# Patient Record
Sex: Male | Born: 1975 | Race: Black or African American | Hispanic: No | Smoking: Current every day smoker
Health system: Southern US, Community
[De-identification: ages and names within clinical notes are randomized; demographics above are authoritative.]

## PROBLEM LIST (undated history)

## (undated) DIAGNOSIS — J45909 Unspecified asthma, uncomplicated: Secondary | ICD-10-CM

## (undated) DIAGNOSIS — F32A Depression, unspecified: Secondary | ICD-10-CM

## (undated) DIAGNOSIS — I10 Essential (primary) hypertension: Secondary | ICD-10-CM

## (undated) DIAGNOSIS — K859 Acute pancreatitis without necrosis or infection, unspecified: Secondary | ICD-10-CM

## (undated) HISTORY — DX: Depression, unspecified: F32.A

## (undated) HISTORY — DX: Unspecified asthma, uncomplicated: J45.909

## (undated) HISTORY — PX: NO PAST SURGERIES: SHX2092

---

## 1999-03-19 ENCOUNTER — Encounter: Payer: Self-pay | Admitting: Emergency Medicine

## 1999-03-19 ENCOUNTER — Emergency Department (HOSPITAL_COMMUNITY): Admission: EM | Admit: 1999-03-19 | Discharge: 1999-03-19 | Payer: Self-pay | Admitting: Emergency Medicine

## 1999-03-30 ENCOUNTER — Emergency Department (HOSPITAL_COMMUNITY): Admission: EM | Admit: 1999-03-30 | Discharge: 1999-03-30 | Payer: Self-pay | Admitting: Endocrinology

## 2000-04-12 ENCOUNTER — Emergency Department (HOSPITAL_COMMUNITY): Admission: EM | Admit: 2000-04-12 | Discharge: 2000-04-12 | Payer: Self-pay | Admitting: Emergency Medicine

## 2003-07-23 ENCOUNTER — Emergency Department (HOSPITAL_COMMUNITY): Admission: EM | Admit: 2003-07-23 | Discharge: 2003-07-23 | Payer: Self-pay | Admitting: Emergency Medicine

## 2005-06-26 ENCOUNTER — Ambulatory Visit: Payer: Self-pay | Admitting: Psychiatry

## 2005-06-26 ENCOUNTER — Inpatient Hospital Stay (HOSPITAL_COMMUNITY): Admission: RE | Admit: 2005-06-26 | Discharge: 2005-06-28 | Payer: Self-pay | Admitting: Psychiatry

## 2005-06-26 ENCOUNTER — Encounter: Payer: Self-pay | Admitting: Emergency Medicine

## 2011-09-25 ENCOUNTER — Emergency Department (HOSPITAL_COMMUNITY): Payer: Self-pay

## 2011-09-25 ENCOUNTER — Encounter: Payer: Self-pay | Admitting: *Deleted

## 2011-09-25 ENCOUNTER — Emergency Department (HOSPITAL_COMMUNITY)
Admission: EM | Admit: 2011-09-25 | Discharge: 2011-09-25 | Disposition: A | Discharge status: Home / Self Care | Payer: Self-pay | Attending: Emergency Medicine | Admitting: Emergency Medicine

## 2011-09-25 ENCOUNTER — Other Ambulatory Visit: Payer: Self-pay

## 2011-09-25 DIAGNOSIS — R109 Unspecified abdominal pain: Secondary | ICD-10-CM | POA: Insufficient documentation

## 2011-09-25 DIAGNOSIS — M549 Dorsalgia, unspecified: Secondary | ICD-10-CM | POA: Insufficient documentation

## 2011-09-25 DIAGNOSIS — R072 Precordial pain: Secondary | ICD-10-CM | POA: Insufficient documentation

## 2011-09-25 DIAGNOSIS — I10 Essential (primary) hypertension: Secondary | ICD-10-CM | POA: Insufficient documentation

## 2011-09-25 HISTORY — DX: Essential (primary) hypertension: I10

## 2011-09-25 LAB — CBC
HCT: 45.5 % (ref 39.0–52.0)
MCH: 28.6 pg (ref 26.0–34.0)
MCV: 82 fL (ref 78.0–100.0)
RBC: 5.55 MIL/uL (ref 4.22–5.81)
WBC: 8.9 10*3/uL (ref 4.0–10.5)

## 2011-09-25 LAB — CARDIAC PANEL(CRET KIN+CKTOT+MB+TROPI): Total CK: 194 U/L (ref 7–232)

## 2011-09-25 LAB — COMPREHENSIVE METABOLIC PANEL
ALT: 40 U/L (ref 0–53)
BUN: 10 mg/dL (ref 6–23)
CO2: 22 mEq/L (ref 19–32)
Calcium: 10.6 mg/dL — ABNORMAL HIGH (ref 8.4–10.5)
Creatinine, Ser: 0.82 mg/dL (ref 0.50–1.35)
GFR calc Af Amer: >90 mL/min (ref >90)
GFR calc non Af Amer: >90 mL/min (ref >90)
Glucose, Bld: 101 mg/dL — ABNORMAL HIGH (ref 70–99)

## 2011-09-25 LAB — DIFFERENTIAL
Eosinophils Relative: 2 % (ref 0–5)
Lymphocytes Relative: 30 % (ref 12–46)
Lymphs Abs: 2.7 10*3/uL (ref 0.7–4.0)
Monocytes Absolute: 0.7 10*3/uL (ref 0.1–1.0)
Monocytes Relative: 8 % (ref 3–12)

## 2011-09-25 LAB — LIPASE, BLOOD: Lipase: 95 U/L — ABNORMAL HIGH (ref 11–59)

## 2011-09-25 LAB — D-DIMER, QUANTITATIVE: D-Dimer, Quant: 0.26 ug/mL-FEU (ref 0.00–0.48)

## 2011-09-25 MED ORDER — PANTOPRAZOLE SODIUM 20 MG PO TBEC: 40.0000 mg | DELAYED_RELEASE_TABLET | Freq: Every day | ORAL | Status: DC

## 2011-09-25 MED ORDER — OXYCODONE-ACETAMINOPHEN 5-325 MG PO TABS: 2.0000 | ORAL_TABLET | ORAL | Status: AC | PRN

## 2011-09-25 MED ORDER — IOHEXOL 300 MG/ML  SOLN: 100.0000 mL | Freq: Once | INTRAMUSCULAR | Status: DC | PRN

## 2011-09-25 MED ORDER — SODIUM CHLORIDE 0.9 % IV SOLN
Freq: Once | INTRAVENOUS | Status: AC
  Administered 2011-09-25: 125 mL via INTRAVENOUS

## 2011-09-25 MED ORDER — HYDROMORPHONE HCL PF 1 MG/ML IJ SOLN
1.0000 mg | Freq: Once | INTRAMUSCULAR | Status: AC
  Administered 2011-09-25: 1 mg via INTRAVENOUS
  Filled 2011-09-25: qty 1

## 2011-09-25 MED ORDER — ONDANSETRON HCL 4 MG/2ML IJ SOLN
4.0000 mg | Freq: Once | INTRAMUSCULAR | Status: AC
  Administered 2011-09-25: 4 mg via INTRAVENOUS
  Filled 2011-09-25: qty 2

## 2011-09-25 NOTE — ED Notes (Signed)
Pt reports midsternal chest pain x 4 days. States feels as if has "knot" inside of middle chest, pain only  with deep breath, lying flat.. Pt denies radiation, shortness of breath, dizziness, nausea, vomiting, diaphoresis. Pain became worse this am while taking shower. CBG 99. Hx of hypertension.

## 2011-09-25 NOTE — ED Provider Notes (Signed)
History     CSN: [id number]  Arrival date & time 09/25/11  0729   First MD Initiated Contact with Patient 09/25/11 [phone]      Chief Complaint  Patient presents with  . Chest Pain    (Consider location/radiation/quality/duration/timing/severity/associated sxs/prior treatment) Patient is a 35 y.o. male presenting with chest pain. The history is provided by the patient.  Chest Pain    patient here with substernal chest pain that has been constant for the last 4 days. Pain is described as sharp, worse with deep breathing and radiates to his back. No syncope noted no neurological complaints. Denies any increased pain with breathing. Denies any fever or cough or leg swelling. No prior history of this in the past. No medications taken prior to arrival. He denies any dyspnea or diaphoresis associated with the symptoms  Past Medical History  Diagnosis Date  . Hypertension     No past surgical history on file.  No family history on file.  History  Substance Use Topics  . Smoking status: Current Everyday Smoker  . Smokeless tobacco: Not on file  . Alcohol Use:       Review of Systems  Cardiovascular: Positive for chest pain.  All other systems reviewed and are negative.    Allergies  Review of patient's allergies indicates no known allergies.  Home Medications  No current outpatient prescriptions on file.  BP 149/102  Pulse 73  Temp(Src) 98.2 F (36.8 C) (Oral)  Resp 16  SpO2 99%  Physical Exam  Nursing note and vitals reviewed. Constitutional: He is oriented to person, place, and time. He appears well-developed and well-nourished.  Non-toxic appearance. No distress.  HENT:  Head: Normocephalic and atraumatic.  Eyes: Conjunctivae, EOM and lids are normal. Pupils are equal, round, and reactive to light.  Neck: Normal range of motion. Neck supple. No tracheal deviation present. No mass present.  Cardiovascular: Normal rate, regular rhythm and normal heart sounds.   Exam reveals no gallop.   No murmur heard. Pulmonary/Chest: Effort normal and breath sounds normal. No stridor. No respiratory distress. He has no decreased breath sounds. He has no wheezes. He has no rhonchi. He has no rales. He exhibits tenderness and bony tenderness. He exhibits no crepitus.    Abdominal: Soft. Normal appearance and bowel sounds are normal. He exhibits no distension. There is no tenderness. There is no rebound and no CVA tenderness.  Musculoskeletal: Normal range of motion. He exhibits no edema and no tenderness.  Neurological: He is alert and oriented to person, place, and time. He has normal strength. No cranial nerve deficit or sensory deficit. GCS eye subscore is 4. GCS verbal subscore is 5. GCS motor subscore is 6.  Skin: Skin is warm and dry. No abrasion and no rash noted.  Psychiatric: He has a normal mood and affect. His speech is normal and behavior is normal.    ED Course  Procedures (including critical care time)   Labs Reviewed  CBC  DIFFERENTIAL  COMPREHENSIVE METABOLIC PANEL  LIPASE, BLOOD  D-DIMER, QUANTITATIVE  CARDIAC PANEL(CRET KIN+CKTOT+MB+TROPI)   No results found.   No diagnosis found.    MDM  Patient given IV fluids and pain medication. No concern for ACS laboratory studies are negative. Patient had elevated lipase abdominal CT negative pancreatitis. Patient does admit to heavy EtOH use. Suspect patient's gastritis. D-dimer negative no concern for PE will place patient on pain medication PPI and discharge        Toy Baker,  MD 09/25/11 1057

## 2012-12-21 IMAGING — CR DG CHEST 2V
2 series · 2 of 2 positions shown · non-contrast
Comparison: None.

CLINICAL DATA: Chest pain

CHEST - 2 VIEW

[w chest pa]
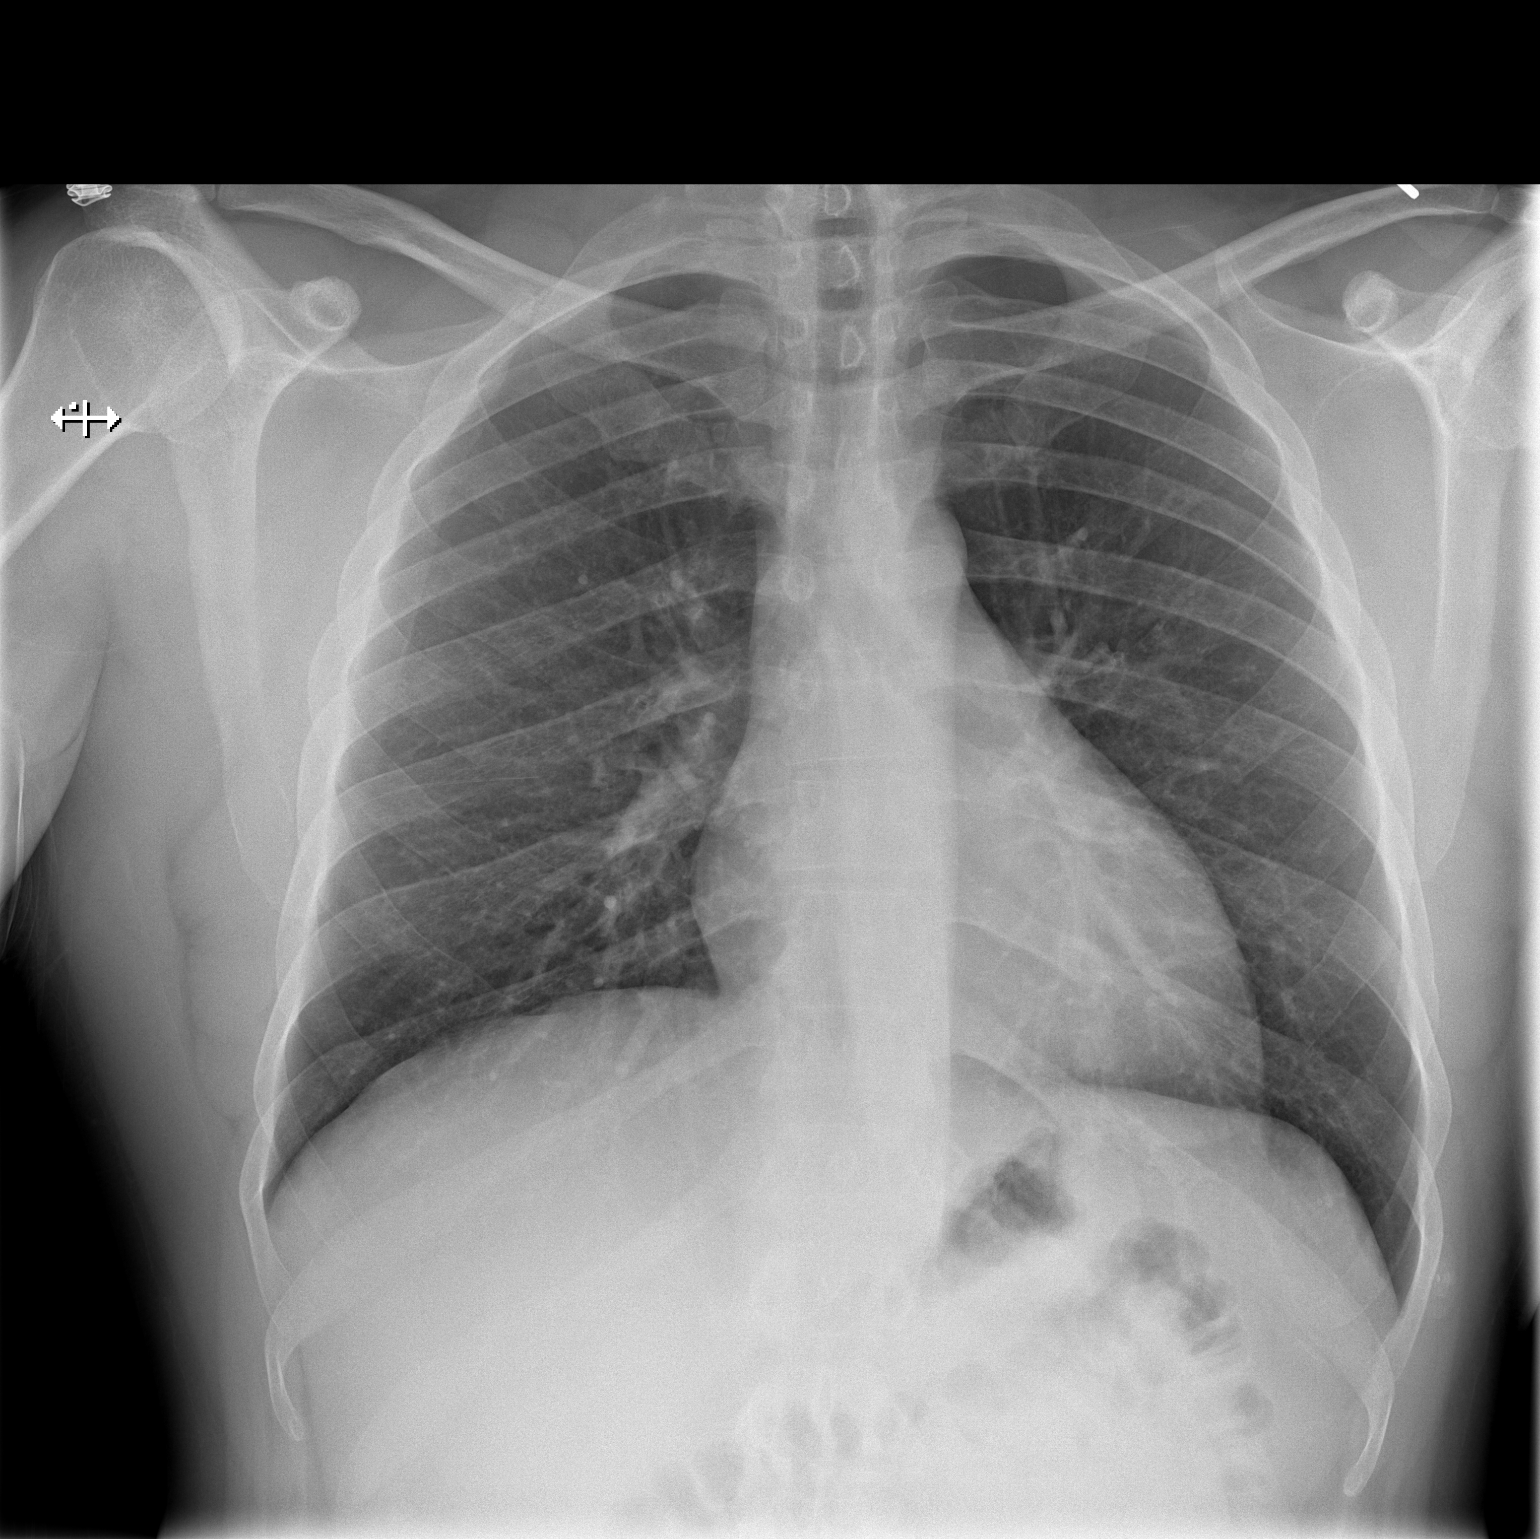

[w chest lat]
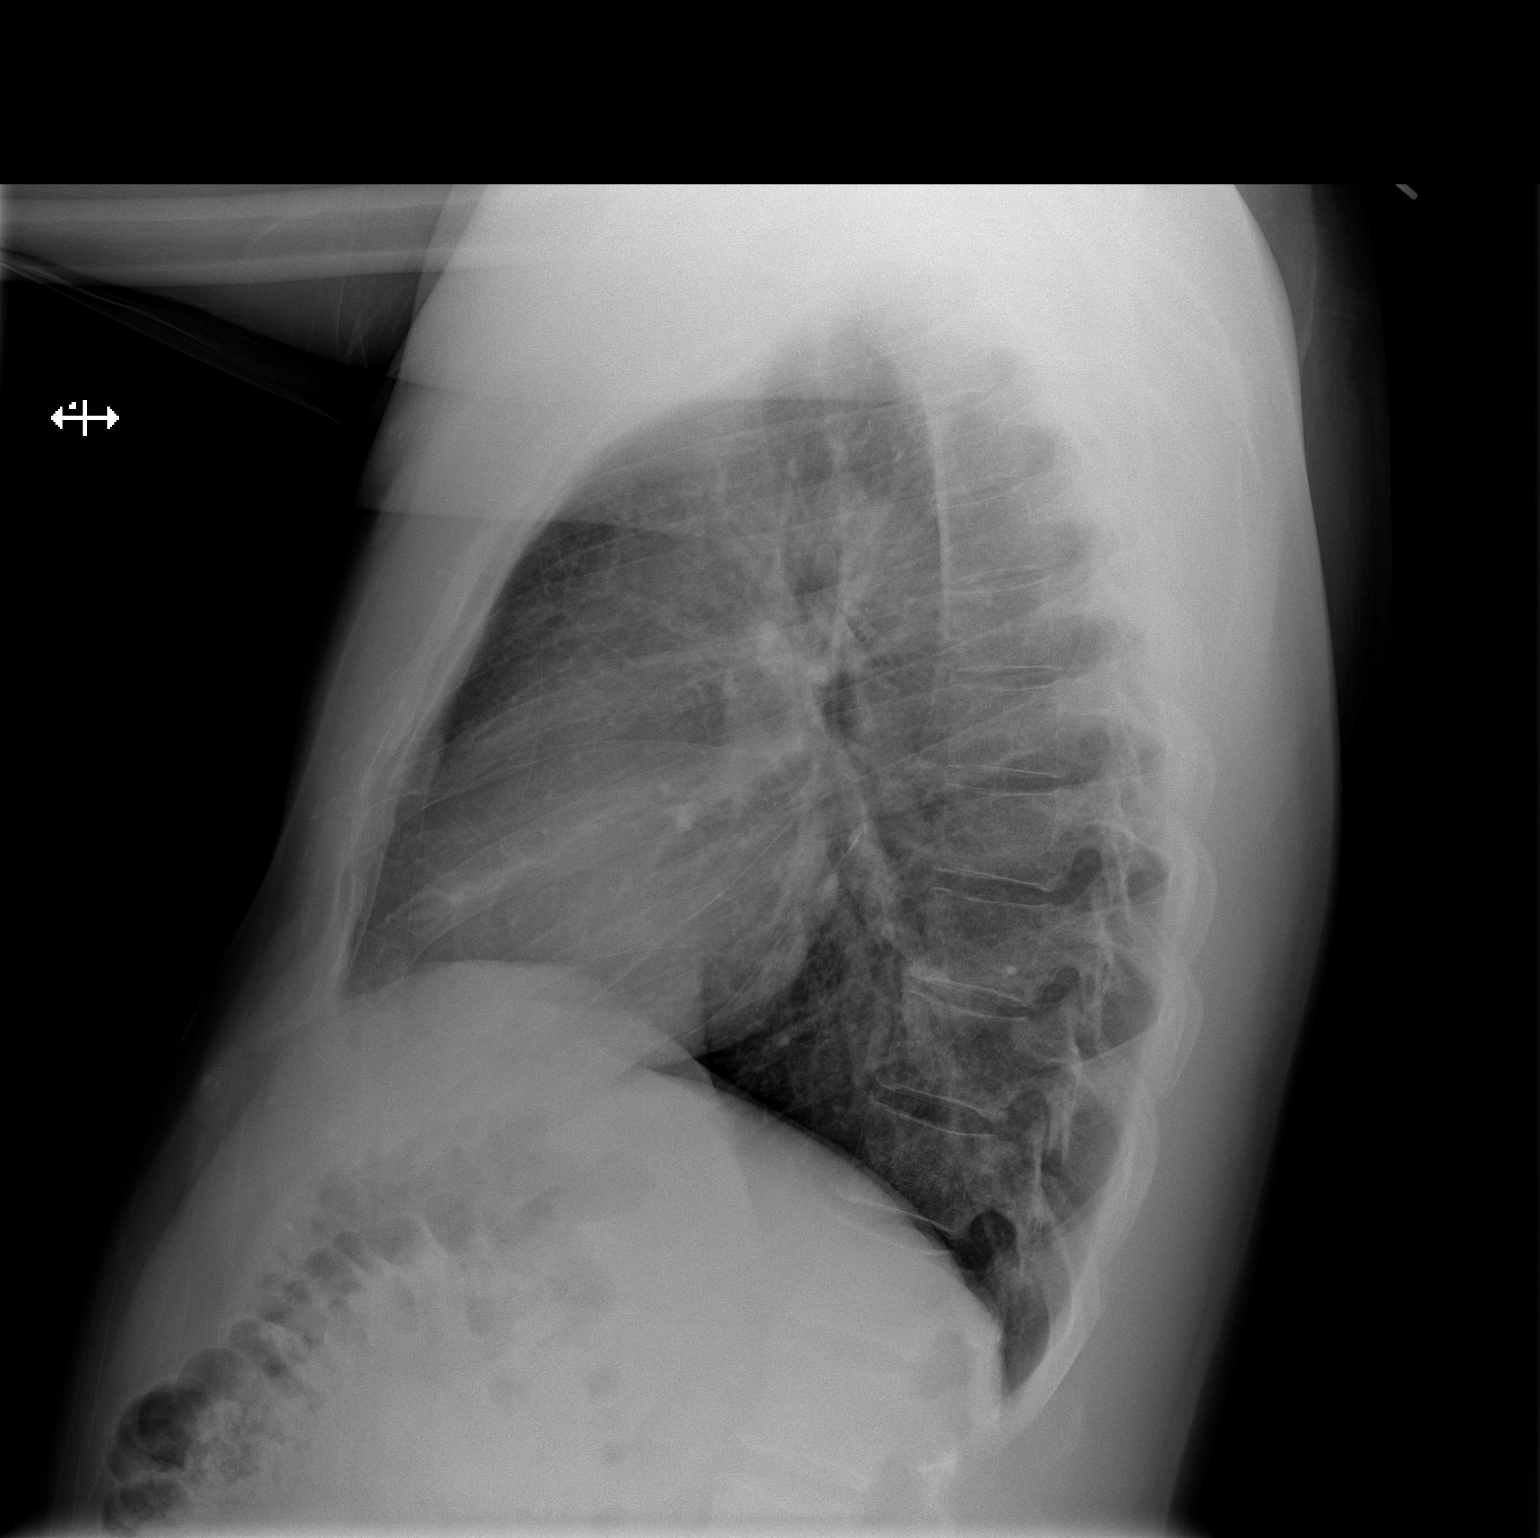

[2 of 2 positions shown; findings below may reference images not displayed]

FINDINGS: The heart size and mediastinal contours are within
normal limits.  Both lungs are clear.  The visualized skeletal
structures are unremarkable.
IMPRESSION: No active cardiopulmonary disease.

## 2012-12-21 IMAGING — CT CT ABD-PELV W/ CM
1 series · 16 of 32 positions shown, 20 images · IV contrast (APPLIED)
Comparison: There

CLINICAL DATA: Chest and abdominal pain

CT ABDOMEN AND PELVIS WITH CONTRAST
TECHNIQUE: Multidetector CT imaging of the abdomen and pelvis was
performed following the standard protocol during bolus
administration of intravenous contrast.
Contrast:  100 ml

[Series 2: abd/pelv with 5.0 b31f st · axial · 0.70mm/px · z∈[-380,+6]mm · 16 of 86 slices shown, 20 images]
[im 6/86  soft-tissue]
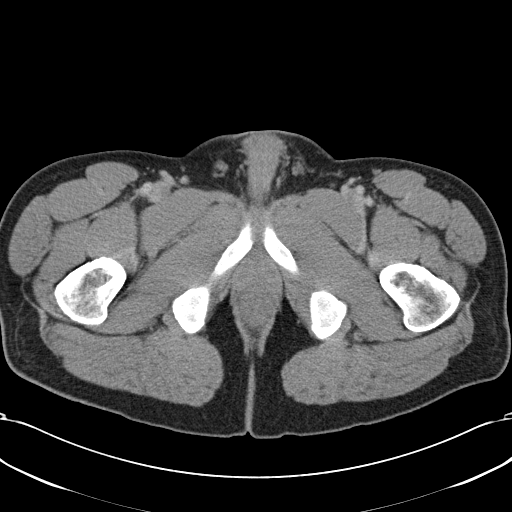
[im 6/86  bone]
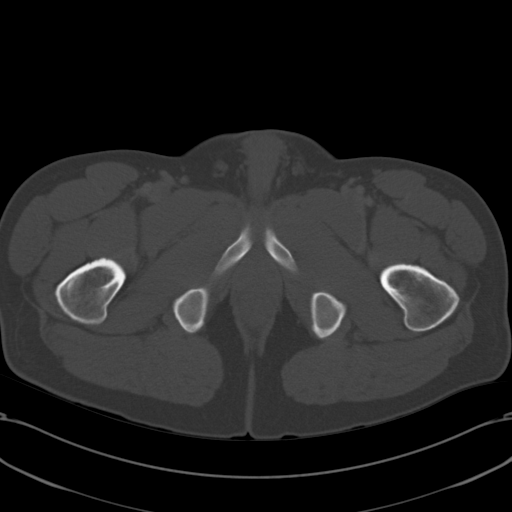
[im 11/86  soft-tissue]
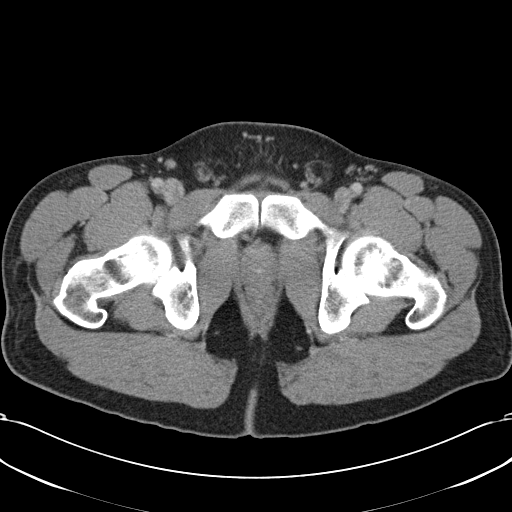
[im 17/86  soft-tissue]
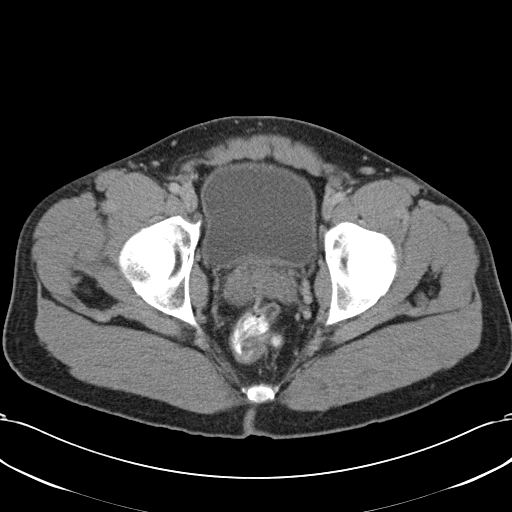
[im 22/86  soft-tissue]
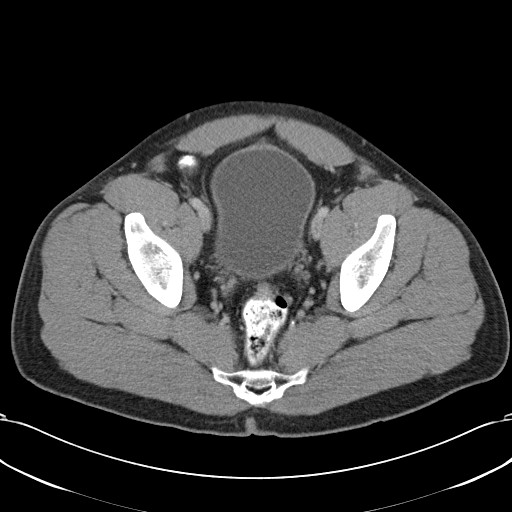
[im 28/86  soft-tissue]
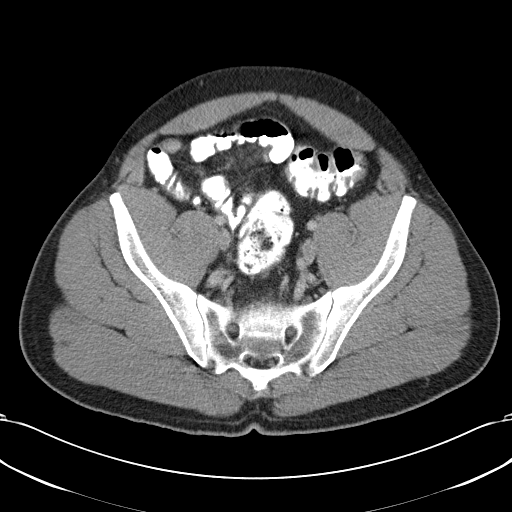
[im 33/86  soft-tissue]
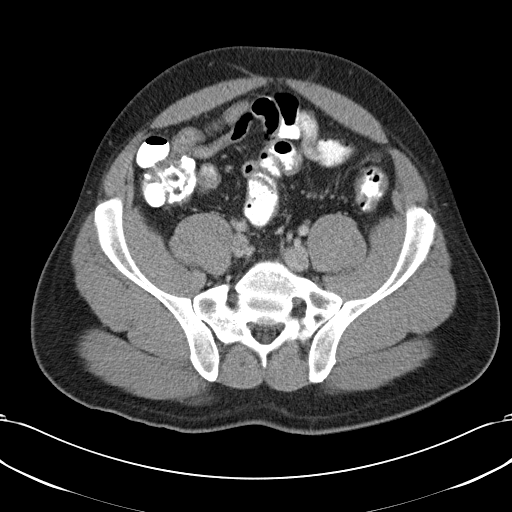
[im 39/86  soft-tissue]
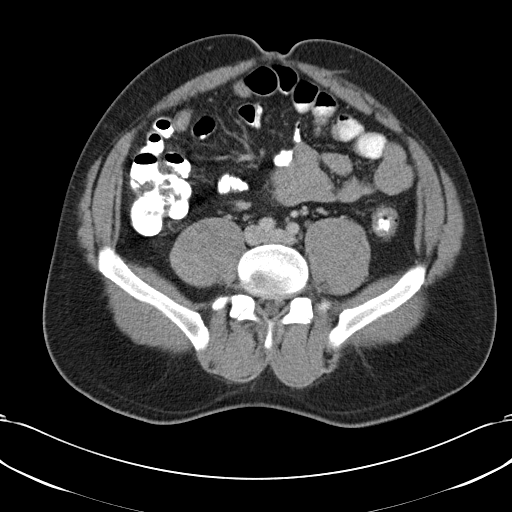
[im 47/86  soft-tissue]
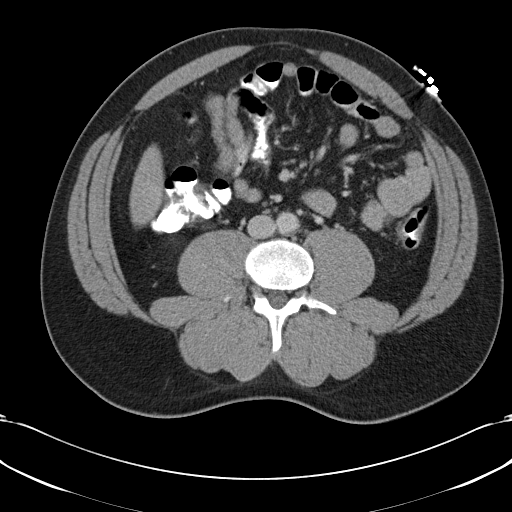
[im 53/86  soft-tissue]
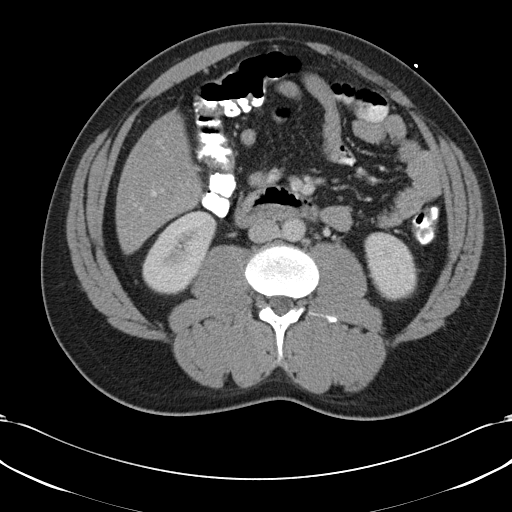
[im 53/86  bone]
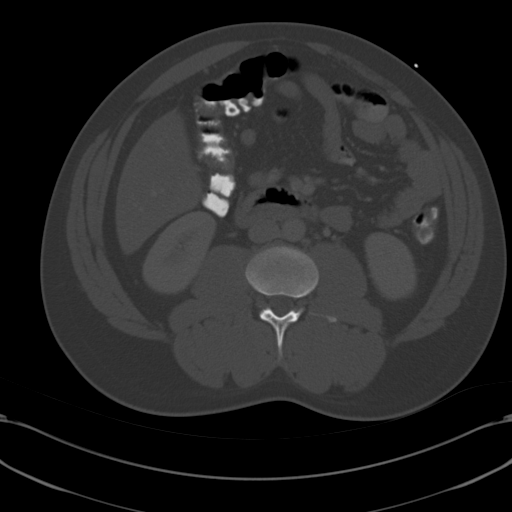
[im 58/86  soft-tissue]
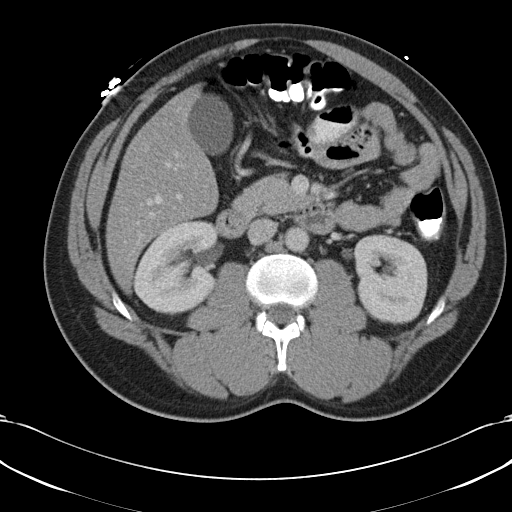
[im 64/86  soft-tissue]
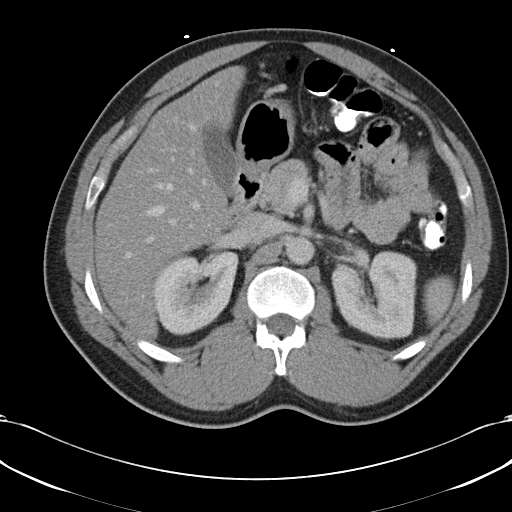
[im 69/86  soft-tissue]
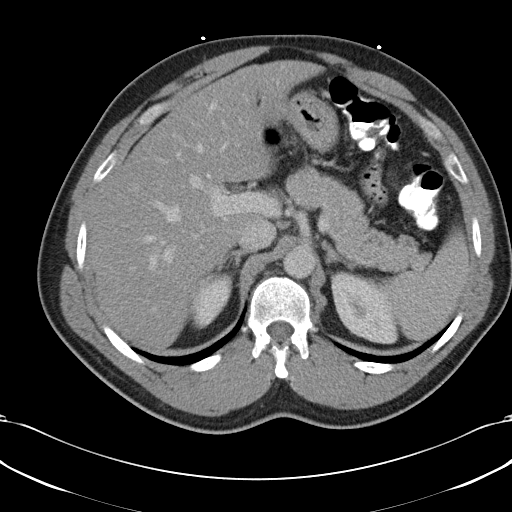
[im 75/86  soft-tissue]
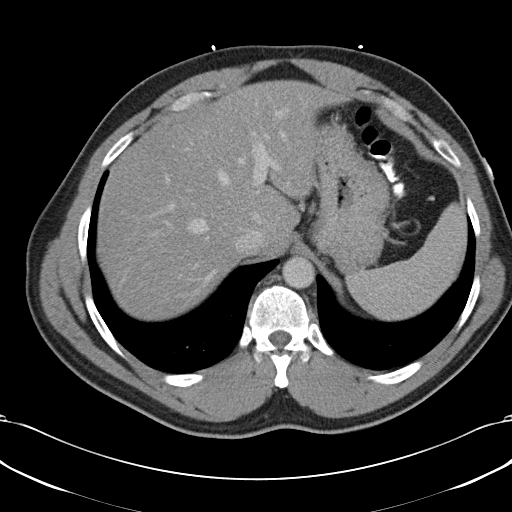
[im 75/86  lung]
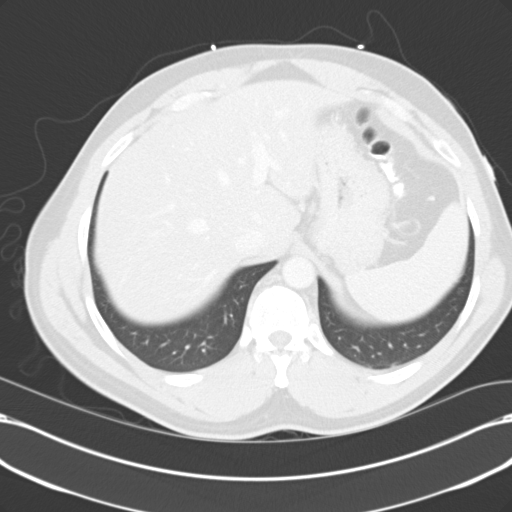
[im 77/86  lung]
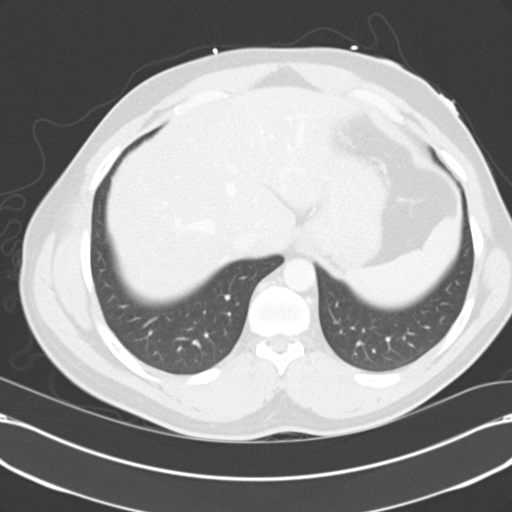
[im 80/86  soft-tissue]
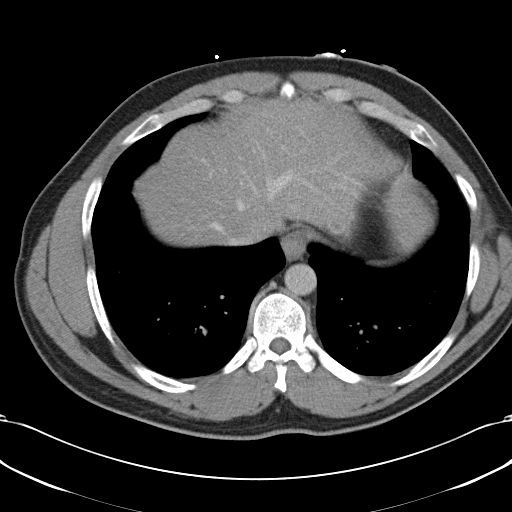
[im 80/86  lung]
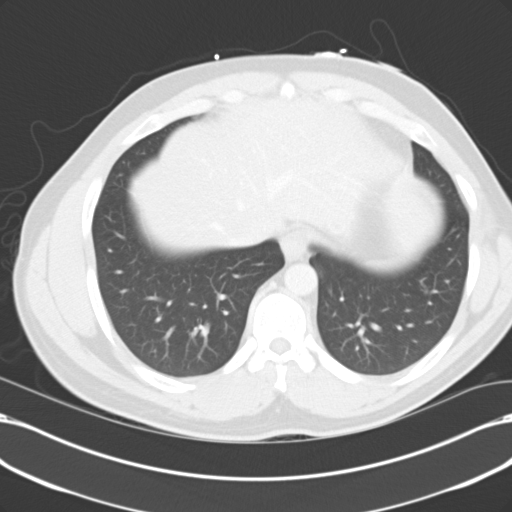
[im 83/86  lung]
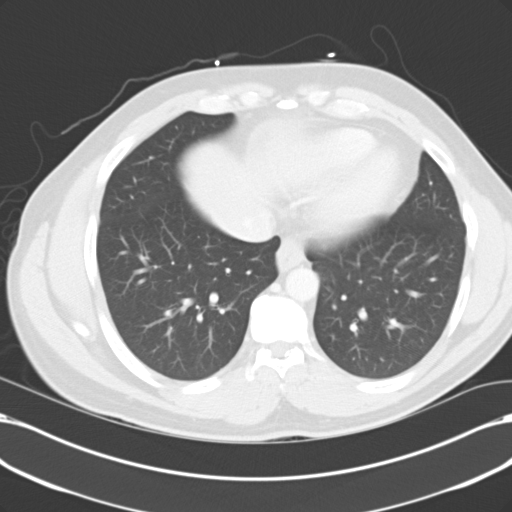

[16 of 32 positions shown; findings below may reference images not displayed]

FINDINGS: Lung bases are clear.  No pleural or pericardial fluid.
The liver shows diffuse fatty change but there is no focal lesion
or biliary ductal dilatation.  No calcified gallstones.  The spleen
is normal.  The pancreas is normal.  The adrenal glands are normal.
The kidneys are normal.  No cyst, mass, stone or hydronephrosis.
The aorta shows mild atherosclerotic change but no aneurysm.  The
IVC is normal.  No retroperitoneal mass or adenopathy.  No free
intraperitoneal fluid or air.  No primary bowel pathology is
discernible.  The appendix appears normal.  Bladder, prostate gland
and seminal vesicles are unremarkable.  No significant osseous
finding.
IMPRESSION: Fatty liver. Early atherosclerotic change of the aorta.  Otherwise
normal scan.

## 2014-02-27 ENCOUNTER — Encounter (HOSPITAL_COMMUNITY): Payer: Self-pay | Admitting: Emergency Medicine

## 2014-02-27 ENCOUNTER — Emergency Department (HOSPITAL_COMMUNITY): Payer: Self-pay

## 2014-02-27 ENCOUNTER — Emergency Department (HOSPITAL_COMMUNITY)
Admission: EM | Admit: 2014-02-27 | Discharge: 2014-02-27 | Disposition: A | Discharge status: Home / Self Care | Payer: Self-pay | Attending: Emergency Medicine | Admitting: Emergency Medicine

## 2014-02-27 DIAGNOSIS — Z8719 Personal history of other diseases of the digestive system: Secondary | ICD-10-CM | POA: Insufficient documentation

## 2014-02-27 DIAGNOSIS — R079 Chest pain, unspecified: Secondary | ICD-10-CM | POA: Insufficient documentation

## 2014-02-27 DIAGNOSIS — Z79899 Other long term (current) drug therapy: Secondary | ICD-10-CM | POA: Insufficient documentation

## 2014-02-27 DIAGNOSIS — I10 Essential (primary) hypertension: Secondary | ICD-10-CM | POA: Insufficient documentation

## 2014-02-27 DIAGNOSIS — F17200 Nicotine dependence, unspecified, uncomplicated: Secondary | ICD-10-CM | POA: Insufficient documentation

## 2014-02-27 HISTORY — DX: Acute pancreatitis without necrosis or infection, unspecified: K85.90

## 2014-02-27 LAB — CBC
HCT: 43.2 % (ref 39.0–52.0)
HEMOGLOBIN: 14.6 g/dL (ref 13.0–17.0)
MCH: 28 pg (ref 26.0–34.0)
MCHC: 33.8 g/dL (ref 30.0–36.0)
MCV: 82.9 fL (ref 78.0–100.0)
PLATELETS: 178 10*3/uL (ref 150–400)
RBC: 5.21 MIL/uL (ref 4.22–5.81)
RDW: 13.2 % (ref 11.5–15.5)
WBC: 8.2 10*3/uL (ref 4.0–10.5)

## 2014-02-27 LAB — BASIC METABOLIC PANEL
BUN: 6 mg/dL (ref 6–23)
CALCIUM: 8.7 mg/dL (ref 8.4–10.5)
CO2: 20 meq/L (ref 19–32)
Chloride: 109 mEq/L (ref 96–112)
Creatinine, Ser: 0.81 mg/dL (ref 0.50–1.35)
GFR calc Af Amer: >90 mL/min (ref >90)
GLUCOSE: 95 mg/dL (ref 70–99)
POTASSIUM: 4.3 meq/L (ref 3.7–5.3)
SODIUM: 147 meq/L (ref 137–147)

## 2014-02-27 LAB — I-STAT TROPONIN, ED
TROPONIN I, POC: 0.01 ng/mL (ref 0.00–0.08)
TROPONIN I, POC: 0.01 ng/mL (ref 0.00–0.08)

## 2014-02-27 NOTE — ED Provider Notes (Signed)
CSN: [id number]     Arrival date & time 02/27/14  [id number] History   First MD Initiated Contact with Patient 02/27/14 [phone]     Chief Complaint  Patient presents with  . Chest Pain    (Consider location/radiation/quality/duration/timing/severity/associated sxs/prior Treatment) HPI Comments: Patient with history of hypertension presents with complaint of chest pain which began approximately 2 hours prior to arrival. Pain began acutely during an argument. Patient describes the pain as a sharp pain in the left side of his chest with associated pressure. Patient had some tingling in his left hand as well that was short-lived. EMS was called and patient was transported to the hospital. He received one nitroglycerin and 324 mg of aspirin prior to arrival. Pain improved and is now resolved. Patient denies associated shortness of breath, nausea, diaphoresis, palpitations. No history of hyperlipidemia, diabetes. Patient is a smoker. He denies cocaine or other stimulant use. No known family history of CAD or MI. DM runs in the family. Nothing makes symptoms better or worse.  Patient is a 38 y.o. male presenting with chest pain. The history is provided by the patient and medical records.  Chest Pain Associated symptoms: no abdominal pain, no back pain, no cough, no diaphoresis, no fever, no nausea, no palpitations, no shortness of breath and not vomiting     Past Medical History  Diagnosis Date  . Hypertension   . Pancreatitis    History reviewed. No pertinent past surgical history. History reviewed. No pertinent family history. History  Substance Use Topics  . Smoking status: Current Every Day Smoker    Types: Cigarettes  . Smokeless tobacco: Not on file  . Alcohol Use: Yes    Review of Systems  Constitutional: Negative for fever and diaphoresis.  Eyes: Negative for redness.  Respiratory: Negative for cough and shortness of breath.   Cardiovascular: Positive for chest pain. Negative for  palpitations and leg swelling.  Gastrointestinal: Negative for nausea, vomiting and abdominal pain.  Genitourinary: Negative for dysuria.  Musculoskeletal: Negative for back pain and neck pain.  Skin: Negative for rash.  Neurological: Negative for syncope and light-headedness.    Allergies  Review of patient's allergies indicates no known allergies.  Home Medications   Prior to Admission medications   Medication Sig Start Date End Date Taking? Authorizing Provider  pantoprazole (PROTONIX) 20 MG tablet Take 2 tablets (40 mg total) by mouth daily. 09/25/11 09/24/12  Toy BakerAnthony T Allen, MD   BP 136/92  Pulse 91  Temp(Src) 98.4 F (36.9 C) (Oral)  Resp 19  SpO2 98%  Physical Exam  Nursing note and vitals reviewed. Constitutional: He appears well-developed and well-nourished.  HENT:  Head: Normocephalic and atraumatic.  Mouth/Throat: Mucous membranes are normal. Mucous membranes are not dry.  Eyes: Conjunctivae are normal.  Neck: Trachea normal and normal range of motion. Neck supple. Normal carotid pulses and no JVD present. No muscular tenderness present. Carotid bruit is not present. No tracheal deviation present.  Cardiovascular: Normal rate, regular rhythm, S1 normal, S2 normal, normal heart sounds and intact distal pulses.  Exam reveals no distant heart sounds and no decreased pulses.   No murmur heard. Pulmonary/Chest: Effort normal and breath sounds normal. No respiratory distress. He has no wheezes. He exhibits no tenderness.  Abdominal: Soft. Normal aorta and bowel sounds are normal. There is no tenderness. There is no rebound and no guarding.  Musculoskeletal: He exhibits no edema.  Neurological: He is alert.  Skin: Skin is warm and dry. He is not  diaphoretic. No cyanosis. No pallor.  Psychiatric: He has a normal mood and affect.    ED Course  Procedures (including critical care time) Labs Review Labs Reviewed  CBC  BASIC METABOLIC PANEL  I-STAT TROPOININ, ED   Rosezena Sensor, ED    Imaging Review Dg Chest 2 View  02/27/2014   CLINICAL DATA:  Left-sided chest pain  EXAM: CHEST  2 VIEW  COMPARISON:  09/25/2011  FINDINGS: The heart size and mediastinal contours are within normal limits. Both lungs are clear. The visualized skeletal structures are unremarkable.  IMPRESSION: No active cardiopulmonary disease.   Electronically Signed   By: Elige Ko   On: 02/27/2014 09:19     EKG Interpretation   Date/Time:  Sunday Feb 27 2014 08:17:47 EDT Ventricular Rate:  86 PR Interval:  134 QRS Duration: 93 QT Interval:  354 QTC Calculation: 423 R Axis:   85 Text Interpretation:  Sinus rhythm ST elev, probable normal early repol  pattern No significant change was found Confirmed by CAMPOS  MD, KEVIN  ([id number]) on 02/27/2014 8:42:12 AM      8:34 AM Patient seen and examined. Work-up initiated. ASA given prior. EKG reviewed.   Vital signs reviewed and are as follows: Filed Vitals:   02/27/14 0819  BP: 136/92  Pulse: 91  Temp: 98.4 F (36.9 C)  Resp: 19   Patient stable during ED stay. Trop neg x 2. No return of CP. Doubt cardiac etiology.   Patient was counseled to return with severe chest pain, especially if the pain is crushing or pressure-like and spreads to the arms, back, neck, or jaw, or if they have sweating, nausea, or shortness of breath with the pain. They were encouraged to call 911 with these symptoms.   They were also told to return if their chest pain gets worse and does not go away with rest, they have an attack of chest pain lasting longer than usual despite rest and treatment with the medications their caregiver has prescribed, if they wake from sleep with chest pain or shortness of breath, if they feel dizzy or faint, if they have chest pain not typical of their usual pain, or if they have any other emergent concerns regarding their health.  The patient verbalized understanding and agreed.     MDM   Final diagnoses:  Chest  pain   Patient with chest tightness during an argument. Resolved shortly after arrival. CXR clear. Feel patient is low risk for ACS given history (poor story for ACS/MI), negative troponin(s) x2, normal/unchanged EKG. Do not suspect PE. PCP referral given and patient encouraged to followup. Appropriate return instructions discussed with patient and he agrees and seems reliable to return if worsening.  No dangerous or life-threatening conditions suspected or identified by history, physical exam, and by work-up. No indications for hospitalization identified.       Renne Crigler, PA-C 02/27/14 1226

## 2014-02-27 NOTE — ED Provider Notes (Signed)
Medical screening examination/treatment/procedure(s) were performed by non-physician practitioner and as supervising physician I was immediately available for consultation/collaboration.   EKG Interpretation   Date/Time:  Sunday Feb 27 2014 08:17:47 EDT Ventricular Rate:  86 PR Interval:  134 QRS Duration: 93 QT Interval:  354 QTC Calculation: 423 R Axis:   85 Text Interpretation:  Sinus rhythm ST elev, probable normal early repol  pattern No significant change was found Confirmed by Harrold Fitchett  MD, Nahara Dona  ([id number]) on 02/27/2014 8:42:12 AM        Lyanne Co, MD 02/27/14 1343

## 2014-02-27 NOTE — ED Notes (Signed)
Pt having argument with gf. Developed sudden onset CP. No nausea/vomiting/diaphoresis. EMS gave 1 nitro and 324 ASA with relief. Pain went from 7 to a 3/10. BP 160/100 and now is 136/94. ST HR 100's.

## 2014-02-27 NOTE — Discharge Instructions (Signed)
Please read and follow all provided instructions.  Your diagnoses today include:  1. Chest pain     Tests performed today include:  An EKG of your heart  A chest x-ray  Cardiac enzymes - a blood test for heart muscle damage  Blood counts and electrolytes  Vital signs. See below for your results today.   Medications prescribed:   None  Take any prescribed medications only as directed.  Follow-up instructions: Please follow-up with your primary care provider as soon as you can for further evaluation of your symptoms. If you do not have a primary care doctor -- see below for referral information.   Return instructions:  SEEK IMMEDIATE MEDICAL ATTENTION IF:  You have severe chest pain, especially if the pain is crushing or pressure-like and spreads to the arms, back, neck, or jaw, or if you have sweating, nausea (feeling sick to your stomach), or shortness of breath. THIS IS AN EMERGENCY. Don't wait to see if the pain will go away. Get medical help at once. Call 911 or 0 (operator). DO NOT drive yourself to the hospital.   Your chest pain gets worse and does not go away with rest.   You have an attack of chest pain lasting longer than usual, despite rest and treatment with the medications your caregiver has prescribed.   You wake from sleep with chest pain or shortness of breath.  You feel dizzy or faint.  You have chest pain not typical of your usual pain for which you originally saw your caregiver.   You have any other emergent concerns regarding your health.  Additional Information: Chest pain comes from many different causes. Your caregiver has diagnosed you as having chest pain that is not specific for one problem, but does not require admission.  You are at low risk for an acute heart condition or other serious illness.   Your vital signs today were: BP 142/87   Pulse 89   Temp(Src) 98.4 F (36.9 C) (Oral)   Resp 19   SpO2 98% If your blood pressure (BP) was  elevated above 135/85 this visit, please have this repeated by your doctor within one month. --------------

## 2019-05-04 ENCOUNTER — Emergency Department (HOSPITAL_COMMUNITY)
Admission: EM | Admit: 2019-05-04 | Discharge: 2019-05-04 | Disposition: A | Discharge status: Home / Self Care | Payer: Self-pay | Attending: Emergency Medicine | Admitting: Emergency Medicine

## 2019-05-04 ENCOUNTER — Other Ambulatory Visit: Payer: Self-pay

## 2019-05-04 ENCOUNTER — Encounter (HOSPITAL_COMMUNITY): Payer: Self-pay

## 2019-05-04 DIAGNOSIS — R112 Nausea with vomiting, unspecified: Secondary | ICD-10-CM

## 2019-05-04 DIAGNOSIS — F17210 Nicotine dependence, cigarettes, uncomplicated: Secondary | ICD-10-CM | POA: Insufficient documentation

## 2019-05-04 DIAGNOSIS — R1084 Generalized abdominal pain: Secondary | ICD-10-CM | POA: Insufficient documentation

## 2019-05-04 DIAGNOSIS — I10 Essential (primary) hypertension: Secondary | ICD-10-CM | POA: Insufficient documentation

## 2019-05-04 DIAGNOSIS — R1013 Epigastric pain: Secondary | ICD-10-CM | POA: Insufficient documentation

## 2019-05-04 LAB — COMPREHENSIVE METABOLIC PANEL
ALT: 132 U/L — ABNORMAL HIGH (ref 0–44)
AST: 89 U/L — ABNORMAL HIGH (ref 15–41)
Albumin: 4.8 g/dL (ref 3.5–5.0)
Alkaline Phosphatase: 93 U/L (ref 38–126)
Anion gap: 17 — ABNORMAL HIGH (ref 5–15)
BUN: 14 mg/dL (ref 6–20)
CO2: 23 mmol/L (ref 22–32)
Calcium: 10.6 mg/dL — ABNORMAL HIGH (ref 8.9–10.3)
Chloride: 96 mmol/L — ABNORMAL LOW (ref 98–111)
Creatinine, Ser: 0.83 mg/dL (ref 0.61–1.24)
GFR calc Af Amer: >60 mL/min (ref >60)
GFR calc non Af Amer: >60 mL/min (ref >60)
Glucose, Bld: 119 mg/dL — ABNORMAL HIGH (ref 70–99)
Potassium: 4.6 mmol/L (ref 3.5–5.1)
Sodium: 136 mmol/L (ref 135–145)
Total Bilirubin: 1.3 mg/dL — ABNORMAL HIGH (ref 0.3–1.2)
Total Protein: 9 g/dL — ABNORMAL HIGH (ref 6.5–8.1)

## 2019-05-04 LAB — CBC
HCT: 51.3 % (ref 39.0–52.0)
Hemoglobin: 16.6 g/dL (ref 13.0–17.0)
MCH: 28.4 pg (ref 26.0–34.0)
MCHC: 32.4 g/dL (ref 30.0–36.0)
MCV: 87.7 fL (ref 80.0–100.0)
Platelets: 191 10*3/uL (ref 150–400)
RBC: 5.85 MIL/uL — ABNORMAL HIGH (ref 4.22–5.81)
RDW: 13.4 % (ref 11.5–15.5)
WBC: 12.5 10*3/uL — ABNORMAL HIGH (ref 4.0–10.5)
nRBC: 0 % (ref 0.0–0.2)

## 2019-05-04 LAB — URINALYSIS, ROUTINE W REFLEX MICROSCOPIC
Bilirubin Urine: NEGATIVE
Glucose, UA: NEGATIVE mg/dL
Ketones, ur: 80 mg/dL — AB
Leukocytes,Ua: NEGATIVE
Nitrite: NEGATIVE
Protein, ur: >=300 mg/dL — AB
Specific Gravity, Urine: 1.035 — ABNORMAL HIGH (ref 1.005–1.030)
pH: 6 (ref 5.0–8.0)

## 2019-05-04 LAB — LIPASE, BLOOD: Lipase: 35 U/L (ref 11–51)

## 2019-05-04 MED ORDER — ONDANSETRON 8 MG PO TBDP
8.0000 mg | ORAL_TABLET | Freq: Once | ORAL | Status: AC
  Administered 2019-05-04: 14:00:00 8 mg via ORAL
  Filled 2019-05-04: qty 1

## 2019-05-04 MED ORDER — SODIUM CHLORIDE 0.9% FLUSH: 3.0000 mL | Freq: Once | INTRAVENOUS | Status: DC

## 2019-05-04 MED ORDER — LACTATED RINGERS IV BOLUS
2000.0000 mL | Freq: Once | INTRAVENOUS | Status: AC
  Administered 2019-05-04: 14:00:00 2000 mL via INTRAVENOUS

## 2019-05-04 MED ORDER — ONDANSETRON 8 MG PO TBDP: 8.0000 mg | ORAL_TABLET | Freq: Three times a day (TID) | ORAL | 0 refills | Status: DC | PRN

## 2019-05-04 NOTE — ED Provider Notes (Signed)
[facility]-EMERGENCY DEPT Provider Note   CSN: [id number] Arrival date & time: 05/04/19  1000     History   Chief Complaint Chief Complaint  Patient presents with  . Abdominal Pain    HPI Travis Leach is a 43 y.o. male.     HPI  43 year old male comes in a chief complaint of abdominal pain. Patient has history of hypertension, pancreatitis.  He also has history of heavy alcohol use per wife.  He comes in with chief complaint of nausea and vomiting that started yesterday.  He had associated abdominal discomfort that is located mostly in the epigastric region.  The pain is intermittent.  Patient has had nausea without vomiting.  He denies any diarrhea, cough, chest pain.  He states that his symptoms started 2 days after him and his wife ate outside.  She too has nausea but it is not as severe.  Patient gets emesis anytime he eats anything.  He denies any sick exposures.  Past Medical History:  Diagnosis Date  . Hypertension   . Pancreatitis     There are no active problems to display for this patient.   History reviewed. No pertinent surgical history.      Home Medications    Prior to Admission medications   Medication Sig Start Date End Date Taking? Authorizing Provider  ibuprofen (ADVIL) 200 MG tablet Take 400 mg by mouth every 6 (six) hours as needed for moderate pain.   Yes [provider]  ondansetron (ZOFRAN ODT) 8 MG disintegrating tablet Take 1 tablet (8 mg total) by mouth every 8 (eight) hours as needed for nausea. 05/04/19   Derwood KaplanNanavati, Jesseka Drinkard, MD  pantoprazole (PROTONIX) 20 MG tablet Take 2 tablets (40 mg total) by mouth daily. Patient not taking: Reported on 05/04/2019 09/25/11 09/24/12  Lorre NickAllen, Anthony, MD    Family History No family history on file.  Social History Social History   Tobacco Use  . Smoking status: Current Every Day Smoker    Types: Cigarettes  Substance Use Topics  . Alcohol use: Yes  . Drug use: Yes   Types: Marijuana     Allergies   Patient has no known allergies.   Review of Systems Review of Systems  Constitutional: Positive for activity change.  Respiratory: Negative for shortness of breath.   Cardiovascular: Negative for chest pain.  Gastrointestinal: Positive for abdominal pain, nausea and vomiting. Negative for diarrhea.  Allergic/Immunologic: Negative for immunocompromised state.  Hematological: Does not bruise/bleed easily.     Physical Exam Updated Vital Signs BP (!) 187/123   Pulse 100   Temp 99.2 F (37.3 C) (Oral)   Resp 16   Wt 68.5 kg   SpO2 97%   Physical Exam Vitals signs and nursing note reviewed.  Constitutional:      Appearance: He is well-developed.  HENT:     Head: Atraumatic.  Neck:     Musculoskeletal: Neck supple.  Cardiovascular:     Rate and Rhythm: Normal rate.  Pulmonary:     Effort: Pulmonary effort is normal.  Abdominal:     Palpations: Abdomen is soft.     Tenderness: There is generalized abdominal tenderness and tenderness in the epigastric area, periumbilical area and left upper quadrant. There is no guarding or rebound. Negative signs include Murphy's sign and McBurney's sign.  Skin:    General: Skin is warm.  Neurological:     Mental Status: He is alert and oriented to person, place, and time.  ED Treatments / Results  Labs (all labs ordered are listed, but only abnormal results are displayed) Labs Reviewed  COMPREHENSIVE METABOLIC PANEL - Abnormal; Notable for the following components:      Result Value   Chloride 96 (*)    Glucose, Bld 119 (*)    Calcium 10.6 (*)    Total Protein 9.0 (*)    AST 89 (*)    ALT 132 (*)    Total Bilirubin 1.3 (*)    Anion gap 17 (*)    All other components within normal limits  CBC - Abnormal; Notable for the following components:   WBC 12.5 (*)    RBC 5.85 (*)    All other components within normal limits  URINALYSIS, ROUTINE W REFLEX MICROSCOPIC - Abnormal; Notable for  the following components:   Color, Urine AMBER (*)    APPearance HAZY (*)    Specific Gravity, Urine 1.035 (*)    Hgb urine dipstick MODERATE (*)    Ketones, ur 80 (*)    Protein, ur >=300 (*)    Bacteria, UA RARE (*)    All other components within normal limits  NOVEL CORONAVIRUS, NAA (HOSPITAL ORDER, SEND-OUT TO REF LAB)  LIPASE, BLOOD    EKG None  Radiology No results found.  Procedures Procedures (including critical care time)  Medications Ordered in ED Medications  lactated ringers bolus 2,000 mL (2,000 mLs Intravenous New Bag/Given 05/04/19 1340)  ondansetron (ZOFRAN-ODT) disintegrating tablet 8 mg (8 mg Oral Given 05/04/19 1339)     Initial Impression / Assessment and Plan / ED Course  I have reviewed the triage vital signs and the nursing notes.  Pertinent labs & imaging results that were available during my care of the patient were reviewed by me and considered in my medical decision making (see chart for details).        43 year old comes in a chief complaint of nausea, vomiting, abdominal pain.  Symptoms have been going on for 3 days.  He only has emesis after he eats.  He is having diffuse tenderness without any diarrhea.  No COVID-19-like symptoms besides the GI issues.  Unclear why he is having the symptoms.  LFTs are slightly elevated.  I ordered COVID-19 test, patient declined.  Wife informs me that patient does drink heavily, which could have contributed to the elevated LFTs.  Patient has passed oral challenge. He has some ketonuria but otherwise no signs of advanced dehydration.   Travis Leach was evaluated in Emergency Department on 05/04/2019 for the symptoms described in the history of present illness. He was evaluated in the context of the global COVID-19 pandemic, which necessitated consideration that the patient might be at risk for infection with the SARS-CoV-2 virus that causes COVID-19. Institutional protocols and algorithms that pertain to the  evaluation of patients at risk for COVID-19 are in a state of rapid change based on information released by regulatory bodies including the CDC and federal and state organizations. These policies and algorithms were followed during the patient's care in the ED.   Final Clinical Impressions(s) / ED Diagnoses   Final diagnoses:  Generalized abdominal pain  Nausea and vomiting, intractability of vomiting not specified, unspecified vomiting type    ED Discharge Orders         Ordered    ondansetron (ZOFRAN ODT) 8 MG disintegrating tablet  Every 8 hours PRN     05/04/19 1535           Guhan Bruington,  MD 05/04/19 1603

## 2019-05-04 NOTE — ED Notes (Signed)
Patient unable to give urine sample at this time

## 2019-05-04 NOTE — ED Notes (Signed)
Visitor at bedside.

## 2019-05-04 NOTE — ED Notes (Signed)
Fluid challenge- PO fluid provided; pt tolerated well

## 2019-05-04 NOTE — ED Notes (Signed)
Pt refusing COVID test, this nurse notified EDP. No new orders.

## 2019-05-04 NOTE — ED Notes (Signed)
Pt informs this nurse he is suppose to take bp medication but is non compliant- this nurse notified EDP

## 2019-05-04 NOTE — ED Notes (Signed)
EDP at bedside  

## 2019-05-04 NOTE — ED Notes (Signed)
Pt d/c home per MD order. Discharge summary reviewed, pt verbalizes understanding. No s/s of acute distress noted. Pt ambulatory off unit with wife.

## 2019-05-04 NOTE — ED Triage Notes (Signed)
Pt states that he has had nausea and diarrhea since Sunday. Denies fever, cough, SHOB, CP. Pt describes generalized abd pain

## 2020-01-24 ENCOUNTER — Ambulatory Visit (HOSPITAL_COMMUNITY)
Admission: AD | Admit: 2020-01-24 | Discharge: 2020-01-24 | Disposition: A | Discharge status: Home / Self Care | Payer: BC Managed Care – PPO | Attending: Psychiatry | Admitting: Psychiatry

## 2020-01-24 ENCOUNTER — Encounter (HOSPITAL_COMMUNITY): Payer: Self-pay | Admitting: Psychiatry

## 2020-01-24 DIAGNOSIS — F1020 Alcohol dependence, uncomplicated: Secondary | ICD-10-CM | POA: Insufficient documentation

## 2020-01-24 DIAGNOSIS — Z5329 Procedure and treatment not carried out because of patient's decision for other reasons: Secondary | ICD-10-CM | POA: Diagnosis not present

## 2020-01-24 DIAGNOSIS — R451 Restlessness and agitation: Secondary | ICD-10-CM | POA: Diagnosis present

## 2020-01-24 DIAGNOSIS — R45851 Suicidal ideations: Secondary | ICD-10-CM | POA: Insufficient documentation

## 2020-01-24 DIAGNOSIS — Z915 Personal history of self-harm: Secondary | ICD-10-CM | POA: Insufficient documentation
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
## 2021-12-13 ENCOUNTER — Inpatient Hospital Stay
Admission: EM | Admit: 2021-12-13 | Discharge: 2021-12-16 | DRG: 917 | Disposition: A | Discharge status: Home / Self Care | Payer: Self-pay | Attending: Internal Medicine | Admitting: Internal Medicine

## 2021-12-13 ENCOUNTER — Other Ambulatory Visit: Payer: Self-pay

## 2021-12-13 DIAGNOSIS — E871 Hypo-osmolality and hyponatremia: Secondary | ICD-10-CM

## 2021-12-13 DIAGNOSIS — F1210 Cannabis abuse, uncomplicated: Secondary | ICD-10-CM | POA: Diagnosis present

## 2021-12-13 DIAGNOSIS — F14129 Cocaine abuse with intoxication, unspecified: Secondary | ICD-10-CM | POA: Diagnosis present

## 2021-12-13 DIAGNOSIS — D6959 Other secondary thrombocytopenia: Secondary | ICD-10-CM | POA: Diagnosis present

## 2021-12-13 DIAGNOSIS — G928 Other toxic encephalopathy: Secondary | ICD-10-CM | POA: Diagnosis present

## 2021-12-13 DIAGNOSIS — F17200 Nicotine dependence, unspecified, uncomplicated: Secondary | ICD-10-CM | POA: Diagnosis present

## 2021-12-13 DIAGNOSIS — F10930 Alcohol use, unspecified with withdrawal, uncomplicated: Principal | ICD-10-CM

## 2021-12-13 DIAGNOSIS — D61818 Other pancytopenia: Secondary | ICD-10-CM

## 2021-12-13 DIAGNOSIS — F17210 Nicotine dependence, cigarettes, uncomplicated: Secondary | ICD-10-CM | POA: Diagnosis present

## 2021-12-13 DIAGNOSIS — F10931 Alcohol use, unspecified with withdrawal delirium: Secondary | ICD-10-CM

## 2021-12-13 DIAGNOSIS — K760 Fatty (change of) liver, not elsewhere classified: Secondary | ICD-10-CM | POA: Diagnosis present

## 2021-12-13 DIAGNOSIS — F1010 Alcohol abuse, uncomplicated: Secondary | ICD-10-CM | POA: Diagnosis present

## 2021-12-13 DIAGNOSIS — R7989 Other specified abnormal findings of blood chemistry: Secondary | ICD-10-CM

## 2021-12-13 DIAGNOSIS — T405X1A Poisoning by cocaine, accidental (unintentional), initial encounter: Principal | ICD-10-CM | POA: Diagnosis present

## 2021-12-13 DIAGNOSIS — F10231 Alcohol dependence with withdrawal delirium: Secondary | ICD-10-CM | POA: Diagnosis present

## 2021-12-13 DIAGNOSIS — E876 Hypokalemia: Secondary | ICD-10-CM | POA: Diagnosis present

## 2021-12-13 DIAGNOSIS — I10 Essential (primary) hypertension: Secondary | ICD-10-CM

## 2021-12-13 DIAGNOSIS — F1410 Cocaine abuse, uncomplicated: Secondary | ICD-10-CM

## 2021-12-13 DIAGNOSIS — Z20822 Contact with and (suspected) exposure to covid-19: Secondary | ICD-10-CM | POA: Diagnosis present

## 2021-12-13 LAB — COMPREHENSIVE METABOLIC PANEL
ALT: 104 U/L — ABNORMAL HIGH (ref 0–44)
AST: 134 U/L — ABNORMAL HIGH (ref 15–41)
Albumin: 3.7 g/dL (ref 3.5–5.0)
Alkaline Phosphatase: 156 U/L — ABNORMAL HIGH (ref 38–126)
Anion gap: 15 (ref 5–15)
BUN: 15 mg/dL (ref 6–20)
CO2: 23 mmol/L (ref 22–32)
Calcium: 9.6 mg/dL (ref 8.9–10.3)
Chloride: 94 mmol/L — ABNORMAL LOW (ref 98–111)
Creatinine, Ser: 0.78 mg/dL (ref 0.61–1.24)
GFR, Estimated: >60 mL/min (ref >60)
Glucose, Bld: 110 mg/dL — ABNORMAL HIGH (ref 70–99)
Potassium: 3.2 mmol/L — ABNORMAL LOW (ref 3.5–5.1)
Sodium: 132 mmol/L — ABNORMAL LOW (ref 135–145)
Total Bilirubin: 1.4 mg/dL — ABNORMAL HIGH (ref 0.3–1.2)
Total Protein: 8 g/dL (ref 6.5–8.1)

## 2021-12-13 LAB — CBC
HCT: 41.8 % (ref 39.0–52.0)
Hemoglobin: 14 g/dL (ref 13.0–17.0)
MCH: 28.4 pg (ref 26.0–34.0)
MCHC: 33.5 g/dL (ref 30.0–36.0)
MCV: 84.8 fL (ref 80.0–100.0)
Platelets: 74 10*3/uL — ABNORMAL LOW (ref 150–400)
RBC: 4.93 MIL/uL (ref 4.22–5.81)
RDW: 13 % (ref 11.5–15.5)
WBC: 5.5 10*3/uL (ref 4.0–10.5)
nRBC: 0 % (ref 0.0–0.2)

## 2021-12-13 LAB — URINE DRUG SCREEN, QUALITATIVE (ARMC ONLY)
Amphetamines, Ur Screen: NOT DETECTED
Barbiturates, Ur Screen: NOT DETECTED
Benzodiazepine, Ur Scrn: NOT DETECTED
Cannabinoid 50 Ng, Ur ~~LOC~~: POSITIVE — AB
Cocaine Metabolite,Ur ~~LOC~~: POSITIVE — AB
MDMA (Ecstasy)Ur Screen: NOT DETECTED
Methadone Scn, Ur: NOT DETECTED
Opiate, Ur Screen: NOT DETECTED
Phencyclidine (PCP) Ur S: NOT DETECTED
Tricyclic, Ur Screen: NOT DETECTED

## 2021-12-13 LAB — RESP PANEL BY RT-PCR (FLU A&B, COVID) ARPGX2
Influenza A by PCR: NEGATIVE
Influenza B by PCR: NEGATIVE
SARS Coronavirus 2 by RT PCR: NEGATIVE

## 2021-12-13 LAB — LIPASE, BLOOD: Lipase: 70 U/L — ABNORMAL HIGH (ref 11–51)

## 2021-12-13 LAB — ETHANOL: Alcohol, Ethyl (B): <10 mg/dL (ref <10)

## 2021-12-13 LAB — MAGNESIUM: Magnesium: 1.7 mg/dL (ref 1.7–2.4)

## 2021-12-13 LAB — ACETAMINOPHEN LEVEL: Acetaminophen (Tylenol), Serum: <10 ug/mL — ABNORMAL LOW (ref 10–30)

## 2021-12-13 LAB — GLUCOSE, CAPILLARY: Glucose-Capillary: 91 mg/dL (ref 70–99)

## 2021-12-13 LAB — MRSA NEXT GEN BY PCR, NASAL: MRSA by PCR Next Gen: NOT DETECTED

## 2021-12-13 MED ORDER — LORAZEPAM 2 MG/ML IJ SOLN
0.0000 mg | Freq: Four times a day (QID) | INTRAMUSCULAR | Status: DC
  Filled 2021-12-13: qty 1

## 2021-12-13 MED ORDER — HALOPERIDOL LACTATE 5 MG/ML IJ SOLN
2.0000 mg | Freq: Four times a day (QID) | INTRAMUSCULAR | Status: DC | PRN
  Administered 2021-12-13: 2 mg via INTRAVENOUS
  Filled 2021-12-13: qty 1

## 2021-12-13 MED ORDER — LORAZEPAM 2 MG/ML IJ SOLN
2.0000 mg | Freq: Once | INTRAMUSCULAR | Status: AC
  Administered 2021-12-13: 2 mg via INTRAVENOUS

## 2021-12-13 MED ORDER — ONDANSETRON HCL 4 MG/2ML IJ SOLN: 4.0000 mg | Freq: Four times a day (QID) | INTRAMUSCULAR | Status: DC | PRN

## 2021-12-13 MED ORDER — THIAMINE HCL 100 MG/ML IJ SOLN
500.0000 mg | INTRAVENOUS | Status: DC
  Administered 2021-12-13 – 2021-12-14 (×2): 500 mg via INTRAVENOUS
  Filled 2021-12-13 (×4): qty 5

## 2021-12-13 MED ORDER — DIAZEPAM 5 MG/ML IJ SOLN
5.0000 mg | Freq: Four times a day (QID) | INTRAMUSCULAR | Status: DC | PRN
  Administered 2021-12-15: 5 mg via INTRAVENOUS
  Filled 2021-12-13: qty 2

## 2021-12-13 MED ORDER — SODIUM CHLORIDE 0.45 % IV SOLN
INTRAVENOUS | Status: DC
  Filled 2021-12-13: qty 1000

## 2021-12-13 MED ORDER — THIAMINE HCL 100 MG PO TABS
100.0000 mg | ORAL_TABLET | Freq: Every day | ORAL | Status: DC
  Administered 2021-12-13: 100 mg via ORAL
  Filled 2021-12-13: qty 1

## 2021-12-13 MED ORDER — LORAZEPAM 1 MG PO TABS
1.0000 mg | ORAL_TABLET | Freq: Once | ORAL | Status: AC
  Administered 2021-12-13: 1 mg via ORAL
  Filled 2021-12-13: qty 1

## 2021-12-13 MED ORDER — ACETAMINOPHEN 650 MG RE SUPP
650.0000 mg | Freq: Four times a day (QID) | RECTAL | Status: DC | PRN
Start: 2021-12-13 — End: 2021-12-16

## 2021-12-13 MED ORDER — ACETAMINOPHEN 325 MG PO TABS
650.0000 mg | ORAL_TABLET | Freq: Four times a day (QID) | ORAL | Status: DC | PRN
  Administered 2021-12-14 – 2021-12-15 (×2): 650 mg via ORAL
  Filled 2021-12-13 (×2): qty 2

## 2021-12-13 MED ORDER — THIAMINE HCL 100 MG PO TABS
100.0000 mg | ORAL_TABLET | Freq: Every day | ORAL | Status: DC
  Administered 2021-12-16: 100 mg via ORAL
  Filled 2021-12-13: qty 1

## 2021-12-13 MED ORDER — LORAZEPAM 2 MG/ML IJ SOLN
2.0000 mg | Freq: Once | INTRAMUSCULAR | Status: AC
  Administered 2021-12-13: 2 mg via INTRAVENOUS
  Filled 2021-12-13: qty 1

## 2021-12-13 MED ORDER — SODIUM CHLORIDE 0.45 % IV SOLN: INTRAVENOUS | Status: DC

## 2021-12-13 MED ORDER — ONDANSETRON HCL 4 MG PO TABS: 4.0000 mg | ORAL_TABLET | Freq: Four times a day (QID) | ORAL | Status: DC | PRN

## 2021-12-13 MED ORDER — LORAZEPAM 2 MG/ML IJ SOLN: 0.0000 mg | Freq: Two times a day (BID) | INTRAMUSCULAR | Status: DC

## 2021-12-13 MED ORDER — POTASSIUM CHLORIDE IN NACL 40-0.9 MEQ/L-% IV SOLN
INTRAVENOUS | Status: DC
  Filled 2021-12-13 (×3): qty 1000

## 2021-12-13 MED ORDER — LORAZEPAM 2 MG PO TABS: 0.0000 mg | ORAL_TABLET | Freq: Two times a day (BID) | ORAL | Status: DC

## 2021-12-13 MED ORDER — FOLIC ACID 1 MG PO TABS
1.0000 mg | ORAL_TABLET | Freq: Every day | ORAL | Status: DC
  Administered 2021-12-14 – 2021-12-16 (×3): 1 mg via ORAL
  Filled 2021-12-13 (×3): qty 1

## 2021-12-13 MED ORDER — NICOTINE 21 MG/24HR TD PT24
21.0000 mg | MEDICATED_PATCH | Freq: Every day | TRANSDERMAL | Status: DC
  Administered 2021-12-14 – 2021-12-16 (×3): 21 mg via TRANSDERMAL
  Filled 2021-12-13 (×3): qty 1

## 2021-12-13 MED ORDER — SODIUM CHLORIDE 0.9 % IV BOLUS
1000.0000 mL | Freq: Once | INTRAVENOUS | Status: AC
  Administered 2021-12-13: 1000 mL via INTRAVENOUS

## 2021-12-13 MED ORDER — THIAMINE HCL 100 MG/ML IJ SOLN: 100.0000 mg | Freq: Every day | INTRAMUSCULAR | Status: DC

## 2021-12-13 MED ORDER — ADULT MULTIVITAMIN W/MINERALS CH
1.0000 | ORAL_TABLET | Freq: Every day | ORAL | Status: DC
  Administered 2021-12-14 – 2021-12-16 (×3): 1 via ORAL
  Filled 2021-12-13 (×3): qty 1

## 2021-12-13 MED ORDER — DEXMEDETOMIDINE HCL IN NACL 400 MCG/100ML IV SOLN
0.4000 ug/kg/h | INTRAVENOUS | Status: DC
  Administered 2021-12-13 – 2021-12-14 (×3): 0.4 ug/kg/h via INTRAVENOUS
  Filled 2021-12-13 (×3): qty 100

## 2021-12-13 MED ORDER — THIAMINE HCL 100 MG/ML IJ SOLN
100.0000 mg | Freq: Every day | INTRAMUSCULAR | Status: DC
  Filled 2021-12-13: qty 2

## 2021-12-13 MED ORDER — LORAZEPAM 2 MG PO TABS
0.0000 mg | ORAL_TABLET | Freq: Four times a day (QID) | ORAL | Status: DC
  Administered 2021-12-13: 2 mg via ORAL
  Filled 2021-12-13 (×2): qty 1

## 2021-12-13 MED ORDER — CHLORDIAZEPOXIDE HCL 25 MG PO CAPS
50.0000 mg | ORAL_CAPSULE | Freq: Once | ORAL | Status: AC
  Administered 2021-12-13: 50 mg via ORAL
  Filled 2021-12-13: qty 2

## 2021-12-13 MED ORDER — CHLORDIAZEPOXIDE HCL 25 MG PO CAPS: 25.0000 mg | ORAL_CAPSULE | Freq: Once | ORAL | Status: DC

## 2021-12-13 NOTE — ED Notes (Signed)
Pt to ED for alcohol withdrawal, last drink was Tuesday. Pt states he can drink a half a gallon of liquor in a couple days.  ? ?Pt states his dad brought him in because he thinks pt is hallucinating, pt denies any AVH.  ? ?Pt is A&Ox4. ?

## 2021-12-13 NOTE — ED Notes (Signed)
Pt continuing to try and get out of bed, continuously talking to internal stimuli. Attempted to redirect pt, offering warm blanket and beverage. ?EDT Paige at bedside to help with patient safety.  ?

## 2021-12-13 NOTE — ED Notes (Signed)
IVC medical admit 

## 2021-12-13 NOTE — Plan of Care (Signed)
?  Interdisciplinary Goals of Care Family Meeting ? ? ?Date carried out: 12/13/2021 ? ?Location of the meeting: Unit ? ?Member's involved: Physician and Family Member or next of kin ? ?Durable Power of Programme researcher, broadcasting/film/video maker:FATHER   ? ?Discussion: We discussed goals of care for Travis Leach .   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 ? ?Code status: Full Code ? ?Disposition: Continue current acute care ? ? ? ?GOALS OF CARE DISCUSSION ? ?The Clinical status was relayed to family in detail- ?Father ? ?Updated and notified of patients medical condition- ?Patient remains unresponsive and will not open eyes to command.   ?Patient is having a weak cough and struggling to remove secretions.   ?Patient with increased WOB and using accessory muscles to breathe ?Explained to family course of therapy and the modalities  ? ?High risk for intubation and cardiac arrest ? ? ?Patient with Progressive multiorgan failure with a very high probablity of a very minimal chance of meaningful recovery despite all aggressive and optimal medical therapy.  ?PATIENT REMAINS FULL CODE ? ?Family understands the situation. ? ?Family are satisfied with Plan of action and management. All questions answered ? ?Additional CC time 35 mins ? ? ?Lucie Leather, M.D.  ?[facility]  ?Medical Director [facility] ?Medical Director [facility] Cardio-Pulmonary Department  ? ? ?

## 2021-12-13 NOTE — Plan of Care (Signed)
Pt admitted to ICU ~2000. VSS. Afebrile. Remains on precedex gtt @0 .4 mcg/kg/kr. Pt drowsy. Moves all extremities; bilateral upper extremity tremors. Oriented to self and place. Confused. Occasionally restless. Speech slurred with incomprehensible words. CIWA <5 this shift with precedex gtt. ?Problem: Education: ?Goal: Knowledge of General Education information will improve ?Description: Including pain rating scale, medication(s)/side effects and non-pharmacologic comfort measures ?Outcome: Progressing ?  ?Problem: Health Behavior/Discharge Planning: ?Goal: Ability to manage health-related needs will improve ?Outcome: Progressing ?  ?Problem: Clinical Measurements: ?Goal: Ability to maintain clinical measurements within normal limits will improve ?Outcome: Progressing ?Goal: Will remain free from infection ?Outcome: Progressing ?Goal: Diagnostic test results will improve ?Outcome: Progressing ?Goal: Respiratory complications will improve ?Outcome: Progressing ?Goal: Cardiovascular complication will be avoided ?Outcome: Progressing ?  ?Problem: Activity: ?Goal: Risk for activity intolerance will decrease ?Outcome: Progressing ?  ?Problem: Nutrition: ?Goal: Adequate nutrition will be maintained ?Outcome: Progressing ?  ?Problem: Coping: ?Goal: Level of anxiety will decrease ?Outcome: Progressing ?  ?Problem: Elimination: ?Goal: Will not experience complications related to bowel motility ?Outcome: Progressing ?Goal: Will not experience complications related to urinary retention ?Outcome: Progressing ?  ?Problem: Pain Managment: ?Goal: General experience of comfort will improve ?Outcome: Progressing ?  ?Problem: Safety: ?Goal: Ability to remain free from injury will improve ?Outcome: Progressing ?  ?Problem: Skin Integrity: ?Goal: Risk for impaired skin integrity will decrease ?Outcome: Progressing ?  ?

## 2021-12-13 NOTE — ED Notes (Signed)
Pt increasingly agitated, Refusing IVF and stating that "I wanna go home, I dont want fluids and getting stuck again", this RN explained to pt the benefits of IVF, pt still refusing. EDP Quentin Cornwall made aware, order for 2nd dose of ativan to be given. See mar for details. ?

## 2021-12-13 NOTE — Assessment & Plan Note (Addendum)
Initially patient was on Precedex drip.  Phenobarbital was added.  I will taper phenobarbital over a couple more days upon disposition and then stop. ?

## 2021-12-13 NOTE — H&P (Signed)
?History and Physical  ? ? ?Patient: Travis Leach DOB: [birth year] ?DOA: 12/13/2021 ?DOS: the patient was seen and examined on 12/13/2021 ?PCP: Pcp, No  ?Patient coming from: Home ? ?Chief Complaint:  ?Chief Complaint  ?Patient presents with  ? Withdrawal  ? ?Most of the history was obtained from his father who was at the bedside ?HPI: Travis Leach is a 46 y.o. male with medical history significant for polysubstance abuse who was brought into the ER by his father for evaluation of symptoms thought to be secondary to alcohol withdrawal. ?Patient lives alone in [place] but has been staying with his father for the last 3 days.  He usually drinks beer and 1/5 of liquor daily but has not had a drink in about 3 days.  Father states that he has had visual hallucinations and has been seeing things that are not there.  Patient appears tremulous and agitated during my exam. ?He refuses to answer questions and is refusing to be admitted. ?Patient noted to be tachycardic with heart rate of 130 bpm and hypertensive. ?IVC papers were taken out in the ER because patient is not able to make any decisions at this time and is uncooperative. ?Review of Systems: Unable to review all systems due to lack of cooperation from patient. ?Past Medical History:  ?Diagnosis Date  ? Hypertension   ? Pancreatitis   ? ?History reviewed. No pertinent surgical history. ?Social History:  reports that he has been smoking cigarettes. He does not have any smokeless tobacco history on file. He reports current alcohol use. He reports current drug use. Drug: Marijuana. ? ?No Known Allergies ? ?No family history on file. ? ?Prior to Admission medications   ?Medication Sig Start Date End Date Taking? Authorizing Provider  ?ibuprofen (ADVIL) 200 MG tablet Take 400 mg by mouth every 6 (six) hours as needed for moderate pain. ?Patient not taking: Reported on 12/13/2021    [provider]  ?ondansetron (ZOFRAN ODT) 8 MG disintegrating  tablet Take 1 tablet (8 mg total) by mouth every 8 (eight) hours as needed for nausea. ?Patient not taking: Reported on 12/13/2021 05/04/19   Derwood Kaplan, MD  ?pantoprazole (PROTONIX) 20 MG tablet Take 2 tablets (40 mg total) by mouth daily. ?Patient not taking: Reported on 05/04/2019 09/25/11 09/24/12  Lorre Nick, MD  ? ? ?Physical Exam: ?Vitals:  ? 12/13/21 1201 12/13/21 1256 12/13/21 1400 12/13/21 1420  ?BP: (!) 159/108 (!) 158/136 115/87   ?Pulse: (!) 109 (!) 134 (!) 125   ?Resp: 20  19   ?Temp:    99 ?F (37.2 ?C)  ?TempSrc:    Oral  ?SpO2: 100%  100%   ?Weight:      ?Height:      ? ?Physical Exam ?Constitutional:   ?   Comments: Anxious and agitated  ?HENT:  ?   Head: Normocephalic.  ?   Nose: Nose normal.  ?   Mouth/Throat:  ?   Mouth: Mucous membranes are dry.  ?Eyes:  ?   Pupils: Pupils are equal, round, and reactive to light.  ?Cardiovascular:  ?   Rate and Rhythm: Tachycardia present.  ?Pulmonary:  ?   Effort: Pulmonary effort is normal.  ?   Breath sounds: Normal breath sounds.  ?Abdominal:  ?   General: Abdomen is flat. Bowel sounds are normal.  ?   Palpations: Abdomen is soft.  ?Musculoskeletal:  ?   Cervical back: Normal range of motion and neck supple.  ?Skin: ?  General: Skin is warm and dry.  ?Neurological:  ?   General: No focal deficit present.  ?   Mental Status: He is alert.  ?   Comments: Oriented to person and place.  Very agitated and tremulous  ?Psychiatric:  ?   Comments: Aggressive  ? ? ?Data Reviewed: ?Relevant notes from primary care and specialist visits, past discharge summaries as available in EHR, including Care Everywhere. ?Prior diagnostic testing as pertinent to current admission diagnoses ?Updated medications and problem lists for reconciliation ?ED course, including vitals, labs, imaging, treatment and response to treatment ?Triage notes, nursing and pharmacy notes and ED provider's notes ?Notable results as noted in HPI ?Urine drug screen is positive for cocaine and  cannabinoids, sodium 132, potassium 3.2, chloride 94, glucose 110, BUN 15, creatinine 0.78, AST 134, ALT 104, alkaline phosphatase 156, total bilirubin 1.4 ?There are no new results to review at this time. ? ?Assessment and Plan: ?* Alcohol withdrawal (HCC) ?Patient with a history of alcohol abuse who was brought in for evaluation of visual and auditory hallucination and tremulousness ?Patient noted to be very agitated in the emergency room with tachycardia and hypertension ?He was started on CIWA protocol and has received multiple doses of lorazepam but remains very agitated and difficult to redirect. ?We will consult intensivist since patient will likely need Precedex ?Place patient on MVI, thiamine and folic acid ? 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 ?Hypertension ?Most likely related to alcohol withdrawal ?We will place patient on clonidine 1 mg p.o. twice daily ? ?Nicotine dependence ?Smoking cessation was discussed with patient ?We will place patient on a nicotine transdermal patch 21 mg daily ? ?Hypokalemia ?We will supplement potassium ?Check magnesium levels ? ? ? ? ? Advance Care Planning:   Code Status: Full Code  ? ?Consults: Critical care ? ?Family Communication: Greater than 50% of time was spent discussing patient's condition and plan of care with his father at the bedside.  All questions and concerns have been addressed.  He verbalizes understanding and agrees with the plan. ? ?Severity of Illness: ?The appropriate patient status for this patient is INPATIENT. Inpatient status is judged to be reasonable and necessary in order to provide the required intensity of service to ensure the patient's safety. The patient's presenting symptoms, physical exam findings, and initial radiographic and laboratory data in the context of their chronic comorbidities is felt to place them at high risk  for further clinical deterioration. Furthermore, it is not anticipated that the patient will be medically stable for discharge from the hospital within 2 midnights of admission.  ? ?* I certify that at the point of admission it is my clinical judgment that the patient will require inpatient hospital care spanning beyond 2 midnights from the point of admission due to high intensity of service, high risk for further deterioration and high frequency of surveillance required.* ? ?Author: ?Lucile Shutters, MD ?12/13/2021 3:16 PM ? ?For on call review [email or web address].  ?

## 2021-12-13 NOTE — Progress Notes (Signed)
eLink Physician-Brief Progress Note ?Patient Name: Travis Leach ?DOB: [birth year] ?MRN: [id number] ? ? ?Date of Service ? 12/13/2021  ?HPI/Events of Note ? 46 yr old admitted with severe ETOH withdrawal and acute cocaine and marijuana abuse leading to encephalopathy and agitated delirium.  Now calm on precedex drip and ciwa protocol.  ?eICU Interventions ? Chart reviewed  ? ? ? ?Intervention Category ?Evaluation Type: New Patient Evaluation ? Mauri Brooklyn, P ?12/13/2021, 9:39 PM ?

## 2021-12-13 NOTE — ED Triage Notes (Signed)
Patient to ER via Pov with family with complaints of alcohol withdrawal. Reports heavily alcohol usage but stopped several days ago, reports usually drinks beer and a fifth of liquor daily.  ? ?Patient tremulous, reports headache and nausea. Denies hallucinations. A&ox4.  ?

## 2021-12-13 NOTE — Consult Note (Addendum)
? ?NAME:  Travis Leach, MRN:  [id number], DOB:  03-12-1976, LOS: 0 ?ADMISSION DATE:  12/13/2021, CONSULTATION DATE:  12/13/2021 ?REFERRING MD:  Dr. Joylene Igo, CHIEF COMPLAINT:  Severe DT's  ? ?Brief Pt Description / Synopsis:  ?46 y.o. male admitted with Acute Metabolic Encephalopathy in the setting of severe DT's requiring Precedex gtt.  UDS also positive for cocaine and Marijuana.  High risk for intubation. ? ?History of Present Illness:  ?Travis Leach is a 46 year old male with a past medical history significant for polysubstance abuse, alcohol abuse, pancreatitis, and hypertension who presented to [facility] ED on 12/13/2021 due to concern for alcohol withdrawal. ? ?Patient is currently altered and unable to provide history and no family is present, therefore history is obtained from chart review.  Per notes the patient lives alone in [place], however has been staying with his father for the past 3 days.  Normally he drinks beer and 1/5 of liquor daily but has not drank in approximately 3 days.  His father brought him to the ED due to visual hallucinations, incoherent speech, along with appearing tremulous, anxious, and diaphoretic. ? ?ED Course: ?Initial vital signs: Temperature 98.6, respiratory rate 22, pulse 109, blood pressure 162/118, 100% SPO2 on room air ?Significant Labs: Sodium 132, potassium 4.2, chloride 94, glucose 110, BUN 15, creatinine 0.78, alkaline phosphatase 156, AST 134, ALT 104, total bilirubin 1.4, platelets 74 ?COVID-19 and influenza PCR negative ?Urine drug screen positive for cocaine and cannabinoid ?Ethyl alcohol less than 10 ?Medications administered: Ativan 1 mg p.o. tablet, Ativan 2 mg IV x2 doses, 1 L normal saline bolus, 50 mg p.o. Librium ? ? ?Hospitalist were asked to admit.  Despite receiving multiple doses of Ativan, patient became progressively agitated and unable to be redirected.  Due to worsening DTs, PCCM consulted for possible Precedex. ? ?Pertinent  Medical History  ?ETOH  abuse ?Pancreatitis ?Hypertension ? ?Micro Data:  ?3/16: SARS-CoV-2 & Influenza PCR>>negative ? ?Antimicrobials:  ?N/A ? ?Significant Hospital Events: ?Including procedures, antibiotic start and stop dates in addition to other pertinent events   ?3/16: Presents to ED with withdrawal like symptoms.  Hospialist admitting.  PCCM consulted as pt may require Precedex. ? ?Interim History / Subjective:  ?-Patient was brought into the ED today by his father due to concern for alcohol withdrawal ?-Last known drink was approximately 3 days ago ?-Being admitted by the hospitalist, however continues to be agitated ~PCCM consulted due to possible need for Precedex ?-Upon arrival to the ED numerous ED staff and security officers at bedside due to patient agitation ~we will go ahead and initiate Precedex ?-High risk for intubation ? ?Objective   ?Blood pressure 115/87, pulse (!) 125, temperature 99 ?F (37.2 ?C), temperature source Oral, resp. rate 19, height 5\' 9"  (1.753 m), weight 77.1 kg, SpO2 100 %. ?   ?   ?No intake or output data in the 24 hours ending 12/13/21 1503 ?Filed Weights  ? 12/13/21 1112  ?Weight: 77.1 kg  ? ? ?Examination: ?General: Acutely ill-appearing male, laying in bed, on room air, with agitation ?HENT: Atraumatic, normocephalic, neck supple, no JVD ?Lungs: Clear breath sounds bilaterally, even, nonlabored ?Cardiovascular: Tachycardia, regular rhythm, S1-S2, no murmurs, rubs, gallop ?Abdomen: Soft, nontender, nondistended, no guarding rebound tenderness, bowel sounds positive x4 ?Extremities: Normal bulk and tone, no deformities, no edema ?Neuro: Delirious and agitated, moves all extremities but will not follow commands ?GU: Deferred ? ?Resolved Hospital Problem list   ? ? ?Assessment & Plan:  ? ?Acute Metabolic Encephalopathy  in the setting of Severe Delirium Tremens ?PMHx: ETOH abuse, reportedly drinks beer and about 1/5 liquor daily ?-Provide supportive care ?-Aspiration Precautions ?-Currently protecting  his airway, but remains high risk for intubation ?-CIWA protocol ?-Prn Valium ?-Precedex as needed ?-Thiamine, MVI, folic acid (will do high dose thiamine x3 days) ?-UDS + for Cocaine, marijuana ? ?Hyponatremia ?Hypokalemia ?-Monitor I&O's / urinary output ?-Follow BMP ?-Ensure adequate renal perfusion ?-Avoid nephrotoxic agents as able ?-Replace electrolytes as indicated ?-Pharmacy consulted for assistance with electrolyte replacement ?-IV fluids ? ?Elevated LFT's, likely due to ETOH abuse ?PMHx: Pancreatitis ?-Follow LFT's ?-Check Hepatitis Panel and Tylenol level ?-Obtain RUQ [place] ?-Check Lipase ? ?Thrombocytopenia, likely in setting of ETOH abuse ?-Monitor for S/Sx of bleeding ?-Trend CBC ?-SCD's for VTE Prophylaxis  ?-Transfuse for Hgb <7 ? ? ?Best Practice (right click and "Reselect all SmartList Selections" daily)  ? ?Diet/type: Regular consistency (see orders) ?DVT prophylaxis: SCD ?GI prophylaxis: N/A ?Lines: N/A ?Foley:  N/A ?Code Status:  full code ?Last date of multidisciplinary goals of care discussion [N/A] ? ?Labs   ?CBC: ?Recent Labs  ?Lab 12/13/21 ?1115  ?WBC 5.5  ?HGB 14.0  ?HCT 41.8  ?MCV 84.8  ?PLT 74*  ? ? ?Basic Metabolic Panel: ?Recent Labs  ?Lab 12/13/21 ?1115  ?NA 132*  ?K 3.2*  ?CL 94*  ?CO2 23  ?GLUCOSE 110*  ?BUN 15  ?CREATININE 0.78  ?CALCIUM 9.6  ? ?GFR: ?Estimated Creatinine Clearance: 117.8 mL/min (by C-G formula based on SCr of 0.78 mg/dL). ?Recent Labs  ?Lab 12/13/21 ?1115  ?WBC 5.5  ? ? ?Liver Function Tests: ?Recent Labs  ?Lab 12/13/21 ?1115  ?AST 134*  ?ALT 104*  ?ALKPHOS 156*  ?BILITOT 1.4*  ?PROT 8.0  ?ALBUMIN 3.7  ? ?No results for input(s): LIPASE, AMYLASE in the last 168 hours. ?No results for input(s): AMMONIA in the last 168 hours. ? ?ABG ?No results found for: PHART, PCO2ART, PO2ART, HCO3, TCO2, ACIDBASEDEF, O2SAT  ? ?Coagulation Profile: ?No results for input(s): INR, PROTIME in the last 168 hours. ? ?Cardiac Enzymes: ?No results for input(s): CKTOTAL, CKMB, CKMBINDEX,  TROPONINI in the last 168 hours. ? ?HbA1C: ?No results found for: HGBA1C ? ?CBG: ?No results for input(s): GLUCAP in the last 168 hours. ? ?Review of Systems:   ?Unable to assess due to AMS ? ? ?Past Medical History:  ?He,  has a past medical history of Hypertension and Pancreatitis.  ? ?Surgical History:  ?History reviewed. No pertinent surgical history.  ? ?Social History:  ? reports that he has been smoking cigarettes. He does not have any smokeless tobacco history on file. He reports current alcohol use. He reports current drug use. Drug: Marijuana.  ? ?Family History:  ?His family history is not on file.  ? ?Allergies ?No Known Allergies  ? ?Home Medications  ?Prior to Admission medications   ?Medication Sig Start Date End Date Taking? Authorizing Provider  ?ibuprofen (ADVIL) 200 MG tablet Take 400 mg by mouth every 6 (six) hours as needed for moderate pain. ?Patient not taking: Reported on 12/13/2021    [provider]  ?ondansetron (ZOFRAN ODT) 8 MG disintegrating tablet Take 1 tablet (8 mg total) by mouth every 8 (eight) hours as needed for nausea. ?Patient not taking: Reported on 12/13/2021 05/04/19   Derwood Kaplan, MD  ?pantoprazole (PROTONIX) 20 MG tablet Take 2 tablets (40 mg total) by mouth daily. ?Patient not taking: Reported on 05/04/2019 09/25/11 09/24/12  Lorre Nick, MD  ?  ? ?Critical care time:  55 minutes ?  ? ? ?Harlon DittyJeremiah Lake Breeding, AGACNP-BC ?[facility] ?Prefer epic messenger for cross cover needs ?If after hours, please call E-link ? ? ? ?

## 2021-12-13 NOTE — Assessment & Plan Note (Deleted)
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

## 2021-12-13 NOTE — Assessment & Plan Note (Addendum)
-  Nicotine patch 

## 2021-12-13 NOTE — ED Notes (Signed)
Pt having snoring respirations, pt placed on 2L. O2 at 100%, RR 22. Elvina Sidle, NP updated.  ?

## 2021-12-13 NOTE — Assessment & Plan Note (Addendum)
Replaced into normal range. 

## 2021-12-13 NOTE — BH Assessment (Signed)
Comprehensive Clinical Assessment (CCA) Note ? ?12/13/2021 ?Travis Leach ?[id number] ? ?Chief Complaint:  ?Chief Complaint  ?Patient presents with  ? Withdrawal  ? ?Visit Diagnosis: Alcohol Use Disorder  ? ? ?Travis Leach is a 46 year old male who presents to the ER via his father, because of concerns about his alcohol use and withdrawal symptoms. Per the patient, he drinks daily and he hasn't had anything in the last two days. The longest he's been sober is approximately two days. Patient UDS was positive for cocaine and cannabis. Patient admits to the use of cannabis but denies recent use of cocaine. He states, his father pressured him to come to the ER because he reported he wasn't feeling a well. Father reported the patient was hallucinating. Patient denies it and states he is frustrated because he has too much to do today, and he is wasting time. Writer made several attempts to motivate the patient to get help but he declined. Throughout the interview, he denied SI/HI and AV/H. He denies involvement with the legal system. ? ?Writer provided the patient with information with six different treatment facilities to follow up with, in the event he wants to go to treatment. ? ? ?CCA Screening, Triage and Referral (STR) ? ?Patient Reported Information ?How did you hear about [place]? Family/Friend ? ?What Is the Reason for Your Visit/Call Today? Brought to the ER by his father, seeking alcohol detox. ? ?How Long Has This Been Causing You Problems? > than 6 months ? ?What Do You Feel Would Help You the Most Today? Alcohol or Drug Use Treatment ? ? ?Have You Recently Had Any Thoughts About Hurting Yourself? No ? ?Are You Planning to Commit Suicide/Harm Yourself At This time? No ? ? ?Have you Recently Had Thoughts About Hurting Someone Travis Leach? No ? ?Are You Planning to Harm Someone at This Time? No ? ?Explanation: No data recorded ? ?Have You Used Any Alcohol or Drugs in the Past 24 Hours? Yes ? ?How Long Ago Did You Use Drugs  or Alcohol? No data recorded ?What Did You Use and How Much? Alcohol, cocaine, and cannabis ? ? ?Do You Currently Have a Therapist/Psychiatrist? No ? ?Name of Therapist/Psychiatrist: No data recorded ? ?Have You Been Recently Discharged From Any Office Practice or Programs? No ? ?Explanation of Discharge From Practice/Program: No data recorded ? ?  ?CCA Screening Triage Referral Assessment ?Type of Contact: Face-to-Face ? ?Telemedicine Service Delivery:   ?Is this Initial or Reassessment? No data recorded ?Date Telepsych consult ordered in CHL:  No data recorded ?Time Telepsych consult ordered in CHL:  No data recorded ?Location of Assessment: [facility] ED ? ?Provider Location: [facility] ED ? ? ?Collateral Involvement: Father ? ? ?Does Patient Have a Automotive engineer Guardian? No data recorded ?Name and Contact of Legal Guardian: No data recorded ?If Minor and Not Living with Parent(s), Who has Custody? No data recorded ?Is CPS involved or ever been involved? Never ? ?Is APS involved or ever been involved? Never ? ? ?Patient Determined To Be At Risk for Harm To Self or Others Based on Review of Patient Reported Information or Presenting Complaint? No ? ?Method: No data recorded ?Availability of Means: No data recorded ?Intent: No data recorded ?Notification Required: No data recorded ?Additional Information for Danger to Others Potential: No data recorded ?Additional Comments for Danger to Others Potential: No data recorded ?Are There Guns or Other Weapons in Your Home? No data recorded ?Types of Guns/Weapons: No data recorded ?Are These Weapons  Safely Secured?                            No data recorded ?Who Could Verify You Are Able To Have These Secured: No data recorded ?Do You Have any Outstanding Charges, Pending Court Dates, Parole/Probation? No data recorded ?Contacted To Inform of Risk of Harm To Self or Others: No data recorded ? ? ?Does Patient Present under Involuntary Commitment? No ? ?IVC Papers Initial  File Date: No data recorded ? ?[place] of Residence: [place] ? ? ?Patient Currently Receiving the Following Services: Not Receiving Services ? ? ?Determination of Need: Emergent (2 hours) ? ? ?Options For Referral: ED Visit ? ? ? ? ?CCA Biopsychosocial ?Patient Reported Schizophrenia/Schizoaffective Diagnosis in Past: No ? ? ?Strengths: Some insight and have support system. ? ? ?Mental Health Symptoms ?Depression:   ?Sleep (too much or little); Weight gain/loss ?  ?Duration of Depressive symptoms:  ?Duration of Depressive Symptoms: Greater than two weeks ?  ?Mania:   ?N/A ?  ?Anxiety:    ?N/A ?  ?Psychosis:   ?None ?  ?Duration of Psychotic symptoms:    ?Trauma:   ?N/A ?  ?Obsessions:   ?N/A ?  ?Compulsions:   ?"Driven" to perform behaviors/acts; Intended to reduce stress or prevent another outcome ?  ?Inattention:   ?N/A ?  ?Hyperactivity/Impulsivity:   ?N/A ?  ?Oppositional/Defiant Behaviors:   ?N/A ?  ?Emotional Irregularity:   ?N/A ?  ?Other Mood/Personality Symptoms:  No data recorded  ? ?Mental Status Exam ?Appearance and self-care  ?Stature:   ?Average ?  ?Weight:   ?Average weight ?  ?Clothing:  No data recorded  ?Grooming:   ?Normal ?  ?Cosmetic use:   ?Age appropriate ?  ?Posture/gait:   ?Normal ?  ?Motor activity:   ?-- (Within normal range) ?  ?Sensorium  ?Attention:   ?Persistent ?  ?Concentration:   ?Normal; Preoccupied ?  ?Orientation:   ?X5 ?  ?Recall/memory:   ?Normal ?  ?Affect and Mood  ?Affect:   ?Appropriate ?  ?Mood:   ?Anxious; Hopeless ?  ?Relating  ?Eye contact:   ?Normal ?  ?Facial expression:   ?Anxious; Responsive ?  ?Attitude toward examiner:   ?Cooperative; Defensive ?  ?Thought and Language  ?Speech flow:  ?Clear and Coherent ?  ?Thought content:   ?Appropriate to Mood and Circumstances ?  ?Preoccupation:   ?Other (Comment) (Patient want to leave) ?  ?Hallucinations:   ?None ?  ?Organization:  No data recorded  ?Executive Functions  ?Fund of Knowledge:   ?Fair ?  ?Intelligence:    ?Average ?  ?Abstraction:   ?Normal ?  ?Judgement:   ?Impaired ?  ?Reality Testing:   ?Adequate ?  ?Insight:   ?Denial ?  ?Decision Making:   ?Impulsive ?  ?Social Functioning  ?Social Maturity:   ?Irresponsible; Impulsive ?  ?Social Judgement:   ?"Chief of Staff"; Heedless ?  ?Stress  ?Stressors:   ?Family conflict ?  ?Coping Ability:   ?Normal ?  ?Skill Deficits:   ?Decision making; Responsibility ?  ?Supports:   ?Family ?  ? ? ?Religion: ?Religion/Spirituality ?Are You A Religious Person?: No ? ?Leisure/Recreation: ?Leisure / Recreation ?Do You Have Hobbies?: No ? ?Exercise/Diet: ?Exercise/Diet ?Do You Exercise?: No ?Have You Gained or Lost A Significant Amount of Weight in the Past Six Months?: No ?Do You Follow a Special Diet?: No ?Do You Have Any Trouble Sleeping?: No ? ? ?CCA Employment/Education ?  Employment/Work Situation: ?Employment / Work Situation ?Employment Situation: Employed ?Work Stressors: Reports of none ?Patient's Job has Been Impacted by Current Illness: No (Per the patient) ?Has Patient ever Been in the Military?: No ? ?Education: ?Education ?Is Patient Currently Attending School?: No ?Did You Attend College?: No ?Did You Have An Individualized Education Program (IIEP): No ?Did You Have Any Difficulty At School?: No ?Patient's Education Has Been Impacted by Current Illness: No ? ? ?CCA Family/Childhood History ?Family and Relationship History: ?Family history ?Marital status: Separated ?Does patient have children?: No ? ?Childhood History:  ?Childhood History ?By whom was/is the patient raised?: Father ?Did patient suffer any verbal/emotional/physical/sexual abuse as a child?: No ?Did patient suffer from severe childhood neglect?: No ?Has patient ever been sexually abused/assaulted/raped as an adolescent or adult?: No ?Was the patient ever a victim of a crime or a disaster?: No ?Witnessed domestic violence?: No ?Has patient been affected by domestic violence as an adult?: No ? ?Child/Adolescent  Assessment: ?  ?CCA Substance Use ?Alcohol/Drug Use: ?Alcohol / Drug Use ?Pain Medications: See PTA ?Prescriptions: See PTA ?Over the Counter: See PTA ?History of alcohol / drug use?: Yes ?Longest period of s

## 2021-12-13 NOTE — ED Provider Notes (Signed)
? ?[facility] ?Provider Note ? ? ? Event Date/Time  ? First MD Initiated Contact with Patient 12/13/21 1141   ?  (approximate) ? ? ?History  ? ?Withdrawal ? ? ?HPI ? ?Travis Leach is a 46 y.o. male with a history of significant alcohol dependence drinking up to 1/5 of liquor daily last drink of alcohol being Tuesday presents to the ER for evaluation of tremulousness and reported hallucinations by father.  Patient denies any hallucinations.  Does feel shaky and anxious.  No fevers no headache.  Denies any pain no new numbness or tingling. ?  ? ? ?Physical Exam  ? ?Triage Vital Signs: ?ED Triage Vitals  ?Enc Vitals Group  ?   BP 12/13/21 1111 (!) 161/113  ?   Pulse Rate 12/13/21 1111 (!) 128  ?   Resp 12/13/21 1111 (!) 22  ?   Temp 12/13/21 1111 98.6 ?F (37 ?C)  ?   Temp Source 12/13/21 1111 Oral  ?   SpO2 12/13/21 1111 100 %  ?   Weight 12/13/21 1112 170 lb (77.1 kg)  ?   Height 12/13/21 1112 5\' 9"  (1.753 m)  ?   Head Circumference --   ?   Peak Flow --   ?   Pain Score 12/13/21 1111 4  ?   Pain Loc --   ?   Pain Edu? --   ?   Excl. in GC? --   ? ? ?Most recent vital signs: ?Vitals:  ? 12/13/21 1400 12/13/21 1420  ?BP: 115/87   ?Pulse: (!) 125   ?Resp: 19   ?Temp:  99 ?F (37.2 ?C)  ?SpO2: 100%   ? ? ? ?Constitutional: Alert  ?Eyes: Conjunctivae are normal.  ?Head: Atraumatic. ?Nose: No congestion/rhinnorhea. ?Mouth/Throat: Mucous membranes are moist.   ?Neck: Painless ROM.  ?Cardiovascular:   Good peripheral circulation. ?Respiratory: Normal respiratory effort.  No retractions.  ?Gastrointestinal: Soft and nontender.  ?Musculoskeletal:  no deformity ?Neurologic:  MAE spontaneously. No gross focal neurologic deficits are appreciated. Tremulous, + ? Tongue fasciculations ?Skin:  Skin is warm, dry and intact. No rash noted. ?Psychiatric: Mood and affect are normal. Speech and behavior are normal. ? ? ? ?ED Results / Procedures / Treatments  ? ?Labs ?(all labs ordered are listed, but only abnormal  results are displayed) ?Labs Reviewed  ?COMPREHENSIVE METABOLIC PANEL - Abnormal; Notable for the following components:  ?    Result Value  ? Sodium 132 (*)   ? Potassium 3.2 (*)   ? Chloride 94 (*)   ? Glucose, Bld 110 (*)   ? AST 134 (*)   ? ALT 104 (*)   ? Alkaline Phosphatase 156 (*)   ? Total Bilirubin 1.4 (*)   ? All other components within normal limits  ?CBC - Abnormal; Notable for the following components:  ? Platelets 74 (*)   ? All other components within normal limits  ?URINE DRUG SCREEN, QUALITATIVE ([facility] ONLY) - Abnormal; Notable for the following components:  ? Cocaine Metabolite,Ur [place] POSITIVE (*)   ? Cannabinoid 50 Ng, Ur [place] POSITIVE (*)   ? All other components within normal limits  ?RESP PANEL BY RT-PCR (FLU A&B, COVID) ARPGX2  ?ETHANOL  ?MAGNESIUM  ?HIV ANTIBODY (ROUTINE TESTING W REFLEX)  ?LIPASE, BLOOD  ?HEPATITIS PANEL, ACUTE  ?ACETAMINOPHEN LEVEL  ? ? ? ?EKG ? ? ? ? ?RADIOLOGY ? ? ? ?PROCEDURES: ? ?Critical Care performed: No ? ?Procedures ? ? ?MEDICATIONS ORDERED IN ED: ?  Medications  ?LORazepam (ATIVAN) injection 0-4 mg ( Intravenous See Alternative 12/13/21 1303)  ?  Or  ?LORazepam (ATIVAN) tablet 0-4 mg (2 mg Oral Given 12/13/21 1303)  ?LORazepam (ATIVAN) injection 0-4 mg (has no administration in time range)  ?  Or  ?LORazepam (ATIVAN) tablet 0-4 mg (has no administration in time range)  ?acetaminophen (TYLENOL) tablet 650 mg (has no administration in time range)  ?  Or  ?acetaminophen (TYLENOL) suppository 650 mg (has no administration in time range)  ?ondansetron (ZOFRAN) tablet 4 mg (has no administration in time range)  ?  Or  ?ondansetron (ZOFRAN) injection 4 mg (has no administration in time range)  ?multivitamin with minerals tablet 1 tablet (has no administration in time range)  ?folic acid (FOLVITE) tablet 1 mg (has no administration in time range)  ?haloperidol lactate (HALDOL) injection 2 mg (2 mg Intravenous Given 12/13/21 1500)  ?nicotine (NICODERM CQ - dosed in mg/24 hours)  patch 21 mg (has no administration in time range)  ?diazepam (VALIUM) injection 5 mg (has no administration in time range)  ?dexmedetomidine (PRECEDEX) 400 MCG/100ML (4 mcg/mL) infusion (0.4 mcg/kg/hr ? 77.1 kg Intravenous New Bag/Given 12/13/21 1529)  ?thiamine 500mg  in normal saline (50ml) IVPB (has no administration in time range)  ?thiamine tablet 100 mg (has no administration in time range)  ?  Or  ?thiamine (B-1) injection 100 mg (has no administration in time range)  ?0.9 % NaCl with KCl 40 mEq / L  infusion (has no administration in time range)  ?LORazepam (ATIVAN) tablet 1 mg (1 mg Oral Given 12/13/21 1123)  ?chlordiazePOXIDE (LIBRIUM) capsule 50 mg (50 mg Oral Given 12/13/21 1210)  ?sodium chloride 0.9 % bolus 1,000 mL (0 mLs Intravenous Stopped 12/13/21 1536)  ?LORazepam (ATIVAN) injection 2 mg (2 mg Intravenous Given 12/13/21 1413)  ?LORazepam (ATIVAN) injection 2 mg (2 mg Intravenous Given 12/13/21 1521)  ? ? ? ?IMPRESSION / MDM / ASSESSMENT AND PLAN / ED COURSE  ?I reviewed the triage vital signs and the nursing notes. ?             ?               ? ?Differential diagnosis includes, but is not limited to, withdrawal, electrolyte abn, dt's, alcohol dependence ? ?Patient presenting with symptoms consistent with acute alcohol withdrawal.  Blood work sent with above differential we will give Ativan as well as Librium. ?Clinical Course as of 12/13/21 1607  ?Thu Dec 13, 2021  ?1342 Patient persistently tachycardic tremulous despite multiple doses of Ativan as well as Librium.  Based on his presentation do feel patient will require hospitalization as he does appear to be in acute alcohol withdrawal.  Patient reluctant but agreeable after at this time. [PR]  ?1345 Case discussed in consultation with hospitalist who agreed admit patient to their service. [PR]  ?1400 Patient threatening to leave become increasingly agitated.  On further conversation with the patient's father who is at bedside states that he has been  increasingly confused and reported hallucinations this morning which is new.  I do not believe the patient clinically has the capacity to leave AMA at this time therefore we will put under medical IVC as I do believe he is having DTs and will require hospitalization for further stabilization and reassessment. [PR]  ?  ?Clinical Course User Index ?[PR] Willy Eddyobinson, Benicio Manna, MD  ? ? ? ?FINAL CLINICAL IMPRESSION(S) / ED DIAGNOSES  ? ?Final diagnoses:  ?Alcohol withdrawal syndrome without complication (HCC)  ? ? ? ?  Rx / DC Orders  ? ?ED Discharge Orders   ? ? None  ? ?  ? ? ? ?Note:  This document was prepared using Dragon voice recognition software and may include unintentional dictation errors. ? ?  ?Willy Eddy, MD ?12/13/21 1607 ? ?

## 2021-12-13 NOTE — ED Notes (Signed)
Pt talking to internal stimuli. Pt ripped out IV and talking to wall. IV catheter intact upon removal. . Dr. Joylene Igo notified, see MAR for new orders.  ?

## 2021-12-13 NOTE — Assessment & Plan Note (Addendum)
Increase Norvasc to 10 mg daily.  Blood pressure elevated most of the hospital stay.  Recommending rechecking blood pressure as outpatient and adjusting medications and adding if needed. ?

## 2021-12-14 ENCOUNTER — Inpatient Hospital Stay: Payer: Self-pay

## 2021-12-14 DIAGNOSIS — F17200 Nicotine dependence, unspecified, uncomplicated: Secondary | ICD-10-CM

## 2021-12-14 DIAGNOSIS — D61818 Other pancytopenia: Secondary | ICD-10-CM

## 2021-12-14 DIAGNOSIS — R7989 Other specified abnormal findings of blood chemistry: Secondary | ICD-10-CM

## 2021-12-14 DIAGNOSIS — F1410 Cocaine abuse, uncomplicated: Secondary | ICD-10-CM

## 2021-12-14 LAB — BASIC METABOLIC PANEL
Anion gap: 11 (ref 5–15)
BUN: 12 mg/dL (ref 6–20)
CO2: 20 mmol/L — ABNORMAL LOW (ref 22–32)
Calcium: 8.7 mg/dL — ABNORMAL LOW (ref 8.9–10.3)
Chloride: 104 mmol/L (ref 98–111)
Creatinine, Ser: 0.57 mg/dL — ABNORMAL LOW (ref 0.61–1.24)
GFR, Estimated: >60 mL/min (ref >60)
Glucose, Bld: 95 mg/dL (ref 70–99)
Potassium: 4.1 mmol/L (ref 3.5–5.1)
Sodium: 135 mmol/L (ref 135–145)

## 2021-12-14 LAB — CBC
HCT: 37.7 % — ABNORMAL LOW (ref 39.0–52.0)
Hemoglobin: 12.4 g/dL — ABNORMAL LOW (ref 13.0–17.0)
MCH: 28.6 pg (ref 26.0–34.0)
MCHC: 32.9 g/dL (ref 30.0–36.0)
MCV: 87.1 fL (ref 80.0–100.0)
Platelets: 69 10*3/uL — ABNORMAL LOW (ref 150–400)
RBC: 4.33 MIL/uL (ref 4.22–5.81)
RDW: 13 % (ref 11.5–15.5)
WBC: 3.8 10*3/uL — ABNORMAL LOW (ref 4.0–10.5)
nRBC: 0 % (ref 0.0–0.2)

## 2021-12-14 LAB — HIV ANTIBODY (ROUTINE TESTING W REFLEX): HIV Screen 4th Generation wRfx: NONREACTIVE

## 2021-12-14 LAB — HEPATITIS PANEL, ACUTE
HCV Ab: NONREACTIVE
Hep A IgM: NONREACTIVE
Hep B C IgM: NONREACTIVE
Hepatitis B Surface Ag: NONREACTIVE

## 2021-12-14 MED ORDER — PHENOBARBITAL 32.4 MG PO TABS
64.8000 mg | ORAL_TABLET | Freq: Two times a day (BID) | ORAL | Status: DC
  Administered 2021-12-14 – 2021-12-16 (×5): 64.8 mg via ORAL
  Filled 2021-12-14 (×5): qty 2

## 2021-12-14 MED ORDER — CHLORHEXIDINE GLUCONATE CLOTH 2 % EX PADS
6.0000 | MEDICATED_PAD | Freq: Every day | CUTANEOUS | Status: DC
  Administered 2021-12-15 – 2021-12-16 (×2): 6 via TOPICAL

## 2021-12-14 MED ORDER — AMLODIPINE BESYLATE 5 MG PO TABS
5.0000 mg | ORAL_TABLET | Freq: Every day | ORAL | Status: DC
  Administered 2021-12-14 – 2021-12-15 (×2): 5 mg via ORAL
  Filled 2021-12-14 (×2): qty 1

## 2021-12-14 MED ORDER — MAGNESIUM OXIDE -MG SUPPLEMENT 400 (240 MG) MG PO TABS
400.0000 mg | ORAL_TABLET | Freq: Every day | ORAL | Status: DC
Start: 2021-12-14 — End: 2021-12-16
  Administered 2021-12-14 – 2021-12-16 (×3): 400 mg via ORAL
  Filled 2021-12-14 (×3): qty 1

## 2021-12-14 NOTE — Assessment & Plan Note (Addendum)
Likely secondary to alcohol.  White blood cell count has come up to 5.2.  Hemoglobin came up to 13.2 and platelet count came up to 122 upon disposition. ?

## 2021-12-14 NOTE — Assessment & Plan Note (Addendum)
Secondary to alcohol.  AST and ALT both trending better and both are 65 as of yesterday.  Total bilirubin 1.0 and alkaline phosphatase down to 132. ?

## 2021-12-14 NOTE — Progress Notes (Signed)
? ?NAME:  Travis Leach, MRN:  [id number], DOB:  [birth year], LOS: 1 ?ADMISSION DATE:  12/13/2021, CONSULTATION DATE:  12/13/2021 ?REFERRING MD:  Dr. Joylene Igo, CHIEF COMPLAINT:  Severe DT's  ? ?Brief Pt Description / Synopsis:  ?46 y.o. male admitted with Acute Metabolic Encephalopathy in the setting of severe DT's requiring Precedex gtt.  UDS also positive for cocaine and Marijuana.   ? ?History of Present Illness:  ?Travis Leach is a 46 year old male with a past medical history significant for polysubstance abuse, alcohol abuse, pancreatitis, and hypertension who presented to [facility] ED on 12/13/2021 due to concern for alcohol withdrawal. ? ?Patient is currently altered and unable to provide history and no family is present, therefore history is obtained from chart review.  Per notes the patient lives alone in [place], however has been staying with his father for the past 3 days.  Normally he drinks beer and 1/5 of liquor daily but has not drank in approximately 3 days.  His father brought him to the ED due to visual hallucinations, incoherent speech, along with appearing tremulous, anxious, and diaphoretic. ? ?ED Course: ?Initial vital signs: Temperature 98.6, respiratory rate 22, pulse 109, blood pressure 162/118, 100% SPO2 on room air ?Significant Labs: Sodium 132, potassium 4.2, chloride 94, glucose 110, BUN 15, creatinine 0.78, alkaline phosphatase 156, AST 134, ALT 104, total bilirubin 1.4, platelets 74 ?COVID-19 and influenza PCR negative ?Urine drug screen positive for cocaine and cannabinoid ?Ethyl alcohol less than 10 ?Medications administered: Ativan 1 mg p.o. tablet, Ativan 2 mg IV x2 doses, 1 L normal saline bolus, 50 mg p.o. Librium ? ? ?Hospitalist were asked to admit.  Despite receiving multiple doses of Ativan, patient became progressively agitated and unable to be redirected.  Due to worsening DTs, PCCM consulted for possible Precedex. ? ?Pertinent  Medical History  ?ETOH  abuse ?Pancreatitis ?Hypertension ? ?Micro Data:  ?3/16: SARS-CoV-2 & Influenza PCR>>negative ?3/16: MRSA PCR>>negative ? ?Antimicrobials:  ?N/A ? ?Significant Hospital Events: ?Including procedures, antibiotic start and stop dates in addition to other pertinent events   ?3/16: Presents to ED with withdrawal like symptoms.  Hospialist admitting.  PCCM consulted as pt may require Precedex. ?3/17: Alert and calm on Precedex.  Add Phenobarbital to assist with weaning Precedex ? ?Interim History / Subjective:  ?-No significant events noted overnight ?-Afebrile, hemodynamically stable, on room air ?-Calm and alert on Precedex ?-Will add Phenobarbital today to assist with Precedex weaning ?-Hepatitis panel negative ?-RUQ Abdominal US pending ?-Pt denies all complaints, reports he wants something to eat and drink ? ?Objective   ?Blood pressure (!) 157/108, pulse 81, temperature (!) 97 ?F (36.1 ?C), temperature source Axillary, resp. rate 15, height 5\' 9"  (1.753 m), weight 65.6 kg, SpO2 100 %. ?   ?   ? ?Intake/Output Summary (Last 24 hours) at 12/14/2021 0809 ?Last data filed at 12/14/2021 0700 ?Gross per 24 hour  ?Intake 1405.04 ml  ?Output 1275 ml  ?Net 130.04 ml  ? ?Filed Weights  ? 12/13/21 1112 12/13/21 2015  ?Weight: 77.1 kg 65.6 kg  ? ? ?Examination: ?General: Acutely ill-appearing male, laying in bed, on room air, calm on Precedex, in NAD ?HENT: Atraumatic, normocephalic, neck supple, no JVD ?Lungs: Clear breath sounds bilaterally, even, nonlabored ?Cardiovascular: Regular rate & rhythm, S1-S2, no murmurs, rubs, gallop ?Abdomen: Soft, nontender, nondistended, no guarding rebound tenderness, bowel sounds positive x4 ?Extremities: Normal bulk and tone, no deformities, no edema ?Neuro: Sleeping on precedex, arouses easily to voice, oriented to person, place, and  time. Moves all extremities to command, no focal deficits ?GU: Deferred ? ?Resolved Hospital Problem list   ? ? ?Assessment & Plan:  ? ?Acute Metabolic  Encephalopathy in the setting of Severe Delirium Tremens & Cocaine Intoxication ?PMHx: ETOH abuse, reportedly drinks beer and about 1/5 liquor daily ?-Provide supportive care ?-Aspiration Precautions ?-Currently protecting his airway ?-CIWA protocol ?-Prn Valium ?-Precedex as needed ?-Will add Phenobarbital to assist with Precedex weaning ?-Thiamine, MVI, folic acid (will do high dose thiamine x3 days) ?-UDS + for Cocaine, marijuana ? ?Hyponatremia ~ resolved ?Hypokalemia ~ resolveed ?-Monitor I&O's / urinary output ?-Follow BMP ?-Ensure adequate renal perfusion ?-Avoid nephrotoxic agents as able ?-Replace electrolytes as indicated ?-Pharmacy consulted for assistance with electrolyte replacement ?-IV fluids ? ?Elevated LFT's, likely due to ETOH abuse ?PMHx: Pancreatitis ?-Follow LFT's ?-Hepatitis Panel and Tylenol level both negative ?-RUQ US is pending ? ?Thrombocytopenia, likely in setting of ETOH abuse ?-Monitor for S/Sx of bleeding ?-Trend CBC ?-SCD's for VTE Prophylaxis  ?-Transfuse for Hgb <7 ? ? ?Best Practice (right click and "Reselect all SmartList Selections" daily)  ? ?Diet/type: Regular consistency (see orders) ?DVT prophylaxis: SCD ?GI prophylaxis: N/A ?Lines: N/A ?Foley:  N/A ?Code Status:  full code ?Last date of multidisciplinary goals of care discussion [12/14/2021] ? ?Labs   ?CBC: ?Recent Labs  ?Lab 12/13/21 ?1115 12/14/21 ?0330  ?WBC 5.5 3.8*  ?HGB 14.0 12.4*  ?HCT 41.8 37.7*  ?MCV 84.8 87.1  ?PLT 74* 69*  ? ? ? ?Basic Metabolic Panel: ?Recent Labs  ?Lab 12/13/21 ?1113 12/13/21 ?1115 12/14/21 ?0330  ?NA  --  132* 135  ?K  --  3.2* 4.1  ?CL  --  94* 104  ?CO2  --  23 20*  ?GLUCOSE  --  110* 95  ?BUN  --  15 12  ?CREATININE  --  0.78 0.57*  ?CALCIUM  --  9.6 8.7*  ?MG 1.7  --   --   ? ? ?GFR: ?Estimated Creatinine Clearance: 109.3 mL/min (A) (by C-G formula based on SCr of 0.57 mg/dL (L)). ?Recent Labs  ?Lab 12/13/21 ?1115 12/14/21 ?0330  ?WBC 5.5 3.8*  ? ? ? ?Liver Function Tests: ?Recent Labs  ?Lab  12/13/21 ?1115  ?AST 134*  ?ALT 104*  ?ALKPHOS 156*  ?BILITOT 1.4*  ?PROT 8.0  ?ALBUMIN 3.7  ? ? ?Recent Labs  ?Lab 12/13/21 ?1115  ?LIPASE 70*  ? ?No results for input(s): AMMONIA in the last 168 hours. ? ?ABG ?No results found for: PHART, PCO2ART, PO2ART, HCO3, TCO2, ACIDBASEDEF, O2SAT  ? ?Coagulation Profile: ?No results for input(s): INR, PROTIME in the last 168 hours. ? ?Cardiac Enzymes: ?No results for input(s): CKTOTAL, CKMB, CKMBINDEX, TROPONINI in the last 168 hours. ? ?HbA1C: ?No results found for: HGBA1C ? ?CBG: ?Recent Labs  ?Lab 12/13/21 ?2027  ?GLUCAP 91  ? ? ?Review of Systems:   ?Positives in BOLD: currently denies all complaints ?Gen: Denies fever, chills, weight change, fatigue, night sweats ?HEENT: Denies blurred vision, double vision, hearing loss, tinnitus, sinus congestion, rhinorrhea, sore throat, neck stiffness, dysphagia ?PULM: Denies shortness of breath, cough, sputum production, hemoptysis, wheezing ?CV: Denies chest pain, edema, orthopnea, paroxysmal nocturnal dyspnea, palpitations ?GI: Denies abdominal pain, nausea, vomiting, diarrhea, hematochezia, melena, constipation, change in bowel habits ?GU: Denies dysuria, hematuria, polyuria, oliguria, urethral discharge ?Endocrine: Denies hot or cold intolerance, polyuria, polyphagia or appetite change ?Derm: Denies rash, dry skin, scaling or peeling skin change ?Heme: Denies easy bruising, bleeding, bleeding gums ?Neuro: Denies headache, numbness, weakness, slurred speech,  loss of memory or consciousness ? ? ? ?Past Medical History:  ?He,  has a past medical history of Hypertension and Pancreatitis.  ? ?Surgical History:  ?History reviewed. No pertinent surgical history.  ? ?Social History:  ? reports that he has been smoking cigarettes. He does not have any smokeless tobacco history on file. He reports current alcohol use. He reports current drug use. Drug: Marijuana.  ? ?Family History:  ?His family history is not on file.  ? ?Allergies ?No  Known Allergies  ? ?Home Medications  ?Prior to Admission medications   ?Medication Sig Start Date End Date Taking? Authorizing Provider  ?ibuprofen (ADVIL) 200 MG tablet Take 400 mg by mouth every 6 (six) hours as needed for moderate pain.

## 2021-12-14 NOTE — Progress Notes (Signed)
Pt wanted me to put a note in that it is ok for [place] to talk to his sister April if she calls for updates. ?

## 2021-12-14 NOTE — TOC Initial Note (Incomplete)
Transition of Care (TOC) - Initial/Assessment Note  ? ? ?Patient Details  ?Name: Travis Leach ?MRN: [id number] ?Date of Birth: 06-10-1976 ? ?Transition of Care (TOC) CM/SW Contact:    ?Shelbie Hutching, RN ?Phone Number: ?12/14/2021, 2:27 PM ? ?Clinical Narrative:                 ?Patient admitted to the hospital for alcohol withdrawal he was also positive for cocaine and marijuana.  Patient is currently in the ICU stepdown status  ? ?Expected Discharge Plan: Home/Self Care ?Barriers to Discharge: Continued Medical Work up ? ? ?Patient Goals and CMS Choice ?Patient states their goals for this hospitalization and ongoing recovery are:: patient unable to state at this time ?  ?  ? ?Expected Discharge Plan and Services ?Expected Discharge Plan: Home/Self Care ?  ?  ?  ?  ?                ?  ?  ?  ?  ?  ?  ?  ?  ?  ?  ? ?Prior Living Arrangements/Services ?  ?  ?  ?       ?  ?  ?  ?  ? ?Activities of Daily Living ?  ?  ? ?Permission Sought/Granted ?  ?  ?   ?   ?   ?   ? ?Emotional Assessment ?  ?  ?  ?  ?Alcohol / Substance Use: Alcohol Use ?Psych Involvement: Yes (comment) ? ?Admission diagnosis:  Alcohol withdrawal ([place]) [F10.939] ?Elevated LFTs [R79.89] ?Alcohol withdrawal syndrome without complication ([place]) 0000000 ?Patient Active Problem List  ? Diagnosis Date Noted  ? Alcohol withdrawal ([place]) 12/13/2021  ? Hypokalemia 12/13/2021  ? Alcohol abuse 12/13/2021  ? Nicotine dependence 12/13/2021  ? Hypertension   ? Thrombocytopenia ([place])   ? ?PCP:  Pcp, No ?Pharmacy:   ?[facility] ?[place] ?[place] [address] ?Phone: [phone] Fax: [phone] ? ? ? ? ?Social Determinants of Health (SDOH) Interventions ?  ? ?Readmission Risk Interventions ?No flowsheet data found. ? ? ?

## 2021-12-14 NOTE — Assessment & Plan Note (Addendum)
Right upper quadrant ultrasound shows hepatic steatosis and did not show findings of cirrhosis.  Patient interested in stopping drinking.  I prescribed thiamine folic acid and multivitamin upon disposition. ?

## 2021-12-14 NOTE — Assessment & Plan Note (Signed)
Advised not to use cocaine anymore can cause heart attack and stroke. ?

## 2021-12-14 NOTE — Progress Notes (Signed)
?  Progress Note ? ? ?Patient: Travis Leach [id number] DOB: November 13, 1975 DOA: 12/13/2021     1 ?DOS: the patient was seen and examined on 12/14/2021 ?  ? ? ?Assessment and Plan: ?* Delirium tremens (HCC) ?Patient on Precedex drip and phenobarbital.  On high-dose thiamine for 3 doses.  Continue to monitor for further signs of withdrawal.  Patient answering questions appropriately at this point.  Patient interested in stopping drinking. ? ?Alcohol abuse ?Right upper quadrant ultrasound to evaluate for cirrhosis.  Patient also has elevated liver function test and pancytopenia likely secondary to alcohol. ? ?Pancytopenia (HCC) ?Likely secondary to alcohol.  White blood cell count 3.8, hemoglobin 12.4 and platelet count of 69. ? ?Elevated liver function tests ?Secondary to alcohol.  Alkaline phosphatase 156, AST 134, ALT 104, total bilirubin 1.4. ? ?Hypokalemia ?Replaced into normal range.  We will give oral magnesium also ? ?Hypertension ?Started on low-dose Norvasc. ? ?Cocaine abuse (HCC) ?Advised not to use cocaine anymore can cause heart attack and stroke. ? ?Nicotine dependence ?Nicotine patch ? ? ? ? ?  ? ?Subjective: Patient seen this morning.  He states he is feeling okay.  Answers questions appropriately.  No complaints of pain but does have some numbness in his toes.  Admitted with alcohol withdrawal ? ?Physical Exam: ?Vitals:  ? 12/14/21 0300 12/14/21 0400 12/14/21 0500 12/14/21 0600  ?BP: (!) 129/99 (!) 140/104 (!) 146/105 (!) 157/108  ?Pulse: 77 77 78 81  ?Resp:      ?Temp:  (!) 97 ?F (36.1 ?C)    ?TempSrc:  Axillary    ?SpO2: 99% 100% 100% 100%  ?Weight:      ?Height:      ? ?Physical Exam ?HENT:  ?   Head: Normocephalic.  ?   Mouth/Throat:  ?   Pharynx: No oropharyngeal exudate.  ?Eyes:  ?   General: Lids are normal.  ?   Conjunctiva/sclera: Conjunctivae normal.  ?Cardiovascular:  ?   Rate and Rhythm: Normal rate and regular rhythm.  ?   Heart sounds: Normal heart sounds, S1 normal and S2 normal.   ?Pulmonary:  ?   Breath sounds: Normal breath sounds. No decreased breath sounds, wheezing, rhonchi or rales.  ?Abdominal:  ?   Palpations: Abdomen is soft.  ?   Tenderness: There is no abdominal tenderness.  ?Musculoskeletal:  ?   Right ankle: No swelling.  ?   Left ankle: No swelling.  ?Skin: ?   General: Skin is warm.  ?   Findings: No rash.  ?Neurological:  ?   Mental Status: He is alert.  ?   Comments: Slight tremor  ?  ?Data Reviewed: ?Laboratory and radiological data reviewed during this hospital course.  Patient with pancytopenia white blood count 3.8 hemoglobin 12.4 and platelet count of 69.  Magnesium 1.7, AST 134 ALT 104 and alkaline phosphatase 156 ? ?Family Communication: Spoke with patient's father on the phone ? ?Disposition: ?Status is: Inpatient ?Remains inpatient appropriate because: He is on Precedex drip and being treated for delirium tremens. ? ?Planned Discharge Destination: Home ? ?Author: ?Alford Highland, MD ?12/14/2021 2:54 PM ? ?For on call review [email or web address].  ?

## 2021-12-15 DIAGNOSIS — E871 Hypo-osmolality and hyponatremia: Secondary | ICD-10-CM

## 2021-12-15 LAB — COMPREHENSIVE METABOLIC PANEL
ALT: 65 U/L — ABNORMAL HIGH (ref 0–44)
AST: 65 U/L — ABNORMAL HIGH (ref 15–41)
Albumin: 3.3 g/dL — ABNORMAL LOW (ref 3.5–5.0)
Alkaline Phosphatase: 132 U/L — ABNORMAL HIGH (ref 38–126)
Anion gap: 9 (ref 5–15)
BUN: 6 mg/dL (ref 6–20)
CO2: 23 mmol/L (ref 22–32)
Calcium: 8.8 mg/dL — ABNORMAL LOW (ref 8.9–10.3)
Chloride: 98 mmol/L (ref 98–111)
Creatinine, Ser: 0.78 mg/dL (ref 0.61–1.24)
GFR, Estimated: >60 mL/min (ref >60)
Glucose, Bld: 93 mg/dL (ref 70–99)
Potassium: 3.8 mmol/L (ref 3.5–5.1)
Sodium: 130 mmol/L — ABNORMAL LOW (ref 135–145)
Total Bilirubin: 1 mg/dL (ref 0.3–1.2)
Total Protein: 6.6 g/dL (ref 6.5–8.1)

## 2021-12-15 LAB — CBC
HCT: 37.7 % — ABNORMAL LOW (ref 39.0–52.0)
Hemoglobin: 12.2 g/dL — ABNORMAL LOW (ref 13.0–17.0)
MCH: 28 pg (ref 26.0–34.0)
MCHC: 32.4 g/dL (ref 30.0–36.0)
MCV: 86.7 fL (ref 80.0–100.0)
Platelets: 84 10*3/uL — ABNORMAL LOW (ref 150–400)
RBC: 4.35 MIL/uL (ref 4.22–5.81)
RDW: 12.5 % (ref 11.5–15.5)
WBC: 5.2 10*3/uL (ref 4.0–10.5)
nRBC: 0 % (ref 0.0–0.2)

## 2021-12-15 LAB — MAGNESIUM: Magnesium: 1.4 mg/dL — ABNORMAL LOW (ref 1.7–2.4)

## 2021-12-15 LAB — PHOSPHORUS: Phosphorus: 3.6 mg/dL (ref 2.5–4.6)

## 2021-12-15 MED ORDER — MAGNESIUM SULFATE 4 GM/100ML IV SOLN
4.0000 g | Freq: Once | INTRAVENOUS | Status: AC
Start: 2021-12-15 — End: 2021-12-15
  Administered 2021-12-15: 4 g via INTRAVENOUS
  Filled 2021-12-15: qty 100

## 2021-12-15 MED ORDER — METOPROLOL SUCCINATE ER 25 MG PO TB24
25.0000 mg | ORAL_TABLET | Freq: Every day | ORAL | Status: DC
  Administered 2021-12-15: 25 mg via ORAL
  Filled 2021-12-15: qty 1

## 2021-12-15 MED ORDER — AMLODIPINE BESYLATE 5 MG PO TABS: 5.0000 mg | ORAL_TABLET | Freq: Once | ORAL | Status: DC

## 2021-12-15 MED ORDER — AMLODIPINE BESYLATE 10 MG PO TABS
10.0000 mg | ORAL_TABLET | Freq: Every day | ORAL | Status: DC
  Administered 2021-12-16: 10 mg via ORAL
  Filled 2021-12-15: qty 1

## 2021-12-15 NOTE — Assessment & Plan Note (Addendum)
Secondary to alcohol abuse.  Sodium 131 today. ?

## 2021-12-15 NOTE — Plan of Care (Signed)
ANOx4. DBP remains 90-110s. Asymptomatic. Denies pain. Precedex gtt weaned off ~2200. PRN valium given x1 for anxiety.  ?Problem: Education: ?Goal: Knowledge of General Education information will improve ?Description: Including pain rating scale, medication(s)/side effects and non-pharmacologic comfort measures ?Outcome: Progressing ?  ?Problem: Health Behavior/Discharge Planning: ?Goal: Ability to manage health-related needs will improve ?Outcome: Progressing ?  ?Problem: Clinical Measurements: ?Goal: Ability to maintain clinical measurements within normal limits will improve ?Outcome: Progressing ?Goal: Will remain free from infection ?Outcome: Progressing ?Goal: Diagnostic test results will improve ?Outcome: Progressing ?Goal: Respiratory complications will improve ?Outcome: Progressing ?Goal: Cardiovascular complication will be avoided ?Outcome: Progressing ?  ?Problem: Activity: ?Goal: Risk for activity intolerance will decrease ?Outcome: Progressing ?  ?Problem: Nutrition: ?Goal: Adequate nutrition will be maintained ?Outcome: Progressing ?  ?Problem: Coping: ?Goal: Level of anxiety will decrease ?Outcome: Progressing ?  ?Problem: Elimination: ?Goal: Will not experience complications related to bowel motility ?Outcome: Progressing ?Goal: Will not experience complications related to urinary retention ?Outcome: Progressing ?  ?Problem: Pain Managment: ?Goal: General experience of comfort will improve ?Outcome: Progressing ?  ?Problem: Safety: ?Goal: Ability to remain free from injury will improve ?Outcome: Progressing ?  ?Problem: Skin Integrity: ?Goal: Risk for impaired skin integrity will decrease ?Outcome: Progressing ?  ?

## 2021-12-15 NOTE — Progress Notes (Signed)
?  Progress Note ? ? ?Patient: Travis Leach ZHG:[id number] DOB: [birth year] DOA: 12/13/2021     2 ?DOS: the patient was seen and examined on 12/15/2021 ?  ? ? ?Assessment and Plan: ?* Delirium tremens (HCC) ?Patient on Precedex drip discontinued last night.  Continue phenobarbital taper.  Continue thiamine.  If does well overnight and with ambulation potential discharge tomorrow ? ?Alcohol abuse ?Right upper quadrant ultrasound shows hepatic steatosis and did not show findings of cirrhosis.  Patient interested in stopping drinking. ? ?Hypomagnesemia ?Replace IV magnesium today.  Magnesium 1.4.  We will also give oral magnesium. ? ?Pancytopenia (HCC) ?Likely secondary to alcohol.  White blood cell count has come up to 5.2.  Hemoglobin stable at 12.2.  Platelet count come up from 69-84. ? ?Elevated liver function tests ?Secondary to alcohol.  AST and ALT both trending better and both are 65 today.  Total bilirubin 1.0 and alkaline phosphatase down to 132. ? ?Hypokalemia ?Replaced into normal range.  ? ?Hypertension ?Started on low-dose Norvasc.  Will increase to 10 mg.  Add Toprol-XL. ? ?Hyponatremia ?Secondary to alcohol abuse.  Sodium 130 today. ? ?Cocaine abuse (HCC) ?Advised not to use cocaine anymore can cause heart attack and stroke. ? ?Nicotine dependence ?Nicotine patch ? ? ? ? ?  ? ?Subjective: Patient feeling better.  Does not have any shakiness.  Answers questions appropriately.  Offers no complaints.  Tolerating diet.  Admitted with delirium tremens. ? ?Physical Exam: ?Vitals:  ? 12/15/21 0700 12/15/21 0744 12/15/21 0800 12/15/21 1047  ?BP: (!) 166/117  (!) 152/111 (!) 145/97  ?Pulse: 99 93 93 100  ?Resp: 14 20 (!) 23 20  ?Temp:      ?TempSrc:      ?SpO2: 100% 99% 99%   ?Weight:      ?Height:      ? ?Physical Exam ?HENT:  ?   Head: Normocephalic.  ?   Mouth/Throat:  ?   Pharynx: No oropharyngeal exudate.  ?Eyes:  ?   General: Lids are normal.  ?   Conjunctiva/sclera: Conjunctivae normal.  ?Cardiovascular:  ?    Rate and Rhythm: Regular rhythm. Tachycardia present.  ?   Heart sounds: Normal heart sounds, S1 normal and S2 normal.  ?Pulmonary:  ?   Breath sounds: Normal breath sounds. No decreased breath sounds, wheezing, rhonchi or rales.  ?Abdominal:  ?   Palpations: Abdomen is soft.  ?   Tenderness: There is no abdominal tenderness.  ?Musculoskeletal:  ?   Right ankle: No swelling.  ?   Left ankle: No swelling.  ?Skin: ?   General: Skin is warm.  ?   Findings: No rash.  ?Neurological:  ?   Mental Status: He is alert.  ?   Comments: Slight tremor  ?  ?Data Reviewed: ? ?Laboratory and radiological data reviewed.  Sodium 130, magnesium 1.4, AST and ALT both 65, hemoglobin 12.2, platelets 84 ? ?Family Communication: Updated father on the phone ? ?Disposition: ?Status is: Inpatient ?Remains inpatient appropriate because: Completing inpatient treatment for DTs with phenobarbital. ? ?Planned Discharge Destination: Home potentially tomorrow ? ? ?Author: ?Alford Highland, MD ?12/15/2021 2:29 PM ? ?For on call review [email or web address].  ?

## 2021-12-15 NOTE — Assessment & Plan Note (Addendum)
Magnesium replaced into the normal range upon disposition ?

## 2021-12-16 DIAGNOSIS — E871 Hypo-osmolality and hyponatremia: Secondary | ICD-10-CM

## 2021-12-16 LAB — BASIC METABOLIC PANEL
Anion gap: 9 (ref 5–15)
BUN: 7 mg/dL (ref 6–20)
CO2: 24 mmol/L (ref 22–32)
Calcium: 9.1 mg/dL (ref 8.9–10.3)
Chloride: 98 mmol/L (ref 98–111)
Creatinine, Ser: 0.75 mg/dL (ref 0.61–1.24)
GFR, Estimated: >60 mL/min (ref >60)
Glucose, Bld: 93 mg/dL (ref 70–99)
Potassium: 4.2 mmol/L (ref 3.5–5.1)
Sodium: 131 mmol/L — ABNORMAL LOW (ref 135–145)

## 2021-12-16 LAB — CBC
HCT: 40.3 % (ref 39.0–52.0)
Hemoglobin: 13.2 g/dL (ref 13.0–17.0)
MCH: 28.3 pg (ref 26.0–34.0)
MCHC: 32.8 g/dL (ref 30.0–36.0)
MCV: 86.5 fL (ref 80.0–100.0)
Platelets: 122 10*3/uL — ABNORMAL LOW (ref 150–400)
RBC: 4.66 MIL/uL (ref 4.22–5.81)
RDW: 12.9 % (ref 11.5–15.5)
WBC: 5.2 10*3/uL (ref 4.0–10.5)
nRBC: 0 % (ref 0.0–0.2)

## 2021-12-16 LAB — MAGNESIUM: Magnesium: 2 mg/dL (ref 1.7–2.4)

## 2021-12-16 MED ORDER — ADULT MULTIVITAMIN W/MINERALS CH: 1.0000 | ORAL_TABLET | Freq: Every day | ORAL | 0 refills | Status: DC

## 2021-12-16 MED ORDER — NICOTINE 21 MG/24HR TD PT24: MEDICATED_PATCH | TRANSDERMAL | 0 refills | Status: DC

## 2021-12-16 MED ORDER — PHENOBARBITAL 32.4 MG PO TABS
ORAL_TABLET | ORAL | 0 refills | Status: DC
Start: 2021-12-16 — End: 2022-01-03

## 2021-12-16 MED ORDER — FOLIC ACID 1 MG PO TABS: 1.0000 mg | ORAL_TABLET | Freq: Every day | ORAL | 0 refills | Status: DC

## 2021-12-16 MED ORDER — THIAMINE HCL 100 MG PO TABS: 100.0000 mg | ORAL_TABLET | Freq: Every day | ORAL | 0 refills | Status: DC

## 2021-12-16 MED ORDER — AMLODIPINE BESYLATE 10 MG PO TABS: 10.0000 mg | ORAL_TABLET | Freq: Every day | ORAL | 0 refills | Status: DC

## 2021-12-16 MED ORDER — MAGNESIUM OXIDE -MG SUPPLEMENT 400 (240 MG) MG PO TABS: 400.0000 mg | ORAL_TABLET | Freq: Every day | ORAL | 0 refills | Status: DC

## 2021-12-16 NOTE — Discharge Summary (Signed)
?Physician Discharge Summary ?  ?Patient: Travis Leach MRN: [id number] DOB: [birth year]  ?Admit date:     12/13/2021  ?Discharge date: 12/16/21  ?Discharge Physician: Loletha Grayer  ? ?PCP: Pcp, No  ? ?Recommendations at discharge:  ? ?[facility] ? ?Discharge Diagnoses: ?Principal Problem: ?  Delirium tremens ([place]) ?Active Problems: ?  Alcohol abuse ?  Hypomagnesemia ?  Pancytopenia ([place]) ?  Elevated liver function tests ?  Hypokalemia ?  Hypertension ?  Nicotine dependence ?  Cocaine abuse ([place]) ?  Hyponatremia ? ? ?Hospital Course: ?The patient was admitted to the hospital on 12/13/2021 and discharged on 12/17/2018.  The patient was admitted with delirium tremens and started on Precedex drip and then added on phenobarbital.  The patient improved and mental status improved.  At the time of discharge she was not having any alcohol withdrawal.  He was switched to a phenobarbital taper upon disposition.  Patient discharged home in stable condition.  His father will look into outpatient alcohol detox programs. ? ?Assessment and Plan: ?* Delirium tremens ([place]) ?Initially patient was on Precedex drip.  Phenobarbital was added.  I will taper phenobarbital over a couple more days upon disposition and then stop. ? ?Alcohol abuse ?Right upper quadrant ultrasound shows hepatic steatosis and did not show findings of cirrhosis.  Patient interested in stopping drinking.  I prescribed thiamine folic acid and multivitamin upon disposition. ? ?Hypomagnesemia ?Magnesium replaced into the normal range upon disposition ? ?Pancytopenia ([place]) ?Likely secondary to alcohol.  White blood cell count has come up to 5.2.  Hemoglobin came up to 13.2 and platelet count came up to 122 upon disposition. ? ?Elevated liver function tests ?Secondary to alcohol.  AST and ALT both trending better and both are 65 as of yesterday.  Total bilirubin 1.0 and alkaline phosphatase down to 132. ? ?Hypokalemia ?Replaced into normal range.   ? ?Hypertension ?Increase Norvasc to 10 mg daily.  Blood pressure elevated most of the hospital stay.  Recommending rechecking blood pressure as outpatient and adjusting medications and adding if needed. ? ?Hyponatremia ?Secondary to alcohol abuse.  Sodium 131 today. ? ?Cocaine abuse ([place]) ?Advised not to use cocaine anymore can cause heart attack and stroke. ? ?Nicotine dependence ?Nicotine patch ? ? ? ? ?  ? ? ?Consultants: Critical care specialist ?Procedures performed: None ?Disposition: Home ?Diet recommendation:  ?Cardiac diet ?DISCHARGE MEDICATION: ?Allergies as of 12/16/2021   ?No Known Allergies ?  ? ?  ?Medication List  ?  ? ?STOP taking these medications   ? ?ibuprofen 200 MG tablet ?Commonly known as: ADVIL ?  ?ondansetron 8 MG disintegrating tablet ?Commonly known as: Zofran ODT ?  ?pantoprazole 20 MG tablet ?Commonly known as: PROTONIX ?  ? ?  ? ?TAKE these medications   ? ?amLODipine 10 MG tablet ?Commonly known as: NORVASC ?Take 1 tablet (10 mg total) by mouth daily. ?  ?folic acid 1 MG tablet ?Commonly known as: FOLVITE ?Take 1 tablet (1 mg total) by mouth daily. ?  ?magnesium oxide 400 (240 Mg) MG tablet ?Commonly known as: MAG-OX ?Take 1 tablet (400 mg total) by mouth daily. ?  ?multivitamin with minerals Tabs tablet ?Take 1 tablet by mouth daily. ?  ?nicotine 21 mg/24hr patch ?Commonly known as: NICODERM CQ - dosed in mg/24 hours ?One patch chest wall daily (okay to substitute generic) ?  ?PHENobarbital 32.4 MG tablet ?Commonly known as: LUMINAL ?Two tabs po twice a day for two days, Then One tablet twice a day  for two days then stop ?  ?thiamine 100 MG tablet ?Take 1 tablet (100 mg total) by mouth daily. ?  ? ?  ? ? Follow-up Information   ? ? [facility] up in 2 week(s).   ?Specialty: Primary Care ?Contact information: ?[address] ?[address] ?[place] ?[phone] ? ?  ?  ? ?  ?  ? ?  ? ?Discharge Exam: ?Filed Weights  ? 12/13/21 1112  12/13/21 2015  ?Weight: 77.1 kg 65.6 kg  ? ?Physical Exam ?HENT:  ?   Head: Normocephalic.  ?   Mouth/Throat:  ?   Pharynx: No oropharyngeal exudate.  ?Eyes:  ?   General: Lids are normal.  ?   Conjunctiva/sclera: Conjunctivae normal.  ?Cardiovascular:  ?   Rate and Rhythm: Normal rate and regular rhythm.  ?   Heart sounds: Normal heart sounds, S1 normal and S2 normal.  ?Pulmonary:  ?   Breath sounds: Normal breath sounds. No decreased breath sounds, wheezing, rhonchi or rales.  ?Abdominal:  ?   Palpations: Abdomen is soft.  ?   Tenderness: There is no abdominal tenderness.  ?Musculoskeletal:  ?   Right ankle: No swelling.  ?   Left ankle: No swelling.  ?Skin: ?   General: Skin is warm.  ?   Findings: No rash.  ?Neurological:  ?   Mental Status: He is alert.  ?   Comments: Slight tremor  ?  ? ?Condition at discharge: stable ? ?The results of significant diagnostics from this hospitalization (including imaging, microbiology, ancillary and laboratory) are listed below for reference.  ? ?Imaging Studies: ?US Abdomen Limited RUQ (LIVER/GB) ? ?Result Date: 12/14/2021 ?CLINICAL DATA:  Elevated liver function tests. EXAM: ULTRASOUND ABDOMEN LIMITED RIGHT UPPER QUADRANT COMPARISON:  None. FINDINGS: Gallbladder: No gallstones or wall thickening visualized (1.8 mm). No sonographic Murphy sign noted by sonographer. Common bile duct: Diameter: 3.8 mm Liver: No focal lesion identified. Diffusely increased echogenicity of the liver parenchyma is noted. Portal vein is patent on color Doppler imaging with normal direction of blood flow towards the liver. Other: None. IMPRESSION: Hepatic steatosis without focal liver lesions. Electronically Signed   By: Virgina Norfolk M.D.   On: 12/14/2021 15:34   ? ?Microbiology: ?Results for orders placed or performed during the hospital encounter of 12/13/21  ?Resp Panel by RT-PCR (Flu A&B, Covid) Nasopharyngeal Swab     Status: None  ? Collection Time: 12/13/21 12:14 PM  ? Specimen:  Nasopharyngeal Swab; Nasopharyngeal(NP) swabs in vial transport medium  ?Result Value Ref Range Status  ? SARS Coronavirus 2 by RT PCR NEGATIVE NEGATIVE Final  ?  Comment: (NOTE) ?SARS-CoV-2 target nucleic acids are NOT DETECTED. ? ?The SARS-CoV-2 RNA is generally detectable in upper respiratory ?specimens during the acute phase of infection. The lowest ?concentration of SARS-CoV-2 viral copies this assay can detect is ?138 copies/mL. A negative result does not preclude SARS-Cov-2 ?infection and should not be used as the sole basis for treatment or ?other patient management decisions. A negative result may occur with  ?improper specimen collection/handling, submission of specimen other ?than nasopharyngeal swab, presence of viral mutation(s) within the ?areas targeted by this assay, and inadequate number of viral ?copies(<138 copies/mL). A negative result must be combined with ?clinical observations, patient history, and epidemiological ?information. The expected result is Negative. ? ?Fact Sheet for Patients:  ?EntrepreneurPulse.com.au ? ?Fact Sheet for Healthcare Providers:  ?IncredibleEmployment.be ? ?This test is no t yet approved or cleared  by the [place] and  ?has been authorized for detection and/or diagnosis of SARS-CoV-2 by ?FDA under an Emergency Use Authorization (EUA). This EUA will remain  ?in effect (meaning this test can be used) for the duration of the ?COVID-19 declaration under Section 564(b)(1) of the Act, 21 ?U.S.C.section 360bbb-3(b)(1), unless the authorization is terminated  ?or revoked sooner.  ? ? ?  ? Influenza A by PCR NEGATIVE NEGATIVE Final  ? Influenza B by PCR NEGATIVE NEGATIVE Final  ?  Comment: (NOTE) ?The Xpert Xpress SARS-CoV-2/FLU/RSV plus assay is intended as an aid ?in the diagnosis of influenza from Nasopharyngeal swab specimens and ?should not be used as a sole basis for treatment. Nasal washings and ?aspirates are unacceptable for  Xpert Xpress SARS-CoV-2/FLU/RSV ?testing. ? ?Fact Sheet for Patients: ?EntrepreneurPulse.com.au ? ?Fact Sheet for Healthcare Providers: ?IncredibleEmployment.be ? ?This test is not yet appr

## 2021-12-16 NOTE — Plan of Care (Signed)

## 2021-12-16 NOTE — Progress Notes (Signed)
Patient discharge instruction gone over and the patient verbally expressed understanding. Both IV were taken out. All belongings sent with the patient. Patient wheeled to medical mall by staff. Patient assisted into car with Dad taking him home.  ?

## 2022-01-03 ENCOUNTER — Ambulatory Visit: Payer: Self-pay | Admitting: Adult Health

## 2022-01-03 ENCOUNTER — Other Ambulatory Visit: Payer: Self-pay

## 2022-01-03 ENCOUNTER — Encounter: Payer: Self-pay | Admitting: Adult Health

## 2022-01-03 VITALS — BP 120/87 | HR 105 | Temp 98.7°F | Resp 16 | Ht 72.0 in | Wt 152.7 lb

## 2022-01-03 DIAGNOSIS — R7989 Other specified abnormal findings of blood chemistry: Secondary | ICD-10-CM

## 2022-01-03 DIAGNOSIS — F17210 Nicotine dependence, cigarettes, uncomplicated: Secondary | ICD-10-CM

## 2022-01-03 DIAGNOSIS — Z0000 Encounter for general adult medical examination without abnormal findings: Secondary | ICD-10-CM

## 2022-01-03 DIAGNOSIS — F321 Major depressive disorder, single episode, moderate: Secondary | ICD-10-CM

## 2022-01-03 DIAGNOSIS — G629 Polyneuropathy, unspecified: Secondary | ICD-10-CM

## 2022-01-03 DIAGNOSIS — F1021 Alcohol dependence, in remission: Secondary | ICD-10-CM

## 2022-01-03 DIAGNOSIS — F10982 Alcohol use, unspecified with alcohol-induced sleep disorder: Secondary | ICD-10-CM

## 2022-01-03 MED ORDER — VITAMIN B-1 100 MG PO TABS
100.0000 mg | ORAL_TABLET | Freq: Every day | ORAL | 2 refills | Status: DC
  Filled 2022-01-03: qty 30, 30d supply, fill #0
  Filled 2022-02-01: qty 30, 30d supply, fill #1
  Filled 2022-03-08: qty 30, 30d supply, fill #0
  Filled 2022-05-20: qty 30, 30d supply, fill #1

## 2022-01-03 MED ORDER — NICOTINE 21 MG/24HR TD PT24
MEDICATED_PATCH | TRANSDERMAL | 2 refills | Status: DC
  Filled 2022-01-03: qty 30, fill #0

## 2022-01-03 MED ORDER — CITALOPRAM HYDROBROMIDE 10 MG PO TABS
10.0000 mg | ORAL_TABLET | Freq: Every day | ORAL | 2 refills | Status: DC
  Filled 2022-01-03: qty 30, 30d supply, fill #0
  Filled 2022-02-01: qty 30, 30d supply, fill #1
  Filled 2022-03-08: qty 30, 30d supply, fill #0

## 2022-01-03 MED ORDER — MELATONIN 10 MG PO CAPS
10.0000 mg | ORAL_CAPSULE | Freq: Every day | ORAL | 2 refills | Status: DC
  Filled 2022-01-03: qty 30, fill #0

## 2022-01-03 MED ORDER — VITAMIN D (ERGOCALCIFEROL) 1.25 MG (50000 UNIT) PO CAPS
50000.0000 [IU] | ORAL_CAPSULE | ORAL | 3 refills | Status: DC
  Filled 2022-01-03: qty 4, 28d supply, fill #0
  Filled 2022-02-01: qty 4, 28d supply, fill #1
  Filled 2022-03-08: qty 4, 28d supply, fill #0
  Filled 2022-04-08: qty 4, 28d supply, fill #1
  Filled 2022-05-20: qty 4, 28d supply, fill #2

## 2022-01-03 MED ORDER — MAGNESIUM OXIDE -MG SUPPLEMENT 400 (240 MG) MG PO TABS
400.0000 mg | ORAL_TABLET | Freq: Every day | ORAL | 2 refills | Status: DC
  Filled 2022-01-03: qty 30, 30d supply, fill #0

## 2022-01-03 MED ORDER — VITAMIN B-12 500 MCG PO TABS
1000.0000 ug | ORAL_TABLET | Freq: Two times a day (BID) | ORAL | 2 refills | Status: DC
  Filled 2022-01-03: qty 70, 18d supply, fill #0
  Filled 2022-01-18: qty 70, 18d supply, fill #1
  Filled 2022-02-01: qty 120, 30d supply, fill #2
  Filled 2022-03-08: qty 120, 30d supply, fill #0
  Filled 2022-05-20: qty 100, 25d supply, fill #1

## 2022-01-03 MED ORDER — FOLIC ACID 1 MG PO TABS
1.0000 mg | ORAL_TABLET | Freq: Every day | ORAL | 2 refills | Status: DC
  Filled 2022-01-03: qty 30, 30d supply, fill #0
  Filled 2022-02-01: qty 30, 30d supply, fill #1
  Filled 2022-03-08: qty 30, 30d supply, fill #0
  Filled 2022-04-08: qty 30, 30d supply, fill #1
  Filled 2022-05-20: qty 30, 30d supply, fill #2

## 2022-01-03 MED ORDER — AMLODIPINE BESYLATE 10 MG PO TABS
10.0000 mg | ORAL_TABLET | Freq: Every day | ORAL | 2 refills | Status: DC
  Filled 2022-01-03: qty 30, 30d supply, fill #0
  Filled 2022-02-01: qty 25, 25d supply, fill #1
  Filled 2022-03-08: qty 30, 30d supply, fill #0
  Filled 2022-04-08: qty 30, 30d supply, fill #1
  Filled 2022-05-20: qty 30, 30d supply, fill #2

## 2022-01-03 NOTE — Patient Instructions (Signed)
Major Depressive Disorder, Adult °Major depressive disorder is a mental health condition. This disorder affects feelings. It can also affect the body. Symptoms of this condition last most of the day, almost every day, for 2 weeks. This disorder can affect: °Relationships. °Daily activities, such as work and school. °Activities that you normally like to do. °What are the causes? °The cause of this condition is not known. The disorder is likely caused by a mix of things, including: °Your personality, such as being a shy person. °Your behavior, or how you act toward others. °Your thoughts and feelings. °Too much alcohol or drugs. °How you react to stress. °Health and mental problems that you have had for a long time. °Things that hurt you in the past (trauma). °Big changes in your life, such as divorce. °What increases the risk? °The following factors may make you more likely to develop this condition: °Having family members with depression. °Being a woman. °Problems in the family. °Low levels of some brain chemicals. °Things that caused you pain as a child, especially if you lost a parent or were abused. °A lot of stress in your life, such as from: °Living without basic needs of life, such as food and shelter. °Being treated poorly because of race, sex, or religion (discrimination). °Health and mental problems that you have had for a long time. °What are the signs or symptoms? °The main symptoms of this condition are: °Being sad all the time. °Being grouchy all the time. °Loss of interest in things and activities. °Other symptoms include: °Sleeping too much or too little. °Eating too much or too little. °Gaining or losing weight, without knowing why. °Feeling tired or having low energy. °Being restless and weak. °Feeling hopeless, worthless, or guilty. °Trouble thinking clearly or making decisions. °Thoughts of hurting yourself or others, or thoughts of ending your life. °Spending a lot of time alone. °Inability to  complete common tasks of daily life. °If you have very bad MDD, you may: °Believe things that are not true. °Hear, see, taste, or feel things that are not there. °Have mild depression that lasts for at least 2 years. °Feel very sad and hopeless. °Have trouble speaking or moving. °How is this treated? °This condition may be treated with: °Talk therapy. This teaches you to know bad thoughts, feelings, and actions and how to change them. °This can also help you to communicate with others. °This can be done with members of your family. °Medicines. These can be used to treat worry (anxiety), depression, or low levels of chemicals in the brain. °Lifestyle changes. You may need to: °Limit alcohol use. °Limit drug use. °Get regular exercise. °Get plenty of sleep. °Make healthy eating choices. °Spend more time outdoors. °Brain stimulation. This treatment excites the brain. This is done when symptoms are very bad or have not gotten better with other treatments. °Follow these instructions at home: °Activity °Get regular exercise as told. °Spend time outdoors as told. °Make time to do the things you enjoy. °Find ways to deal with stress. Try to: °Meditate. °Do deep breathing. °Spend time in nature. °Keep a journal. °Return to your normal activities as told by your doctor. Ask your doctor what activities are safe for you. °Alcohol and drug use °If you drink alcohol: °Limit how much you use to: °0-1 drink a day for women. °0-2 drinks a day for men. °Be aware of how much alcohol is in your drink. In the [place] one drink equals one 12 oz bottle of beer (355 mL),   one 5 oz glass of wine (148 mL), or one 1½ oz glass of hard liquor (44 mL). °Talk to your doctor about: °Alcohol use. Alcohol can affect some medicines. °Any drug use. °General instructions ° °Take over-the-counter and prescription medicines and herbal preparations only as told by your doctor. °Eat a healthy diet. °Get a lot of sleep. °Think about joining a support group.  Your doctor may be able to suggest one. °Keep all follow-up visits as told by your doctor. This is important. °Where to find more information: °National Alliance on Mental Illness: www.nami.org °U.S[place] National Institute of Mental Health: www.nimh.nih.gov °American Psychiatric Association: www.psychiatry.org/patients-families/ °Contact a doctor if: °Your symptoms get worse. °You get new symptoms. °Get help right away if: °You hurt yourself. °You have serious thoughts about hurting yourself or others. °You see, hear, taste, smell, or feel things that are not there. °If you ever feel like you may hurt yourself or others, or have thoughts about taking your own life, get help right away. Go to your nearest emergency department or: °Call your local emergency services (911 in the [place] °Call a suicide crisis helpline, such as the National Suicide Prevention Lifeline at 1-800-273-8255 or 988 in the [place] This is open 24 hours a day in the [place] °Text the Crisis Text Line at 741741 (in the [place] °Summary °Major depressive disorder is a mental health condition. This disorder affects feelings. Symptoms of this condition last most of the day, almost every day, for 2 weeks. °The symptoms of this disorder can cause problems with relationships and with daily activities. °There are treatments and support for people who get this disorder. You may need more than one type of treatment. °Get help right away if you have serious thoughts about hurting yourself or others. °This information is not intended to replace advice given to you by your health care provider. Make sure you discuss any questions you have with your health care provider. °Document Revised: 04/11/2021 Document Reviewed: 08/28/2019 °Elsevier Patient Education © 2022 [employer] ° °

## 2022-01-03 NOTE — Progress Notes (Signed)
Patient ID: Travis Leach, male   DOB: [birth year], 46 y.o.   MRN: [id number] ? ?Chief Complaint  ?Patient presents with  ? Follow-up  ? ? ?HPI ?Travis Leach is a 46 y.o. male who presents for an initial office visit and health assessment. He is never had a primary care provider. He has a history alcoholism and has been sober for one month now. He reports sleep difficulty and episodes of hallucinations. Hallucinations have resolved but he still has lots of difficulty falling asleep and staying asleep.  Also reports "heavy breathing" sometimes but denies chest pain, cough and exertional dyspnea. He reports tingling in his toes that has gotten worse over the last couple of weeks.  He states that he has difficulty staying on his feet and working because of the numbness and tingling in his feet.  He has a history of hypertension and his blood pressure has been well controlled.  While he was hospitalized, his BP was high upon discharge.  Patient started drinking at age 70. He is still smoking and has a nicotine patch on now. He exercises daily but had stopped recently because of the numbness and tingling in his feet. He has a remote history of asthma but no recent episodes.  He denies abdominal pain, nausea, vomiting and diarrhea.  His LFTs were elevated during hospitalization but were trending down upon discharge. ?His depression screen is positive for major depression.  He is currently going through a separation with his wife and states that this has made him extremely depressed.  He expresses gratitude for the support of his family.  He just recently moved to [facility] from [place] to be with his father.  He is currently unemployed.  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 ? ? ?Past Medical History:  ?Diagnosis Date  ? Asthma   ? Hypertension   ? Pancreatitis   ? ? ?History reviewed. No pertinent surgical history. ? ?Family History  ?Problem Relation Age of Onset  ?  Diabetes Mother   ? Pancreatitis Mother   ? Dementia Paternal Grandmother   ? ? ?Social History ?Social History  ? ?Tobacco Use  ? Smoking status: Every Day  ?  Packs/day: 0.50  ?  Years: 30.00  ?  Pack years: 15.00  ?  Types: Cigarettes  ?Vaping Use  ? Vaping Use: Never used  ?Substance Use Topics  ? Alcohol use: Not Currently  ?  Comment: has stopped drinking ~ march 1st  ? Drug use: Yes  ?  Types: Marijuana  ?  Comment: occasional THC  ? ? ?No Known Allergies ? ?Current Outpatient Medications  ?Medication Sig Dispense Refill  ? amLODipine (NORVASC) 10 MG tablet Take 1 tablet (10 mg total) by mouth daily. 30 tablet 0  ? folic acid (FOLVITE) 1 MG tablet Take 1 tablet (1 mg total) by mouth daily. 30 tablet 0  ? magnesium oxide (MAG-OX) 400 (240 Mg) MG tablet Take 1 tablet (400 mg total) by mouth daily. 30 tablet 0  ? Multiple Vitamin (MULTIVITAMIN WITH MINERALS) TABS tablet Take 1 tablet by mouth daily. 30 tablet 0  ? nicotine (NICODERM CQ - DOSED IN MG/24 HOURS) 21 mg/24hr patch One patch chest wall daily (okay to substitute generic) 28 patch 0  ? Podiatric Products (AMLACTIN FOOT CREAM THERAPY EX) Apply topically.    ? thiamine 100 MG tablet Take 1 tablet (100 mg total) by mouth daily. 30 tablet 0  ? ?  No current facility-administered medications for this visit.  ? ? ? ?Review of Systems  ?Constitutional:  Positive for fatigue. Negative for appetite change (His appetite was poor but has improved since he stopped drinking).  ?HENT: Negative.    ?Eyes: Negative.   ?Respiratory:  Positive for shortness of breath. Negative for cough, chest tightness and wheezing.   ?Cardiovascular: Negative.   ?Gastrointestinal: Negative.   ?Endocrine: Negative.   ?Genitourinary: Negative.   ?Musculoskeletal: Negative.   ?Skin: Negative.   ?Psychiatric/Behavioral:  Positive for decreased concentration, dysphoric mood, sleep disturbance and suicidal ideas (Not currently). Negative for agitation and hallucinations. The patient is  nervous/anxious.   ? ? ?Blood pressure 120/87, pulse (!) 105, temperature 98.7 ?F (37.1 ?C), temperature source Oral, resp. rate 16, height 6' (1.829 m), weight 152 lb 11.2 oz (69.3 kg), SpO2 98 %. ? ?Physical Exam ?Physical Exam ?Vitals and nursing note reviewed.  ?Constitutional:   ?   Appearance: Normal appearance.  ?HENT:  ?   Head: Normocephalic and atraumatic.  ?   Right Ear: External ear normal.  ?   Left Ear: External ear normal.  ?   Nose: Nose normal.  ?   Mouth/Throat:  ?   Mouth: Mucous membranes are dry.  ?   Pharynx: Oropharynx is clear.  ?Eyes:  ?   General: Scleral icterus present.  ?   Extraocular Movements: Extraocular movements intact.  ?   Conjunctiva/sclera: Conjunctivae normal.  ?   Pupils: Pupils are equal, round, and reactive to light.  ?Neck:  ?   Vascular: No carotid bruit.  ?Cardiovascular:  ?   Rate and Rhythm: Normal rate and regular rhythm.  ?   Pulses: Normal pulses.  ?   Heart sounds: Normal heart sounds.  ?Pulmonary:  ?   Effort: Pulmonary effort is normal.  ?   Breath sounds: Normal breath sounds. No wheezing.  ?Abdominal:  ?   General: Bowel sounds are normal. There is distension (Abdomen is mildly distended, with an increased liver span greater than 7 cm).  ?   Palpations: Abdomen is soft.  ?Musculoskeletal:  ?   Cervical back: Normal range of motion and neck supple.  ?Skin: ?   General: Skin is warm and dry.  ?   Capillary Refill: Capillary refill takes less than 2 seconds.  ?Neurological:  ?   General: No focal deficit present.  ?   Mental Status: He is alert and oriented to person, place, and time.  ?   Sensory: No sensory deficit.  ?   Motor: No weakness.  ?   Deep Tendon Reflexes: Reflexes normal.  ?Psychiatric:     ?   Attention and Perception: Attention and perception normal.     ?   Mood and Affect: Mood is anxious and depressed.     ?   Speech: Speech normal.     ?   Behavior: Behavior normal. Behavior is cooperative.     ?   Thought Content: Thought content normal.     ?    Judgment: Judgment normal.  ? ? ?Data Reviewed ?Labs ordered: CBC with differential, CMP, magnesium, phosphorus, anemia panel and Vitamin D level ? ?Assessment and Plan ?1. Health maintenance examination ?Besides mild jaundice, his physical exam is unremarkable.  Will repeat labs to rule out alcohol induced alcohol health complications.  ?- CBC w/Diff ?- Comp Met (CMET) ?- Urine Microalbumin w/creat. ratio ?- Urinalysis, Routine w reflex microscopic ?- Magnesium ?- Phosphorus ?- HgB A1c ?- Lipid   Profile ?- Vitamin B12 ?- Folate ?- Iron and TIBC ?- Ferritin ?- Vitamin D 1,25 dihydroxy ? ?2. Alcohol dependence in early full remission (HCC) ?He is currently in remission.  He is taking folic acid as well as a multivitamin.  Will add thiamine, vitamin B12 and obtain an anemia panel as well.  He is to continue the magnesium oxide that he is taking.  Refills provided. ? ?3. Elevated liver function tests ?Due to prolonged alcohol abuse.  He also has an increased liver span on exam.  For now we will repeat LFTs and consider abdominal ultrasound once patient is more stable and if he remains in remission. ? 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  Will start patient on vitamin B12 1000 mcg daily.  We will also obtain a B12 level.  Patient advised to perform light exercises twice a day. ?- Vitamin B12 ?- Folate ?- Iron and TIBC ?- Ferritin ? ?5. Cigarette nicotine dependence without complication ?He is a current everyday smoker.  He has a nicotine patch.  Refills provided for the nicotine patch.  Smoking cessation advice given. ? ?6. Depression, major, single episode, moderate (HCC) ?Patient's pH Q scale score is 16 consistent with severe depression.  He reports intermittent suicidal ideations but no plan.  No suicidal ideation reported over the last 2 weeks.  Patient has a good support system. ? ?7. Alcohol-induced insomnia (HCC) ?Will start on melatonin. Advised to take Celexa at bedtime with the  melatonin ? ? ?Magddalene S Tukov-Yual ?01/03/2022, 12:35 PM ? ? ? ?

## 2022-01-04 LAB — FOLATE: Folate: >20 ng/mL (ref >3.0)

## 2022-01-04 LAB — IRON AND TIBC
Iron Saturation: 28 % (ref 15–55)
Iron: 86 ug/dL (ref 38–169)
Total Iron Binding Capacity: 305 ug/dL (ref 250–450)
UIBC: 219 ug/dL (ref 111–343)

## 2022-01-04 LAB — FERRITIN: Ferritin: 363 ng/mL (ref 30–400)

## 2022-01-04 LAB — VITAMIN B12: Vitamin B-12: 1137 pg/mL (ref 232–1245)

## 2022-01-08 ENCOUNTER — Ambulatory Visit: Payer: Self-pay | Admitting: Pharmacy Technician

## 2022-01-08 DIAGNOSIS — Z79899 Other long term (current) drug therapy: Secondary | ICD-10-CM

## 2022-01-08 NOTE — Progress Notes (Signed)
Patient still needs to provide contract and patient advocate form. ? ?Travis Leach ?Care Manager ?[facility] ?

## 2022-01-10 ENCOUNTER — Other Ambulatory Visit: Payer: Self-pay

## 2022-01-15 ENCOUNTER — Telehealth: Payer: Self-pay | Admitting: Gerontology

## 2022-01-15 ENCOUNTER — Telehealth: Payer: Self-pay | Admitting: Licensed Clinical Social Worker

## 2022-01-15 NOTE — Telephone Encounter (Signed)
Called the patient and provided him with a referral to [facility] for mental health care.  ?

## 2022-01-18 ENCOUNTER — Other Ambulatory Visit: Payer: Self-pay

## 2022-01-21 LAB — URINALYSIS, ROUTINE W REFLEX MICROSCOPIC
Bilirubin, UA: NEGATIVE
Glucose, UA: NEGATIVE
Leukocytes,UA: NEGATIVE
Nitrite, UA: NEGATIVE
RBC, UA: NEGATIVE
Specific Gravity, UA: 1.02 (ref 1.005–1.030)
Urobilinogen, Ur: 0.2 mg/dL (ref 0.2–1.0)
pH, UA: 5 (ref 5.0–7.5)

## 2022-01-21 LAB — HEMOGLOBIN A1C
Est. average glucose Bld gHb Est-mCnc: 117 mg/dL
Hgb A1c MFr Bld: 5.7 % — ABNORMAL HIGH (ref 4.8–5.6)

## 2022-01-21 LAB — CBC WITH DIFFERENTIAL/PLATELET
Basophils Absolute: 0.1 10*3/uL (ref 0.0–0.2)
Basos: 1 %
EOS (ABSOLUTE): 0.2 10*3/uL (ref 0.0–0.4)
Eos: 2 %
Hematocrit: 41.5 % (ref 37.5–51.0)
Hemoglobin: 14.4 g/dL (ref 13.0–17.7)
Immature Grans (Abs): 0.1 10*3/uL (ref 0.0–0.1)
Immature Granulocytes: 1 %
Lymphocytes Absolute: 2.4 10*3/uL (ref 0.7–3.1)
Lymphs: 25 %
MCH: 29.4 pg (ref 26.6–33.0)
MCHC: 34.7 g/dL (ref 31.5–35.7)
MCV: 85 fL (ref 79–97)
Monocytes Absolute: 0.8 10*3/uL (ref 0.1–0.9)
Monocytes: 8 %
Neutrophils Absolute: 6.3 10*3/uL (ref 1.4–7.0)
Neutrophils: 63 %
Platelets: 301 10*3/uL (ref 150–450)
RBC: 4.9 x10E6/uL (ref 4.14–5.80)
RDW: 13 % (ref 11.6–15.4)
WBC: 9.8 10*3/uL (ref 3.4–10.8)

## 2022-01-21 LAB — LIPID PANEL
Chol/HDL Ratio: 6.4 ratio — ABNORMAL HIGH (ref 0.0–5.0)
Cholesterol, Total: 287 mg/dL — ABNORMAL HIGH (ref 100–199)
HDL: 45 mg/dL (ref >39)
LDL Chol Calc (NIH): 208 mg/dL — ABNORMAL HIGH (ref 0–99)
Triglycerides: 176 mg/dL — ABNORMAL HIGH (ref 0–149)
VLDL Cholesterol Cal: 34 mg/dL (ref 5–40)

## 2022-01-21 LAB — COMPREHENSIVE METABOLIC PANEL
ALT: 18 IU/L (ref 0–44)
AST: 25 IU/L (ref 0–40)
Albumin/Globulin Ratio: 1.8 (ref 1.2–2.2)
Albumin: 5.1 g/dL — ABNORMAL HIGH (ref 4.0–5.0)
Alkaline Phosphatase: 128 IU/L — ABNORMAL HIGH (ref 44–121)
BUN/Creatinine Ratio: 8 — ABNORMAL LOW (ref 9–20)
BUN: 7 mg/dL (ref 6–24)
Bilirubin Total: 0.2 mg/dL (ref 0.0–1.2)
Calcium: 10.4 mg/dL — ABNORMAL HIGH (ref 8.7–10.2)
Chloride: 100 mmol/L (ref 96–106)
Creatinine, Ser: 0.84 mg/dL (ref 0.76–1.27)
Globulin, Total: 2.9 g/dL (ref 1.5–4.5)
Glucose: 83 mg/dL (ref 70–99)
Potassium: 4.2 mmol/L (ref 3.5–5.2)
Sodium: 140 mmol/L (ref 134–144)
Total Protein: 8 g/dL (ref 6.0–8.5)
eGFR: 110 mL/min/{1.73_m2} (ref >59)

## 2022-01-21 LAB — MICROALBUMIN / CREATININE URINE RATIO
Creatinine, Urine: 273.8 mg/dL
Microalb/Creat Ratio: 20 mg/g creat (ref 0–29)
Microalbumin, Urine: 54 ug/mL

## 2022-01-21 LAB — PHOSPHORUS: Phosphorus: 5.4 mg/dL — ABNORMAL HIGH (ref 2.8–4.1)

## 2022-01-21 LAB — MAGNESIUM: Magnesium: 1.8 mg/dL (ref 1.6–2.3)

## 2022-01-30 ENCOUNTER — Ambulatory Visit: Payer: Self-pay | Admitting: Adult Health

## 2022-01-31 ENCOUNTER — Ambulatory Visit: Payer: Self-pay | Admitting: Gerontology

## 2022-01-31 ENCOUNTER — Other Ambulatory Visit: Payer: Self-pay

## 2022-01-31 ENCOUNTER — Ambulatory Visit: Payer: Self-pay

## 2022-01-31 ENCOUNTER — Ambulatory Visit: Payer: Self-pay | Admitting: Adult Health

## 2022-01-31 VITALS — BP 136/82 | HR 98 | Temp 98.4°F | Resp 16 | Ht 68.0 in | Wt 154.5 lb

## 2022-01-31 DIAGNOSIS — I10 Essential (primary) hypertension: Secondary | ICD-10-CM

## 2022-01-31 DIAGNOSIS — E785 Hyperlipidemia, unspecified: Secondary | ICD-10-CM

## 2022-01-31 DIAGNOSIS — F17210 Nicotine dependence, cigarettes, uncomplicated: Secondary | ICD-10-CM

## 2022-01-31 DIAGNOSIS — R7303 Prediabetes: Secondary | ICD-10-CM

## 2022-01-31 MED ORDER — PRAVASTATIN SODIUM 10 MG PO TABS
10.0000 mg | ORAL_TABLET | Freq: Every day | ORAL | 0 refills | Status: DC
  Filled 2022-01-31: qty 30, 30d supply, fill #0

## 2022-01-31 NOTE — Progress Notes (Signed)
? ?Established Patient Office Visit ? ?Subjective   ?Patient ID: Travis Leach, male    DOB: 11-22-1975  Age: 46 y.o. MRN: [id number] ? ?Chief Complaint  ?Patient presents with  ? Follow-up  ?  Med refill f/u   ? ? ?HPI ?Travis Leach is a 46 y.o. male who has history of hypertension, smoking, asthma, alcohol use, depression, presents for routine follow up visit, lab review and medication refill. His labs done on 01/03/22 were unremarkable except total cholesterol was 287 mg/dl, Triglycerides 176 mg/dl, LDL was 208 mg/dl and HgbA1c was 5.7%. He states that he's complaint with his medications, denies side effects and continues to make healthy lifestyle changes. He requests [place] Quit line information. Overall, he states that he's doing well and offers no further complaint. ? ? ?Review of Systems  ?Constitutional: Negative.   ?HENT: Negative.    ?Respiratory: Negative.    ?Cardiovascular: Negative.   ?Neurological: Negative.   ?Psychiatric/Behavioral: Negative.    ? ?  ?Objective:  ?  ? ?BP 136/82 (BP Location: Left Arm, Patient Position: Sitting, Cuff Size: Large)   Pulse 98   Temp 98.4 ?F (36.9 ?C) (Oral)   Resp 16   Ht _0  (1.727 m)   Wt 154 lb 8 oz (70.1 kg)   SpO2 96%   BMI 23.49 kg/m?  ?BP Readings from Last 3 Encounters:  ?01/31/22 136/82  ?01/03/22 120/87  ?12/16/21 (!) 154/108  ? ?Wt Readings from Last 3 Encounters:  ?01/31/22 154 lb 8 oz (70.1 kg)  ?01/03/22 152 lb 11.2 oz (69.3 kg)  ?12/13/21 144 lb 10 oz (65.6 kg)  ? ?  ? ?Physical Exam ?HENT:  ?   Head: Normocephalic and atraumatic.  ?   Mouth/Throat:  ?   Mouth: Mucous membranes are moist.  ?Eyes:  ?   Extraocular Movements: Extraocular movements intact.  ?   Conjunctiva/sclera: Conjunctivae normal.  ?   Pupils: Pupils are equal, round, and reactive to light.  ?Cardiovascular:  ?   Rate and Rhythm: Normal rate and regular rhythm.  ?   Pulses: Normal pulses.  ?   Heart sounds: Normal heart sounds.  ?Pulmonary:  ?   Effort: Pulmonary effort is normal.   ?   Breath sounds: Normal breath sounds.  ?Neurological:  ?   General: No focal deficit present.  ?   Mental Status: He is alert and oriented to person, place, and time. Mental status is at baseline.  ?Psychiatric:     ?   Mood and Affect: Mood normal.     ?   Behavior: Behavior normal.     ?   Thought Content: Thought content normal.     ?   Judgment: Judgment normal.  ? ? ? ?No results found for any visits on 01/31/22. ? ?Last CBC ?Lab Results  ?Component Value Date  ? WBC 9.8 01/03/2022  ? HGB 14.4 01/03/2022  ? HCT 41.5 01/03/2022  ? MCV 85 01/03/2022  ? MCH 29.4 01/03/2022  ? RDW 13.0 01/03/2022  ? PLT 301 01/03/2022  ? ?Last metabolic panel ?Lab Results  ?Component Value Date  ? GLUCOSE 83 01/03/2022  ? NA 140 01/03/2022  ? K 4.2 01/03/2022  ? CL 100 01/03/2022  ? CO2 CANCELED 01/03/2022  ? BUN 7 01/03/2022  ? CREATININE 0.84 01/03/2022  ? EGFR 110 01/03/2022  ? CALCIUM 10.4 (H) 01/03/2022  ? PHOS 5.4 (H) 01/03/2022  ? PROT 8.0 01/03/2022  ? ALBUMIN 5.1 (H) 01/03/2022  ?  LABGLOB 2.9 01/03/2022  ? AGRATIO 1.8 01/03/2022  ? BILITOT 0.2 01/03/2022  ? ALKPHOS 128 (H) 01/03/2022  ? AST 25 01/03/2022  ? ALT 18 01/03/2022  ? ANIONGAP 9 12/16/2021  ? ?Last lipids ?Lab Results  ?Component Value Date  ? CHOL 287 (H) 01/03/2022  ? HDL 45 01/03/2022  ? LDLCALC 208 (H) 01/03/2022  ? TRIG 176 (H) 01/03/2022  ? CHOLHDL 6.4 (H) 01/03/2022  ? ?Last hemoglobin A1c ?Lab Results  ?Component Value Date  ? HGBA1C 5.7 (H) 01/03/2022  ? ?Last vitamin D ?No results found for: 25OHVITD2, [place], VD25OH ?Last vitamin B12 and Folate ?Lab Results  ?Component Value Date  ? QWBEQUHK88 1,137 01/03/2022  ? FOLATE >20.0 01/03/2022  ? ?  ? ?The 10-year ASCVD risk score (Arnett DK, et al., 2019) is: 13.9% ? ?  ?Assessment & Plan:  ? ? ?1. Hypertension, unspecified type ?- His blood pressure is under control, he will continue on current medication, DASH diet and exercise as tolerated. ? ?2. Cigarette nicotine dependence without  complication ?- He was provided with  Quitline information and encouraged to call. ? ?3. Prediabetes ?- His HgbA1c was 5.7%, he states that he will modify his diet. He was encouraged to continue on low carb/non concentrated sweet diet and exercise as tolerated. ? ?4. Elevated lipids ?-The 10-year ASCVD risk score (Arnett DK, et al., 2019) is: 13.9% ?  Values used to calculate the score: ?    Age: 39 years ?    Sex: Male ?    Is Non-Hispanic African American: Yes ?    Diabetic: No ?    Tobacco smoker: Yes ?    Systolic Blood Pressure: 301 mmHg ?    Is BP treated: Yes ?    HDL Cholesterol: 45 mg/dL ?    Total Cholesterol: 287 mg/dL ?His ASCVD was 13.9%, he was started on 10 mg Pravastatin, educated on medication side effects and advised to notify clinic. He was encouraged to continue on low fat/cholesterol diet and exercise as tolerated. ?- pravastatin (PRAVACHOL) 10 MG tablet; Take 1 tablet (10 mg total) by mouth once daily.  Dispense: 30 tablet; Refill: 0 ? ? ?Return in about 4 weeks (around 02/28/2022), or if symptoms worsen or fail to improve.  ? ? ?Damaris Abeln Jerold Coombe, NP ? ?

## 2022-01-31 NOTE — Patient Instructions (Signed)
Heart-Healthy Eating Plan Many factors influence your heart (coronary) health, including eating and exercise habits. Coronary risk increases with abnormal blood fat (lipid) levels. Heart-healthy meal planning includes limiting unhealthy fats, increasing healthy fats, and making other diet and lifestyle changes. What is my plan? Your health care provider may recommend that you: Limit your fat intake to _________% or less of your total calories each day. Limit your saturated fat intake to _________% or less of your total calories each day. Limit the amount of cholesterol in your diet to less than _________ mg per day. What are tips for following this plan? Cooking Cook foods using methods other than frying. Baking, boiling, grilling, and broiling are all good options. Other ways to reduce fat include: Removing the skin from poultry. Removing all visible fats from meats. Steaming vegetables in water or broth. Meal planning  At meals, imagine dividing your plate into fourths: Fill one-half of your plate with vegetables and green salads. Fill one-fourth of your plate with whole grains. Fill one-fourth of your plate with lean protein foods. Eat 4-5 servings of vegetables per day. One serving equals 1 cup raw or cooked vegetable, or 2 cups raw leafy greens. Eat 4-5 servings of fruit per day. One serving equals 1 medium whole fruit,  cup dried fruit,  cup fresh, frozen, or canned fruit, or  cup 100% fruit juice. Eat more foods that contain soluble fiber. Examples include apples, broccoli, carrots, beans, peas, and barley. Aim to get 25-30 g of fiber per day. Increase your consumption of legumes, nuts, and seeds to 4-5 servings per week. One serving of dried beans or legumes equals  cup cooked, 1 serving of nuts is  cup, and 1 serving of seeds equals 1 tablespoon. Fats Choose healthy fats more often. Choose monounsaturated and polyunsaturated fats, such as olive and canola oils, flaxseeds,  walnuts, almonds, and seeds. Eat more omega-3 fats. Choose salmon, mackerel, sardines, tuna, flaxseed oil, and ground flaxseeds. Aim to eat fish at least 2 times each week. Check food labels carefully to identify foods with trans fats or high amounts of saturated fat. Limit saturated fats. These are found in animal products, such as meats, butter, and cream. Plant sources of saturated fats include palm oil, palm kernel oil, and coconut oil. Avoid foods with partially hydrogenated oils in them. These contain trans fats. Examples are stick margarine, some tub margarines, cookies, crackers, and other baked goods. Avoid fried foods. General information Eat more home-cooked food and less restaurant, buffet, and fast food. Limit or avoid alcohol. Limit foods that are high in starch and sugar. Lose weight if you are overweight. Losing just 5-10% of your body weight can help your overall health and prevent diseases such as diabetes and heart disease. Monitor your salt (sodium) intake, especially if you have high blood pressure. Talk with your health care provider about your sodium intake. Try to incorporate more vegetarian meals weekly. What foods can I eat? Fruits All fresh, canned (in natural juice), or frozen fruits. Vegetables Fresh or frozen vegetables (raw, steamed, roasted, or grilled). Green salads. Grains Most grains. Choose whole wheat and whole grains most of the time. Rice and pasta, including brown rice and pastas made with whole wheat. Meats and other proteins Lean, well-trimmed beef, veal, pork, and lamb. Chicken and turkey without skin. All fish and shellfish. Wild duck, rabbit, pheasant, and venison. Egg whites or low-cholesterol egg substitutes. Dried beans, peas, lentils, and tofu. Seeds and most nuts. Dairy Low-fat or nonfat cheeses,   including ricotta and mozzarella. Skim or 1% milk (liquid, powdered, or evaporated). Buttermilk made with low-fat milk. Nonfat or low-fat  yogurt. ?Fats and oils ?Non-hydrogenated (trans-free) margarines. Vegetable oils, including soybean, sesame, sunflower, olive, peanut, safflower, corn, canola, and cottonseed. Salad dressings or mayonnaise made with a vegetable oil. ?Beverages ?Water (mineral or sparkling). Coffee and tea. Diet carbonated beverages. ?Sweets and desserts ?Sherbet, gelatin, and fruit ice. Small amounts of dark chocolate. ?Limit all sweets and desserts. ?Seasonings and condiments ?All seasonings and condiments. ?The items listed above may not be a complete list of foods and beverages you can eat. Contact a dietitian for more options. ?What foods are not recommended? ?Fruits ?Canned fruit in heavy syrup. Fruit in cream or butter sauce. Fried fruit. Limit coconut. ?Vegetables ?Vegetables cooked in cheese, cream, or butter sauce. Fried vegetables. ?Grains ?Breads made with saturated or trans fats, oils, or whole milk. Croissants. Sweet rolls. Donuts. High-fat crackers, such as cheese crackers. ?Meats and other proteins ?Fatty meats, such as hot dogs, ribs, sausage, bacon, rib-eye roast or steak. High-fat deli meats, such as salami and bologna. Caviar. Domestic duck and goose. Organ meats, such as liver. ?Dairy ?Cream, sour cream, cream cheese, and creamed cottage cheese. Whole-milk cheeses. Whole or 2% milk (liquid, evaporated, or condensed). Whole buttermilk. Cream sauce or high-fat cheese sauce. Whole-milk yogurt. ?Fats and oils ?Meat fat, or shortening. Cocoa butter, hydrogenated oils, palm oil, coconut oil, palm kernel oil. Solid fats and shortenings, including bacon fat, salt pork, lard, and butter. Nondairy cream substitutes. Salad dressings with cheese or sour cream. ?Beverages ?Regular sodas and any drinks with added sugar. ?Sweets and desserts ?Frosting. Pudding. Cookies. Cakes. Pies. Milk chocolate or white chocolate. Buttered syrups. Full-fat ice cream or ice cream drinks. ?The items listed above may not be a complete list of  foods and beverages to avoid. Contact a dietitian for more information. ?Summary ?Heart-healthy meal planning includes limiting unhealthy fats, increasing healthy fats, and making other diet and lifestyle changes. ?Lose weight if you are overweight. Losing just 5-10% of your body weight can help your overall health and prevent diseases such as diabetes and heart disease. ?Focus on eating a balance of foods, including fruits and vegetables, low-fat or nonfat dairy, lean protein, nuts and legumes, whole grains, and heart-healthy oils and fats. ?This information is not intended to replace advice given to you by your health care provider. Make sure you discuss any questions you have with your health care provider. ?Document Revised: 01/25/2021 Document Reviewed: 01/25/2021 ?Elsevier Patient Education ? 2022 [employer] ?DASH Eating Plan ?DASH stands for Dietary Approaches to Stop Hypertension. The DASH eating plan is a healthy eating plan that has been shown to: ?Reduce high blood pressure (hypertension). ?Reduce your risk for type 2 diabetes, heart disease, and stroke. ?Help with weight loss. ?What are tips for following this plan? ?Reading food labels ?Check food labels for the amount of salt (sodium) per serving. Choose foods with less than 5 percent of the Daily Value of sodium. Generally, foods with less than 300 milligrams (mg) of sodium per serving fit into this eating plan. ?To find whole grains, look for the word "whole" as the first word in the ingredient list. ?Shopping ?Buy products labeled as "low-sodium" or "no salt added." ?Buy fresh foods. Avoid canned foods and pre-made or frozen meals. ?Cooking ?Avoid adding salt when cooking. Use salt-free seasonings or herbs instead of table salt or sea salt. Check with your health care provider or pharmacist before using salt substitutes. ?  Do not fry foods. Cook foods using healthy methods such as baking, boiling, grilling, roasting, and broiling instead. ?Cook  with heart-healthy oils, such as olive, canola, avocado, soybean, or sunflower oil. ?Meal planning ? ?Eat a balanced diet that includes: ?4 or more servings of fruits and 4 or more servings of vegetables each day. Try to

## 2022-02-01 ENCOUNTER — Other Ambulatory Visit: Payer: Self-pay

## 2022-02-05 ENCOUNTER — Other Ambulatory Visit: Payer: Self-pay

## 2022-02-28 ENCOUNTER — Ambulatory Visit: Payer: Self-pay

## 2022-03-07 ENCOUNTER — Ambulatory Visit: Payer: Self-pay | Admitting: Gerontology

## 2022-03-07 ENCOUNTER — Other Ambulatory Visit: Payer: Self-pay

## 2022-03-07 VITALS — BP 133/78 | HR 82 | Temp 98.0°F | Resp 16 | Ht 69.0 in | Wt 158.0 lb

## 2022-03-07 DIAGNOSIS — E785 Hyperlipidemia, unspecified: Secondary | ICD-10-CM

## 2022-03-07 DIAGNOSIS — I10 Essential (primary) hypertension: Secondary | ICD-10-CM

## 2022-03-07 MED ORDER — PRAVASTATIN SODIUM 10 MG PO TABS
10.0000 mg | ORAL_TABLET | Freq: Every day | ORAL | 2 refills | Status: DC
  Filled 2022-03-07: qty 30, 30d supply, fill #0
  Filled 2022-04-08: qty 30, 30d supply, fill #1
  Filled 2022-05-20: qty 30, 30d supply, fill #2

## 2022-03-07 NOTE — Patient Instructions (Signed)
DASH Eating Plan DASH stands for Dietary Approaches to Stop Hypertension. The DASH eating plan is a healthy eating plan that has been shown to: Reduce high blood pressure (hypertension). Reduce your risk for type 2 diabetes, heart disease, and stroke. Help with weight loss. What are tips for following this plan? Reading food labels Check food labels for the amount of salt (sodium) per serving. Choose foods with less than 5 percent of the Daily Value of sodium. Generally, foods with less than 300 milligrams (mg) of sodium per serving fit into this eating plan. To find whole grains, look for the word "whole" as the first word in the ingredient list. Shopping Buy products labeled as "low-sodium" or "no salt added." Buy fresh foods. Avoid canned foods and pre-made or frozen meals. Cooking Avoid adding salt when cooking. Use salt-free seasonings or herbs instead of table salt or sea salt. Check with your health care provider or pharmacist before using salt substitutes. Do not fry foods. Cook foods using healthy methods such as baking, boiling, grilling, roasting, and broiling instead. Cook with heart-healthy oils, such as olive, canola, avocado, soybean, or sunflower oil. Meal planning  Eat a balanced diet that includes: 4 or more servings of fruits and 4 or more servings of vegetables each day. Try to fill one-half of your plate with fruits and vegetables. 6-8 servings of whole grains each day. Less than 6 oz (170 g) of lean meat, poultry, or fish each day. A 3-oz (85-g) serving of meat is about the same size as a deck of cards. One egg equals 1 oz (28 g). 2-3 servings of low-fat dairy each day. One serving is 1 cup (237 mL). 1 serving of nuts, seeds, or beans 5 times each week. 2-3 servings of heart-healthy fats. Healthy fats called omega-3 fatty acids are found in foods such as walnuts, flaxseeds, fortified milks, and eggs. These fats are also found in cold-water fish, such as sardines, salmon,  and mackerel. Limit how much you eat of: Canned or prepackaged foods. Food that is high in trans fat, such as some fried foods. Food that is high in saturated fat, such as fatty meat. Desserts and other sweets, sugary drinks, and other foods with added sugar. Full-fat dairy products. Do not salt foods before eating. Do not eat more than 4 egg yolks a week. Try to eat at least 2 vegetarian meals a week. Eat more home-cooked food and less restaurant, buffet, and fast food. Lifestyle When eating at a restaurant, ask that your food be prepared with less salt or no salt, if possible. If you drink alcohol: Limit how much you use to: 0-1 drink a day for women who are not pregnant. 0-2 drinks a day for men. Be aware of how much alcohol is in your drink. In the [place] one drink equals one 12 oz bottle of beer (355 mL), one 5 oz glass of wine (148 mL), or one 1 oz glass of hard liquor (44 mL). General information Avoid eating more than 2,300 mg of salt a day. If you have hypertension, you may need to reduce your sodium intake to 1,500 mg a day. Work with your health care provider to maintain a healthy body weight or to lose weight. Ask what an ideal weight is for you. Get at least 30 minutes of exercise that causes your heart to beat faster (aerobic exercise) most days of the week. Activities may include walking, swimming, or biking. Work with your health care provider or dietitian to   adjust your eating plan to your individual calorie needs. What foods should I eat? Fruits All fresh, dried, or frozen fruit. Canned fruit in natural juice (without added sugar). Vegetables Fresh or frozen vegetables (raw, steamed, roasted, or grilled). Low-sodium or reduced-sodium tomato and vegetable juice. Low-sodium or reduced-sodium tomato sauce and tomato paste. Low-sodium or reduced-sodium canned vegetables. Grains Whole-grain or whole-wheat bread. Whole-grain or whole-wheat pasta. Brown rice. Oatmeal. Quinoa.  Bulgur. Whole-grain and low-sodium cereals. Pita bread. Low-fat, low-sodium crackers. Whole-wheat flour tortillas. Meats and other proteins Skinless chicken or turkey. Ground chicken or turkey. Pork with fat trimmed off. Fish and seafood. Egg whites. Dried beans, peas, or lentils. Unsalted nuts, nut butters, and seeds. Unsalted canned beans. Lean cuts of beef with fat trimmed off. Low-sodium, lean precooked or cured meat, such as sausages or meat loaves. Dairy Low-fat (1%) or fat-free (skim) milk. Reduced-fat, low-fat, or fat-free cheeses. Nonfat, low-sodium ricotta or cottage cheese. Low-fat or nonfat yogurt. Low-fat, low-sodium cheese. Fats and oils Soft margarine without trans fats. Vegetable oil. Reduced-fat, low-fat, or light mayonnaise and salad dressings (reduced-sodium). Canola, safflower, olive, avocado, soybean, and sunflower oils. Avocado. Seasonings and condiments Herbs. Spices. Seasoning mixes without salt. Other foods Unsalted popcorn and pretzels. Fat-free sweets. The items listed above may not be a complete list of foods and beverages you can eat. Contact a dietitian for more information. What foods should I avoid? Fruits Canned fruit in a light or heavy syrup. Fried fruit. Fruit in cream or butter sauce. Vegetables Creamed or fried vegetables. Vegetables in a cheese sauce. Regular canned vegetables (not low-sodium or reduced-sodium). Regular canned tomato sauce and paste (not low-sodium or reduced-sodium). Regular tomato and vegetable juice (not low-sodium or reduced-sodium). Pickles. Olives. Grains Baked goods made with fat, such as croissants, muffins, or some breads. Dry pasta or rice meal packs. Meats and other proteins Fatty cuts of meat. Ribs. Fried meat. Bacon. Bologna, salami, and other precooked or cured meats, such as sausages or meat loaves. Fat from the back of a pig (fatback). Bratwurst. Salted nuts and seeds. Canned beans with added salt. Canned or smoked fish.  Whole eggs or egg yolks. Chicken or turkey with skin. Dairy Whole or 2% milk, cream, and half-and-half. Whole or full-fat cream cheese. Whole-fat or sweetened yogurt. Full-fat cheese. Nondairy creamers. Whipped toppings. Processed cheese and cheese spreads. Fats and oils Butter. Stick margarine. Lard. Shortening. Ghee. Bacon fat. Tropical oils, such as coconut, palm kernel, or palm oil. Seasonings and condiments Onion salt, garlic salt, seasoned salt, table salt, and sea salt. Worcestershire sauce. Tartar sauce. Barbecue sauce. Teriyaki sauce. Soy sauce, including reduced-sodium. Steak sauce. Canned and packaged gravies. Fish sauce. Oyster sauce. Cocktail sauce. Store-bought horseradish. Ketchup. Mustard. Meat flavorings and tenderizers. Bouillon cubes. Hot sauces. Pre-made or packaged marinades. Pre-made or packaged taco seasonings. Relishes. Regular salad dressings. Other foods Salted popcorn and pretzels. The items listed above may not be a complete list of foods and beverages you should avoid. Contact a dietitian for more information. Where to find more information National Heart, Lung, and Blood Institute: www.nhlbi.nih.gov American Heart Association: www.heart.org Academy of Nutrition and Dietetics: www.eatright.org National Kidney Foundation: www.kidney.org Summary The DASH eating plan is a healthy eating plan that has been shown to reduce high blood pressure (hypertension). It may also reduce your risk for type 2 diabetes, heart disease, and stroke. When on the DASH eating plan, aim to eat more fresh fruits and vegetables, whole grains, lean proteins, low-fat dairy, and heart-healthy fats. With the DASH   eating plan, you should limit salt (sodium) intake to 2,300 mg a day. If you have hypertension, you may need to reduce your sodium intake to 1,500 mg a day. Work with your health care provider or dietitian to adjust your eating plan to your individual calorie needs. This information is not  intended to replace advice given to you by your health care provider. Make sure you discuss any questions you have with your health care provider. Document Revised: 08/20/2019 Document Reviewed: 08/20/2019 Elsevier Patient Education  2023 [employer] Inc. Heart-Healthy Eating Plan Heart-healthy meal planning includes: Eating less unhealthy fats. Eating more healthy fats. Making other changes in your diet. Talk with your doctor or a diet specialist (dietitian) to create an eating plan that is right for you. What is my plan? Your doctor may recommend an eating plan that includes: Total fat: ______% or less of total calories a day. Saturated fat: ______% or less of total calories a day. Cholesterol: less than _________mg a day. What are tips for following this plan? Cooking Avoid frying your food. Try to bake, boil, grill, or broil it instead. You can also reduce fat by: Removing the skin from poultry. Removing all visible fats from meats. Steaming vegetables in water or broth. Meal planning  At meals, divide your plate into four equal parts: Fill one-half of your plate with vegetables and green salads. Fill one-fourth of your plate with whole grains. Fill one-fourth of your plate with lean protein foods. Eat 4-5 servings of vegetables per day. A serving of vegetables is: 1 cup of raw or cooked vegetables. 2 cups of raw leafy greens. Eat 4-5 servings of fruit per day. A serving of fruit is: 1 medium whole fruit.  cup of dried fruit.  cup of fresh, frozen, or canned fruit.  cup of 100% fruit juice. Eat more foods that have soluble fiber. These are apples, broccoli, carrots, beans, peas, and barley. Try to get 20-30 g of fiber per day. Eat 4-5 servings of nuts, legumes, and seeds per week: 1 serving of dried beans or legumes equals  cup after being cooked. 1 serving of nuts is  cup. 1 serving of seeds equals 1 tablespoon. General information Eat more home-cooked food. Eat less  restaurant, buffet, and fast food. Limit or avoid alcohol. Limit foods that are high in starch and sugar. Avoid fried foods. Lose weight if you are overweight. Keep track of how much salt (sodium) you eat. This is important if you have high blood pressure. Ask your doctor to tell you more about this. Try to add vegetarian meals each week. Fats Choose healthy fats. These include olive oil and canola oil, flaxseeds, walnuts, almonds, and seeds. Eat more omega-3 fats. These include salmon, mackerel, sardines, tuna, flaxseed oil, and ground flaxseeds. Try to eat fish at least 2 times each week. Check food labels. Avoid foods with trans fats or high amounts of saturated fat. Limit saturated fats. These are often found in animal products, such as meats, butter, and cream. These are also found in plant foods, such as palm oil, palm kernel oil, and coconut oil. Avoid foods with partially hydrogenated oils in them. These have trans fats. Examples are stick margarine, some tub margarines, cookies, crackers, and other baked goods. What foods can I eat? Fruits All fresh, canned (in natural juice), or frozen fruits. Vegetables Fresh or frozen vegetables (raw, steamed, roasted, or grilled). Green salads. Grains Most grains. Choose whole wheat and whole grains most of the time. Rice and  pasta, including brown rice and pastas made with whole wheat. Meats and other proteins Lean, well-trimmed beef, veal, pork, and lamb. Chicken and [place] without skin. All fish and shellfish. Wild duck, rabbit, pheasant, and venison. Egg whites or low-cholesterol egg substitutes. Dried beans, peas, lentils, and tofu. Seeds and most nuts. Dairy Low-fat or nonfat cheeses, including ricotta and mozzarella. Skim or 1% milk that is liquid, powdered, or evaporated. Buttermilk that is made with low-fat milk. Nonfat or low-fat yogurt. Fats and oils Non-hydrogenated (trans-free) margarines. Vegetable oils, including soybean,  sesame, sunflower, olive, peanut, safflower, corn, canola, and cottonseed. Salad dressings or mayonnaise made with a vegetable oil. Beverages Mineral water. Coffee and tea. Diet carbonated beverages. Sweets and desserts Sherbet, gelatin, and fruit ice. Small amounts of dark chocolate. Limit all sweets and desserts. Seasonings and condiments All seasonings and condiments. The items listed above may not be a complete list of foods and drinks you can eat. Contact a dietitian for more options. What foods should I avoid? Fruits Canned fruit in heavy syrup. Fruit in cream or butter sauce. Fried fruit. Limit coconut. Vegetables Vegetables cooked in cheese, cream, or butter sauce. Fried vegetables. Grains Breads that are made with saturated or trans fats, oils, or whole milk. Croissants. Sweet rolls. Donuts. High-fat crackers, such as cheese crackers. Meats and other proteins Fatty meats, such as hot dogs, ribs, sausage, bacon, rib-eye roast or steak. High-fat deli meats, such as salami and bologna. Caviar. Domestic duck and goose. Organ meats, such as liver. Dairy Cream, sour cream, cream cheese, and creamed cottage cheese. Whole-milk cheeses. Whole or 2% milk that is liquid, evaporated, or condensed. Whole buttermilk. Cream sauce or high-fat cheese sauce. Yogurt that is made from whole milk. Fats and oils Meat fat, or shortening. Cocoa butter, hydrogenated oils, palm oil, coconut oil, palm kernel oil. Solid fats and shortenings, including bacon fat, salt pork, lard, and butter. Nondairy cream substitutes. Salad dressings with cheese or sour cream. Beverages Regular sodas and juice drinks with added sugar. Sweets and desserts Frosting. Pudding. Cookies. Cakes. Pies. Milk chocolate or white chocolate. Buttered syrups. Full-fat ice cream or ice cream drinks. The items listed above may not be a complete list of foods and drinks to avoid. Contact a dietitian for more  information. Summary Heart-healthy meal planning includes eating less unhealthy fats, eating more healthy fats, and making other changes in your diet. Eat a balanced diet. This includes fruits and vegetables, low-fat or nonfat dairy, lean protein, nuts and legumes, whole grains, and heart-healthy oils and fats. This information is not intended to replace advice given to you by your health care provider. Make sure you discuss any questions you have with your health care provider. Document Revised: 01/25/2021 Document Reviewed: 01/25/2021 Elsevier Patient Education  2022 [employer].

## 2022-03-07 NOTE — Progress Notes (Signed)
Established Patient Office Visit  Subjective   Patient ID: Travis Leach, male    DOB: 02-Oct-1975  Age: 46 y.o. MRN: [id number]  Chief Complaint  Patient presents with   Follow-up    Renew prescriptions for meds    HPI  Travis Leach is a 46 y.o. male who has history of hypertension, smoking, asthma, alcohol use, depression, presents for follow up visit and medication refill. He states that he's compliant with his medications, denies side effects and continues to make healthy lifestyle changes.  Overall, he states that he is doing well and offers no further complaint.  Review of Systems  Constitutional: Negative.   Respiratory: Negative.    Cardiovascular: Negative.   Musculoskeletal: Negative.   Neurological: Negative.   Psychiatric/Behavioral: Negative.        Objective:     BP 133/78 (BP Location: Left Arm, Patient Position: Sitting, Cuff Size: Small)   Pulse 82   Temp 98 F (36.7 C) (Oral)   Resp 16   Ht '5\' 9"'  (1.753 m)   Wt 158 lb (71.7 kg)   SpO2 98%   BMI 23.33 kg/m  BP Readings from Last 3 Encounters:  03/07/22 133/78  01/31/22 136/82  01/03/22 120/87   Wt Readings from Last 3 Encounters:  03/07/22 158 lb (71.7 kg)  01/31/22 154 lb 8 oz (70.1 kg)  01/03/22 152 lb 11.2 oz (69.3 kg)      Physical Exam HENT:     Head: Normocephalic and atraumatic.     Mouth/Throat:     Mouth: Mucous membranes are moist.  Eyes:     Extraocular Movements: Extraocular movements intact.     Conjunctiva/sclera: Conjunctivae normal.     Pupils: Pupils are equal, round, and reactive to light.  Cardiovascular:     Rate and Rhythm: Normal rate and regular rhythm.     Pulses: Normal pulses.     Heart sounds: Normal heart sounds.  Pulmonary:     Effort: Pulmonary effort is normal.     Breath sounds: Normal breath sounds.  Neurological:     General: No focal deficit present.     Mental Status: He is alert and oriented to person, place, and time. Mental status is at  baseline.  Psychiatric:        Mood and Affect: Mood normal.        Behavior: Behavior normal.        Thought Content: Thought content normal.        Judgment: Judgment normal.      No results found for any visits on 03/07/22.  Last CBC Lab Results  Component Value Date   WBC 9.8 01/03/2022   HGB 14.4 01/03/2022   HCT 41.5 01/03/2022   MCV 85 01/03/2022   MCH 29.4 01/03/2022   RDW 13.0 01/03/2022   PLT 301 71/02/2693   Last metabolic panel Lab Results  Component Value Date   GLUCOSE 83 01/03/2022   NA 140 01/03/2022   K 4.2 01/03/2022   CL 100 01/03/2022   CO2 CANCELED 01/03/2022   BUN 7 01/03/2022   CREATININE 0.84 01/03/2022   EGFR 110 01/03/2022   CALCIUM 10.4 (H) 01/03/2022   PHOS 5.4 (H) 01/03/2022   PROT 8.0 01/03/2022   ALBUMIN 5.1 (H) 01/03/2022   LABGLOB 2.9 01/03/2022   AGRATIO 1.8 01/03/2022   BILITOT 0.2 01/03/2022   ALKPHOS 128 (H) 01/03/2022   AST 25 01/03/2022   ALT 18 01/03/2022   ANIONGAP 9 12/16/2021  Last lipids Lab Results  Component Value Date   CHOL 287 (H) 01/03/2022   HDL 45 01/03/2022   LDLCALC 208 (H) 01/03/2022   TRIG 176 (H) 01/03/2022   CHOLHDL 6.4 (H) 01/03/2022   Last hemoglobin A1c Lab Results  Component Value Date   HGBA1C 5.7 (H) 01/03/2022   Last thyroid functions No results found for: "TSH", "T3TOTAL", "T4TOTAL", "THYROIDAB"    The 10-year ASCVD risk score (Arnett DK, et al., 2019) is: 13.4%    Assessment & Plan:   1. Elevated lipids -His ASCVD risk factor was 13.4%, he will continue on current medication, low-fat/cholesterol diet and exercise as tolerated.  We will recheck lipid panel in 3 months. - pravastatin (PRAVACHOL) 10 MG tablet; Take 1 tablet (10 mg total) by mouth once daily.  Dispense: 30 tablet; Refill: 2 - Lipid panel; Future  2. Hypertension, unspecified type -His blood pressure is under control, and he will continue on current medication, DASH diet and exercise as tolerated.    Return in  about 10 weeks (around 05/16/2022), or if symptoms worsen or fail to improve.    Tameko Halder Jerold Coombe, NP

## 2022-03-08 ENCOUNTER — Other Ambulatory Visit: Payer: Self-pay

## 2022-04-08 ENCOUNTER — Other Ambulatory Visit: Payer: Self-pay

## 2022-04-08 ENCOUNTER — Other Ambulatory Visit: Payer: Self-pay | Admitting: Gerontology

## 2022-04-09 ENCOUNTER — Other Ambulatory Visit: Payer: Self-pay

## 2022-04-09 MED ORDER — CITALOPRAM HYDROBROMIDE 10 MG PO TABS
10.0000 mg | ORAL_TABLET | Freq: Every day | ORAL | 0 refills | Status: DC
  Filled 2022-04-09: qty 30, 30d supply, fill #0

## 2022-04-24 ENCOUNTER — Emergency Department
Admission: EM | Admit: 2022-04-24 | Discharge: 2022-04-24 | Disposition: A | Discharge status: Home / Self Care | Payer: Self-pay | Attending: Emergency Medicine | Admitting: Emergency Medicine

## 2022-04-24 ENCOUNTER — Emergency Department: Payer: Self-pay

## 2022-04-24 ENCOUNTER — Encounter: Payer: Self-pay | Admitting: Emergency Medicine

## 2022-04-24 ENCOUNTER — Other Ambulatory Visit: Payer: Self-pay

## 2022-04-24 DIAGNOSIS — R0789 Other chest pain: Secondary | ICD-10-CM | POA: Insufficient documentation

## 2022-04-24 DIAGNOSIS — I10 Essential (primary) hypertension: Secondary | ICD-10-CM | POA: Insufficient documentation

## 2022-04-24 DIAGNOSIS — D72829 Elevated white blood cell count, unspecified: Secondary | ICD-10-CM | POA: Insufficient documentation

## 2022-04-24 DIAGNOSIS — R1010 Upper abdominal pain, unspecified: Secondary | ICD-10-CM

## 2022-04-24 DIAGNOSIS — R1012 Left upper quadrant pain: Secondary | ICD-10-CM | POA: Insufficient documentation

## 2022-04-24 DIAGNOSIS — R1011 Right upper quadrant pain: Secondary | ICD-10-CM | POA: Insufficient documentation

## 2022-04-24 LAB — URINALYSIS, ROUTINE W REFLEX MICROSCOPIC
Bilirubin Urine: NEGATIVE
Glucose, UA: NEGATIVE mg/dL
Hgb urine dipstick: NEGATIVE
Ketones, ur: 20 mg/dL — AB
Leukocytes,Ua: NEGATIVE
Nitrite: NEGATIVE
Protein, ur: NEGATIVE mg/dL
Specific Gravity, Urine: 1.018 (ref 1.005–1.030)
pH: 5 (ref 5.0–8.0)

## 2022-04-24 LAB — COMPREHENSIVE METABOLIC PANEL
ALT: 20 U/L (ref 0–44)
AST: 32 U/L (ref 15–41)
Albumin: 4.8 g/dL (ref 3.5–5.0)
Alkaline Phosphatase: 86 U/L (ref 38–126)
Anion gap: 13 (ref 5–15)
BUN: 9 mg/dL (ref 6–20)
CO2: 20 mmol/L — ABNORMAL LOW (ref 22–32)
Calcium: 9.6 mg/dL (ref 8.9–10.3)
Chloride: 109 mmol/L (ref 98–111)
Creatinine, Ser: 0.82 mg/dL (ref 0.61–1.24)
GFR, Estimated: >60 mL/min (ref >60)
Glucose, Bld: 78 mg/dL (ref 70–99)
Potassium: 4.2 mmol/L (ref 3.5–5.1)
Sodium: 142 mmol/L (ref 135–145)
Total Bilirubin: 0.5 mg/dL (ref 0.3–1.2)
Total Protein: 8.2 g/dL — ABNORMAL HIGH (ref 6.5–8.1)

## 2022-04-24 LAB — CBC WITH DIFFERENTIAL/PLATELET
Abs Immature Granulocytes: 0.06 10*3/uL (ref 0.00–0.07)
Basophils Absolute: 0.1 10*3/uL (ref 0.0–0.1)
Basophils Relative: 1 %
Eosinophils Absolute: 0.1 10*3/uL (ref 0.0–0.5)
Eosinophils Relative: 1 %
HCT: 42.7 % (ref 39.0–52.0)
Hemoglobin: 13.7 g/dL (ref 13.0–17.0)
Immature Granulocytes: 1 %
Lymphocytes Relative: 16 %
Lymphs Abs: 1.9 10*3/uL (ref 0.7–4.0)
MCH: 27.4 pg (ref 26.0–34.0)
MCHC: 32.1 g/dL (ref 30.0–36.0)
MCV: 85.4 fL (ref 80.0–100.0)
Monocytes Absolute: 0.8 10*3/uL (ref 0.1–1.0)
Monocytes Relative: 7 %
Neutro Abs: 8.8 10*3/uL — ABNORMAL HIGH (ref 1.7–7.7)
Neutrophils Relative %: 74 %
Platelets: 258 10*3/uL (ref 150–400)
RBC: 5 MIL/uL (ref 4.22–5.81)
RDW: 13.5 % (ref 11.5–15.5)
WBC: 11.6 10*3/uL — ABNORMAL HIGH (ref 4.0–10.5)
nRBC: 0 % (ref 0.0–0.2)

## 2022-04-24 LAB — LIPASE, BLOOD: Lipase: 37 U/L (ref 11–51)

## 2022-04-24 LAB — TROPONIN I (HIGH SENSITIVITY): Troponin I (High Sensitivity): 5 ng/L (ref <18)

## 2022-04-24 MED ORDER — OXYCODONE-ACETAMINOPHEN 7.5-325 MG PO TABS: 1.0000 | ORAL_TABLET | Freq: Four times a day (QID) | ORAL | 0 refills | Status: AC | PRN

## 2022-04-24 MED ORDER — ONDANSETRON 4 MG PO TBDP: 4.0000 mg | ORAL_TABLET | Freq: Three times a day (TID) | ORAL | 0 refills | Status: DC | PRN

## 2022-04-24 MED ORDER — IOHEXOL 300 MG/ML  SOLN
100.0000 mL | Freq: Once | INTRAMUSCULAR | Status: AC | PRN
  Administered 2022-04-24: 100 mL via INTRAVENOUS

## 2022-04-24 MED ORDER — OXYCODONE-ACETAMINOPHEN 5-325 MG PO TABS: 1.0000 | ORAL_TABLET | Freq: Four times a day (QID) | ORAL | 0 refills | Status: AC | PRN

## 2022-04-24 MED ORDER — MORPHINE SULFATE (PF) 4 MG/ML IV SOLN
4.0000 mg | Freq: Once | INTRAVENOUS | Status: AC
  Administered 2022-04-24: 4 mg via INTRAVENOUS
  Filled 2022-04-24: qty 1

## 2022-04-24 MED ORDER — ONDANSETRON HCL 4 MG/2ML IJ SOLN
4.0000 mg | Freq: Once | INTRAMUSCULAR | Status: AC
Start: 2022-04-24 — End: 2022-04-24
  Administered 2022-04-24: 4 mg via INTRAVENOUS
  Filled 2022-04-24: qty 2

## 2022-04-24 MED ORDER — ONDANSETRON 4 MG PO TBDP: 4.0000 mg | ORAL_TABLET | Freq: Three times a day (TID) | ORAL | 0 refills | Status: AC | PRN

## 2022-04-24 NOTE — Discharge Instructions (Signed)
You can take Percocet for pain. Please pare Percocet with Zofran in order to avoid nausea.

## 2022-04-24 NOTE — ED Triage Notes (Signed)
Pt to ED via [facility] from home c/o rib injury that occurred about an hour ago. Pt states he was working and tried lifting 10-15 sheets of plywood by himself, pt attempted to set plywood down causing him to get pinned in between wall. Pt has bilateral lower rib pain and upper abdominal pain. Denies CP, and SOB  Denies hitting head, denies use of blood thinners. Denies pain in extremities.   Pt is A&Ox4.

## 2022-04-24 NOTE — ED Provider Notes (Signed)
[facility] Provider Note  Patient Contact: 5:30 PM (approximate)   History   Rib Injury   HPI  Travis Leach is a 46 y.o. male with a history of cocaine abuse, hypertension and pancytopenia, presents to the emergency department after patient was attempting to lift multiple sheets of plywood at the same time.  Patient states that he tipped backwards with plywood on top of him and pinned between the plywood and a wall.  Patient states that he does not think he hit his head but is having anterior chest pain and upper abdominal pain.  He denies associated shortness of breath.  No numbness or tingling in the upper and lower extremities.      Physical Exam   Triage Vital Signs: ED Triage Vitals [04/24/22 1728]  Enc Vitals Group     BP (!) 152/90     Pulse Rate 88     Resp (!) 22     Temp 98.5 F (36.9 C)     Temp Source Oral     SpO2 96 %     Weight 170 lb (77.1 kg)     Height 5\' 10"  (1.778 m)     Head Circumference      Peak Flow      Pain Score 8     Pain Loc      Pain Edu?      Excl. in GC?     Most recent vital signs: Vitals:   04/24/22 1728 04/24/22 1942  BP: (!) 152/90 (!) 155/101  Pulse: 88 93  Resp: (!) 22 18  Temp: 98.5 F (36.9 C) 98.5 F (36.9 C)  SpO2: 96% 98%     General: Alert and in no acute distress. Eyes:  PERRL. EOMI. Head: No acute traumatic findings ENT:      Nose: No congestion/rhinnorhea.      Mouth/Throat: Mucous membranes are moist. Neck: No stridor. No cervical spine tenderness to palpation. Cardiovascular:  Good peripheral perfusion Respiratory: Normal respiratory effort without tachypnea or retractions. Lungs CTAB. Good air entry to the bases with no decreased or absent breath sounds. Gastrointestinal: Bowel sounds 4 quadrants.  Patient has right and left upper abdominal pain with guarding. Musculoskeletal: Full range of motion to all extremities.  Neurologic:  No gross focal neurologic deficits are  appreciated.  Skin:   No rash noted Other:   ED Results / Procedures / Treatments   Labs (all labs ordered are listed, but only abnormal results are displayed) Labs Reviewed  CBC WITH DIFFERENTIAL/PLATELET - Abnormal; Notable for the following components:      Result Value   WBC 11.6 (*)    Neutro Abs 8.8 (*)    All other components within normal limits  COMPREHENSIVE METABOLIC PANEL - Abnormal; Notable for the following components:   CO2 20 (*)    Total Protein 8.2 (*)    All other components within normal limits  URINALYSIS, ROUTINE W REFLEX MICROSCOPIC - Abnormal; Notable for the following components:   Color, Urine YELLOW (*)    APPearance HAZY (*)    Ketones, ur 20 (*)    All other components within normal limits  LIPASE, BLOOD  TYPE AND SCREEN  TROPONIN I (HIGH SENSITIVITY)  TROPONIN I (HIGH SENSITIVITY)     EKG     RADIOLOGY  I personally viewed and evaluated these images as part of my medical decision making, as well as reviewing the written report by the radiologist.  ED Provider  Interpretation: CTs of the chest, abdomen and pelvis unremarkable.  No acute bony abnormalities on CTs of the cervical, thoracic and lumbar spine.  No evidence of intracranial bleed or skull fracture on CTs of the head.   PROCEDURES:  Critical Care performed: No  Procedures   MEDICATIONS ORDERED IN ED: Medications  morphine (PF) 4 MG/ML injection 4 mg (4 mg Intravenous Given 04/24/22 1749)  ondansetron (ZOFRAN) injection 4 mg (4 mg Intravenous Given 04/24/22 1748)  iohexol (OMNIPAQUE) 300 MG/ML solution 100 mL (100 mLs Intravenous Contrast Given 04/24/22 1841)     IMPRESSION / MDM / ASSESSMENT AND PLAN / ED COURSE  I reviewed the triage vital signs and the nursing notes.                              Assessment and plan: Blunt trauma 46 year old male presents to the emergency department after he was pinned underneath multiple sheets of plywood complaining of anterior  chest pain and upper abdominal pain.   CBC indicated mild elevation in white blood cell count.  CMP within reference range.  Lipase within reference range.  Urinalysis shows no signs of UTI.  CTs of the head, cervical spine, chest, abdomen and pelvis, thoracic spine and lumbar spine largely unremarkable.  We will treat patient with Percocet for pain and have patient follow-up with primary care.  FINAL CLINICAL IMPRESSION(S) / ED DIAGNOSES   Final diagnoses:  Pain of upper abdomen     Rx / DC Orders   ED Discharge Orders          Ordered    oxyCODONE-acetaminophen (PERCOCET/ROXICET) 5-325 MG tablet  Every 6 hours PRN        04/24/22 1936    ondansetron (ZOFRAN-ODT) 4 MG disintegrating tablet  Every 8 hours PRN,   Status:  Discontinued        04/24/22 1936    oxyCODONE-acetaminophen (PERCOCET) 7.5-325 MG tablet  Every 6 hours PRN        04/24/22 2034    ondansetron (ZOFRAN-ODT) 4 MG disintegrating tablet  Every 8 hours PRN        04/24/22 2034             Note:  This document was prepared using Dragon voice recognition software and may include unintentional dictation errors.   Pia Mau [place], Cordelia Poche 04/24/22 2114    [name], MD 04/25/22 [place]

## 2022-05-08 ENCOUNTER — Other Ambulatory Visit: Payer: Self-pay

## 2022-05-08 ENCOUNTER — Other Ambulatory Visit: Payer: Self-pay | Admitting: Emergency Medicine

## 2022-05-08 MED ORDER — CITALOPRAM HYDROBROMIDE 10 MG PO TABS
10.0000 mg | ORAL_TABLET | Freq: Every day | ORAL | 0 refills | Status: DC
  Filled 2022-05-08: qty 30, 30d supply, fill #0

## 2022-05-09 ENCOUNTER — Other Ambulatory Visit: Payer: Self-pay

## 2022-05-16 ENCOUNTER — Ambulatory Visit: Payer: Self-pay

## 2022-05-20 ENCOUNTER — Other Ambulatory Visit: Payer: Self-pay

## 2022-05-21 ENCOUNTER — Other Ambulatory Visit: Payer: Self-pay

## 2022-05-23 ENCOUNTER — Telehealth: Payer: Self-pay

## 2022-05-23 ENCOUNTER — Ambulatory Visit: Payer: Self-pay

## 2022-05-23 ENCOUNTER — Other Ambulatory Visit: Payer: Self-pay

## 2022-06-06 ENCOUNTER — Other Ambulatory Visit: Payer: Self-pay

## 2022-06-06 ENCOUNTER — Ambulatory Visit: Payer: Self-pay

## 2022-06-06 DIAGNOSIS — E785 Hyperlipidemia, unspecified: Secondary | ICD-10-CM

## 2022-06-07 LAB — LIPID PANEL
Chol/HDL Ratio: 3.2 ratio (ref 0.0–5.0)
Cholesterol, Total: 208 mg/dL — ABNORMAL HIGH (ref 100–199)
HDL: 65 mg/dL (ref >39)
LDL Chol Calc (NIH): 113 mg/dL — ABNORMAL HIGH (ref 0–99)
Triglycerides: 175 mg/dL — ABNORMAL HIGH (ref 0–149)
VLDL Cholesterol Cal: 30 mg/dL (ref 5–40)

## 2022-06-13 ENCOUNTER — Ambulatory Visit: Payer: Self-pay

## 2022-06-20 ENCOUNTER — Ambulatory Visit: Payer: Self-pay | Admitting: General Practice

## 2022-06-20 VITALS — BP 147/88 | HR 100 | Temp 98.6°F | Resp 14 | Ht 65.0 in | Wt 151.9 lb

## 2022-06-20 DIAGNOSIS — E785 Hyperlipidemia, unspecified: Secondary | ICD-10-CM

## 2022-06-20 DIAGNOSIS — Z720 Tobacco use: Secondary | ICD-10-CM

## 2022-06-20 DIAGNOSIS — R062 Wheezing: Secondary | ICD-10-CM

## 2022-06-20 DIAGNOSIS — F321 Major depressive disorder, single episode, moderate: Secondary | ICD-10-CM

## 2022-06-20 DIAGNOSIS — R7989 Other specified abnormal findings of blood chemistry: Secondary | ICD-10-CM

## 2022-06-20 DIAGNOSIS — I10 Essential (primary) hypertension: Secondary | ICD-10-CM

## 2022-06-20 MED ORDER — VITAMIN D (ERGOCALCIFEROL) 1.25 MG (50000 UNIT) PO CAPS
50000.0000 [IU] | ORAL_CAPSULE | ORAL | 3 refills | Status: DC
  Filled 2022-06-20: qty 5, 35d supply, fill #0
  Filled 2022-07-24: qty 5, 35d supply, fill #1

## 2022-06-20 MED ORDER — ALBUTEROL SULFATE HFA 108 (90 BASE) MCG/ACT IN AERS
2.0000 | INHALATION_SPRAY | Freq: Four times a day (QID) | RESPIRATORY_TRACT | 2 refills | Status: DC | PRN
  Filled 2022-06-20: qty 6.7, 25d supply, fill #0
  Filled 2023-01-07: qty 6.7, 25d supply, fill #1

## 2022-06-20 MED ORDER — VITAMIN B-12 500 MCG PO TABS
1000.0000 ug | ORAL_TABLET | Freq: Two times a day (BID) | ORAL | 2 refills | Status: DC
  Filled 2022-06-20: qty 120, 30d supply, fill #0
  Filled 2022-07-24: qty 100, 25d supply, fill #1
  Filled 2022-09-26: qty 100, 25d supply, fill #2

## 2022-06-20 MED ORDER — FOLIC ACID 1 MG PO TABS
1.0000 mg | ORAL_TABLET | Freq: Every day | ORAL | 2 refills | Status: DC
  Filled 2022-06-20: qty 90, 90d supply, fill #0
  Filled 2022-09-19: qty 90, 90d supply, fill #1
  Filled 2023-01-07: qty 90, 90d supply, fill #2

## 2022-06-20 MED ORDER — THIAMINE HCL 100 MG PO TABS
100.0000 mg | ORAL_TABLET | Freq: Every day | ORAL | 2 refills | Status: DC
  Filled 2022-06-20: qty 80, 80d supply, fill #0
  Filled 2022-09-26: qty 80, 80d supply, fill #1
  Filled 2023-01-07: qty 70, 70d supply, fill #2

## 2022-06-20 MED ORDER — PRAVASTATIN SODIUM 10 MG PO TABS
10.0000 mg | ORAL_TABLET | Freq: Every day | ORAL | 2 refills | Status: DC
  Filled 2022-06-20: qty 90, 90d supply, fill #0

## 2022-06-20 MED ORDER — CITALOPRAM HYDROBROMIDE 10 MG PO TABS
10.0000 mg | ORAL_TABLET | Freq: Every day | ORAL | 0 refills | Status: DC
  Filled 2022-06-20: qty 30, 30d supply, fill #0

## 2022-06-20 MED ORDER — AMLODIPINE BESYLATE 10 MG PO TABS
10.0000 mg | ORAL_TABLET | Freq: Every day | ORAL | 2 refills | Status: DC
  Filled 2022-06-20: qty 90, 90d supply, fill #0

## 2022-06-20 NOTE — Progress Notes (Signed)
Established Patient Office Visit  Subjective   Patient ID: Travis Leach, male    DOB: May 31, 1976  Age: 46 y.o. MRN: [id number]  Chief Complaint  Patient presents with   Follow-up    Review labs and medication refills   Asthma    worsening    Pt in clinic today for lab result follow up, needs medication refills and reports increase of asthma symptoms.  Pt reports that he has been walking and feeling SOB while walking.  Pt has history of asthma as child and has not had symptoms for years.   Pt does smoke 1/2 ppd of cigarettes.  Pt reports that he as been compliant with his medications and and no trouble with them .    Asthma He complains of shortness of breath and wheezing. His past medical history is significant for asthma.      Review of Systems  Constitutional: Negative.   HENT: Negative.    Respiratory:  Positive for shortness of breath and wheezing.   Cardiovascular: Negative.   Gastrointestinal: Negative.   Genitourinary: Negative.   Musculoskeletal: Negative.   Skin: Negative.   Neurological: Negative.   Endo/Heme/Allergies: Negative.   Psychiatric/Behavioral: Negative.        Objective:     BP (!) 147/88 (BP Location: Left Arm)   Pulse 100   Temp 98.6 F (37 C)   Resp 14   Ht 5\' 5"  (1.651 m)   Wt 151 lb 14.4 oz (68.9 kg)   SpO2 95%   BMI 25.28 kg/m    Physical Exam Constitutional:      Appearance: Normal appearance. He is normal weight.  HENT:     Head: Normocephalic and atraumatic.  Cardiovascular:     Rate and Rhythm: Normal rate and regular rhythm.     Pulses: Normal pulses.     Heart sounds: Normal heart sounds.  Pulmonary:     Effort: No respiratory distress.     Breath sounds: No stridor. Wheezing and rhonchi present. No rales.  Chest:     Chest wall: No tenderness.  Skin:    General: Skin is warm and dry.  Neurological:     Mental Status: He is alert and oriented to person, place, and time.  Psychiatric:        Mood and Affect:  Mood normal.        Behavior: Behavior normal.      No results found for any visits on 06/20/22.    The 10-year ASCVD risk score (Arnett DK, et al., 2019) is: 13.9%    Assessment & Plan:   Problem List Items Addressed This Visit       Cardiovascular and Mediastinum   Hypertension   Relevant Medications   amLODipine (NORVASC) 10 MG tablet   pravastatin (PRAVACHOL) 10 MG tablet     Other   Elevated liver function tests   Relevant Medications   folic acid (FOLVITE) 1 MG tablet   thiamine (VITAMIN B-1) 100 MG tablet   vitamin B-12 (CYANOCOBALAMIN) 500 MCG tablet   Vitamin D, Ergocalciferol, (DRISDOL) 1.25 MG (50000 UNIT) CAPS capsule   Elevated lipids - Primary   Relevant Medications   pravastatin (PRAVACHOL) 10 MG tablet   Other Visit Diagnoses     Wheezing on expiration       Relevant Medications   albuterol (VENTOLIN HFA) 108 (90 Base) MCG/ACT inhaler   Depression, major, single episode, moderate (HCC)       Relevant Medications   citalopram (  CELEXA) 10 MG tablet     1. Elevated lipids Reviewed labs with patient, discussed diet and exercise to help lower lipids. Medication refill sent.  - pravastatin (PRAVACHOL) 10 MG tablet; Take 1 tablet (10 mg total) by mouth once daily.  Dispense: 30 tablet; Refill: 2  2. Wheezing on expiration Wheezing on assessment.   Discussed using inhaler. RX sent to listed pharmacy.  - albuterol (VENTOLIN HFA) 108 (90 Base) MCG/ACT inhaler; Inhale 2 puffs into the lungs every 6 (six) hours as needed for wheezing or shortness of breath.  Dispense: 1 each; Refill: 2  3. Hypertension, unspecified type Reviewed life style changes and medication management  Medication refill sent.  - amLODipine (NORVASC) 10 MG tablet; Take 1 tablet (10 mg total) by mouth once daily.  Dispense: 90 tablet; Refill: 2  4. Elevated liver function tests Reviewed labs  Discussed llifestyle changes  Medication refill sent.  - folic acid (FOLVITE) 1 MG  tablet; Take 1 tablet (1 mg total) by mouth once daily.  Dispense: 90 tablet; Refill: 2 - thiamine (VITAMIN B-1) 100 MG tablet; Take 1 tablet (100 mg total) by mouth once daily.  Dispense: 90 tablet; Refill: 2 - vitamin B-12 (CYANOCOBALAMIN) 500 MCG tablet; Take 2 tablets (1,000 mcg total) by mouth in the morning and at bedtime.  Dispense: 360 tablet; Refill: 2 - Vitamin D, Ergocalciferol, (DRISDOL) 1.25 MG (50000 UNIT) CAPS capsule; Take 1 capsule (50,000 Units total) by mouth once every 7 (seven) days.  Dispense: 5 capsule; Refill: 3  5. Depression, major, single episode, moderate ([place]) Pt denies depression s/sx at this time.  Denies harm to self or others.   - citalopram (CELEXA) 10 MG tablet; Take 1 tablet (10 mg total) by mouth once daily.  Dispense: 30 tablet; Refill: 0  6. Tobacco  Discussed with patient about decreasing smoking.  Pt is down to 1/2 PPD.   Discussed making goal to decrease to 1/4 PPD by the end of the year.  Patient agreed that this is a goal he can work on.    Return in about 4 weeks (around 07/18/2022) for Asthma follow up .    Junious Dresser, FNP

## 2022-06-21 ENCOUNTER — Other Ambulatory Visit: Payer: Self-pay

## 2022-06-24 ENCOUNTER — Other Ambulatory Visit: Payer: Self-pay

## 2022-07-18 ENCOUNTER — Ambulatory Visit: Payer: Self-pay

## 2022-07-24 ENCOUNTER — Other Ambulatory Visit: Payer: Self-pay | Admitting: Gerontology

## 2022-07-24 ENCOUNTER — Other Ambulatory Visit: Payer: Self-pay

## 2022-07-24 DIAGNOSIS — F321 Major depressive disorder, single episode, moderate: Secondary | ICD-10-CM

## 2022-07-25 ENCOUNTER — Telehealth: Payer: Self-pay | Admitting: Emergency Medicine

## 2022-07-25 ENCOUNTER — Other Ambulatory Visit: Payer: Self-pay

## 2022-07-25 MED FILL — Citalopram Hydrobromide Tab 10 MG (Base Equiv): ORAL | 15 days supply | Qty: 15 | Fill #0 | Status: AC

## 2022-07-25 NOTE — Telephone Encounter (Signed)
Called patient re: refill request for Citalopram. Advised patient that he will need to follow up with our LCSW or [facility] for treatment. Can refill until appointment with above. Patient agreed and scheduled appointment with Nira Conn, LCSW for 08/08/22. Refilled Citalopram for #15/0 RF.

## 2022-07-30 ENCOUNTER — Other Ambulatory Visit: Payer: Self-pay

## 2022-08-08 ENCOUNTER — Other Ambulatory Visit: Payer: Self-pay

## 2022-08-08 ENCOUNTER — Institutional Professional Consult (permissible substitution): Payer: Self-pay | Admitting: Licensed Clinical Social Worker

## 2022-08-08 ENCOUNTER — Other Ambulatory Visit: Payer: Self-pay | Admitting: Gerontology

## 2022-08-08 ENCOUNTER — Ambulatory Visit: Payer: Self-pay | Admitting: Gerontology

## 2022-08-08 VITALS — BP 124/76 | HR 92 | Temp 98.0°F | Resp 16 | Ht 68.5 in | Wt 151.8 lb

## 2022-08-08 DIAGNOSIS — I10 Essential (primary) hypertension: Secondary | ICD-10-CM

## 2022-08-08 DIAGNOSIS — F321 Major depressive disorder, single episode, moderate: Secondary | ICD-10-CM

## 2022-08-08 DIAGNOSIS — Z0000 Encounter for general adult medical examination without abnormal findings: Secondary | ICD-10-CM

## 2022-08-08 DIAGNOSIS — E785 Hyperlipidemia, unspecified: Secondary | ICD-10-CM

## 2022-08-08 MED ORDER — CITALOPRAM HYDROBROMIDE 10 MG PO TABS
10.0000 mg | ORAL_TABLET | Freq: Every day | ORAL | 0 refills | Status: DC
  Filled 2022-08-08: qty 15, 15d supply, fill #0

## 2022-08-08 MED ORDER — AMLODIPINE BESYLATE 10 MG PO TABS
10.0000 mg | ORAL_TABLET | Freq: Every day | ORAL | 1 refills | Status: DC
  Filled 2022-08-08 – 2022-09-19 (×2): qty 90, 90d supply, fill #0
  Filled 2023-01-07: qty 90, 90d supply, fill #1

## 2022-08-08 MED ORDER — PRAVASTATIN SODIUM 10 MG PO TABS
10.0000 mg | ORAL_TABLET | Freq: Every day | ORAL | 1 refills | Status: DC
  Filled 2022-08-08 – 2022-09-19 (×2): qty 90, 90d supply, fill #0
  Filled 2023-01-07: qty 90, 90d supply, fill #1

## 2022-08-08 NOTE — Patient Instructions (Signed)
DASH Eating Plan DASH stands for Dietary Approaches to Stop Hypertension. The DASH eating plan is a healthy eating plan that has been shown to: Reduce high blood pressure (hypertension). Reduce your risk for type 2 diabetes, heart disease, and stroke. Help with weight loss. What are tips for following this plan? Reading food labels Check food labels for the amount of salt (sodium) per serving. Choose foods with less than 5 percent of the Daily Value of sodium. Generally, foods with less than 300 milligrams (mg) of sodium per serving fit into this eating plan. To find whole grains, look for the word "whole" as the first word in the ingredient list. Shopping Buy products labeled as "low-sodium" or "no salt added." Buy fresh foods. Avoid canned foods and pre-made or frozen meals. Cooking Avoid adding salt when cooking. Use salt-free seasonings or herbs instead of table salt or sea salt. Check with your health care provider or pharmacist before using salt substitutes. Do not fry foods. Cook foods using healthy methods such as baking, boiling, grilling, roasting, and broiling instead. Cook with heart-healthy oils, such as olive, canola, avocado, soybean, or sunflower oil. Meal planning  Eat a balanced diet that includes: 4 or more servings of fruits and 4 or more servings of vegetables each day. Try to fill one-half of your plate with fruits and vegetables. 6-8 servings of whole grains each day. Less than 6 oz (170 g) of lean meat, poultry, or fish each day. A 3-oz (85-g) serving of meat is about the same size as a deck of cards. One egg equals 1 oz (28 g). 2-3 servings of low-fat dairy each day. One serving is 1 cup (237 mL). 1 serving of nuts, seeds, or beans 5 times each week. 2-3 servings of heart-healthy fats. Healthy fats called omega-3 fatty acids are found in foods such as walnuts, flaxseeds, fortified milks, and eggs. These fats are also found in cold-water fish, such as sardines, salmon,  and mackerel. Limit how much you eat of: Canned or prepackaged foods. Food that is high in trans fat, such as some fried foods. Food that is high in saturated fat, such as fatty meat. Desserts and other sweets, sugary drinks, and other foods with added sugar. Full-fat dairy products. Do not salt foods before eating. Do not eat more than 4 egg yolks a week. Try to eat at least 2 vegetarian meals a week. Eat more home-cooked food and less restaurant, buffet, and fast food. Lifestyle When eating at a restaurant, ask that your food be prepared with less salt or no salt, if possible. If you drink alcohol: Limit how much you use to: 0-1 drink a day for women who are not pregnant. 0-2 drinks a day for men. Be aware of how much alcohol is in your drink. In the [place] one drink equals one 12 oz bottle of beer (355 mL), one 5 oz glass of wine (148 mL), or one 1 oz glass of hard liquor (44 mL). General information Avoid eating more than 2,300 mg of salt a day. If you have hypertension, you may need to reduce your sodium intake to 1,500 mg a day. Work with your health care provider to maintain a healthy body weight or to lose weight. Ask what an ideal weight is for you. Get at least 30 minutes of exercise that causes your heart to beat faster (aerobic exercise) most days of the week. Activities may include walking, swimming, or biking. Work with your health care provider or dietitian to   adjust your eating plan to your individual calorie needs. What foods should I eat? Fruits All fresh, dried, or frozen fruit. Canned fruit in natural juice (without added sugar). Vegetables Fresh or frozen vegetables (raw, steamed, roasted, or grilled). Low-sodium or reduced-sodium tomato and vegetable juice. Low-sodium or reduced-sodium tomato sauce and tomato paste. Low-sodium or reduced-sodium canned vegetables. Grains Whole-grain or whole-wheat bread. Whole-grain or whole-wheat pasta. Brown rice. Oatmeal. Quinoa.  Bulgur. Whole-grain and low-sodium cereals. Pita bread. Low-fat, low-sodium crackers. Whole-wheat flour tortillas. Meats and other proteins Skinless chicken or turkey. Ground chicken or turkey. Pork with fat trimmed off. Fish and seafood. Egg whites. Dried beans, peas, or lentils. Unsalted nuts, nut butters, and seeds. Unsalted canned beans. Lean cuts of beef with fat trimmed off. Low-sodium, lean precooked or cured meat, such as sausages or meat loaves. Dairy Low-fat (1%) or fat-free (skim) milk. Reduced-fat, low-fat, or fat-free cheeses. Nonfat, low-sodium ricotta or cottage cheese. Low-fat or nonfat yogurt. Low-fat, low-sodium cheese. Fats and oils Soft margarine without trans fats. Vegetable oil. Reduced-fat, low-fat, or light mayonnaise and salad dressings (reduced-sodium). Canola, safflower, olive, avocado, soybean, and sunflower oils. Avocado. Seasonings and condiments Herbs. Spices. Seasoning mixes without salt. Other foods Unsalted popcorn and pretzels. Fat-free sweets. The items listed above may not be a complete list of foods and beverages you can eat. Contact a dietitian for more information. What foods should I avoid? Fruits Canned fruit in a light or heavy syrup. Fried fruit. Fruit in cream or butter sauce. Vegetables Creamed or fried vegetables. Vegetables in a cheese sauce. Regular canned vegetables (not low-sodium or reduced-sodium). Regular canned tomato sauce and paste (not low-sodium or reduced-sodium). Regular tomato and vegetable juice (not low-sodium or reduced-sodium). Pickles. Olives. Grains Baked goods made with fat, such as croissants, muffins, or some breads. Dry pasta or rice meal packs. Meats and other proteins Fatty cuts of meat. Ribs. Fried meat. Bacon. Bologna, salami, and other precooked or cured meats, such as sausages or meat loaves. Fat from the back of a pig (fatback). Bratwurst. Salted nuts and seeds. Canned beans with added salt. Canned or smoked fish.  Whole eggs or egg yolks. Chicken or turkey with skin. Dairy Whole or 2% milk, cream, and half-and-half. Whole or full-fat cream cheese. Whole-fat or sweetened yogurt. Full-fat cheese. Nondairy creamers. Whipped toppings. Processed cheese and cheese spreads. Fats and oils Butter. Stick margarine. Lard. Shortening. Ghee. Bacon fat. Tropical oils, such as coconut, palm kernel, or palm oil. Seasonings and condiments Onion salt, garlic salt, seasoned salt, table salt, and sea salt. Worcestershire sauce. Tartar sauce. Barbecue sauce. Teriyaki sauce. Soy sauce, including reduced-sodium. Steak sauce. Canned and packaged gravies. Fish sauce. Oyster sauce. Cocktail sauce. Store-bought horseradish. Ketchup. Mustard. Meat flavorings and tenderizers. Bouillon cubes. Hot sauces. Pre-made or packaged marinades. Pre-made or packaged taco seasonings. Relishes. Regular salad dressings. Other foods Salted popcorn and pretzels. The items listed above may not be a complete list of foods and beverages you should avoid. Contact a dietitian for more information. Where to find more information National Heart, Lung, and Blood Institute: www.nhlbi.nih.gov American Heart Association: www.heart.org Academy of Nutrition and Dietetics: www.eatright.org National Kidney Foundation: www.kidney.org Summary The DASH eating plan is a healthy eating plan that has been shown to reduce high blood pressure (hypertension). It may also reduce your risk for type 2 diabetes, heart disease, and stroke. When on the DASH eating plan, aim to eat more fresh fruits and vegetables, whole grains, lean proteins, low-fat dairy, and heart-healthy fats. With the DASH   eating plan, you should limit salt (sodium) intake to 2,300 mg a day. If you have hypertension, you may need to reduce your sodium intake to 1,500 mg a day. Work with your health care provider or dietitian to adjust your eating plan to your individual calorie needs. This information is not  intended to replace advice given to you by your health care provider. Make sure you discuss any questions you have with your health care provider. Document Revised: 08/20/2019 Document Reviewed: 08/20/2019 Elsevier Patient Education  [place]. Asthma, Adult  Asthma is a long-term (chronic) condition that causes recurrent episodes in which the lower airways in the lungs become tight and narrow. The narrowing is caused by inflammation and tightening of the smooth muscle around the lower airways. Asthma episodes, also called asthma attacks or asthma flares, may cause coughing, making high-pitched whistling sounds when you breathe, most often when you breathe out (wheezing), shortness of breath, and chest pain. The airways may produce extra mucus caused by the inflammation and irritation. During an attack, it can be difficult to breathe. Asthma attacks can range from minor to life-threatening. Asthma cannot be cured, but medicines and lifestyle changes can help control it and treat acute attacks. It is important to keep your asthma well controlled so the condition does not interfere with your daily life. What are the causes? This condition is believed to be caused by inherited (genetic) and environmental factors, but its exact cause is not known. What can trigger an asthma attack? Many things can bring on an asthma attack or make symptoms worse. These triggers are different for every person. Common triggers include: Allergens and irritants like mold, dust, pet dander, cockroaches, pollen, air pollution, and chemical odors. Cigarette smoke. Weather changes and cold air. Stress and strong emotional responses such as crying or laughing hard. Certain medications such as aspirin or beta blockers. Infections and inflammatory conditions, such as the flu, a cold, pneumonia, or inflammation of the nasal membranes (rhinitis). Gastroesophageal reflux disease (GERD). What are the signs or  symptoms? Symptoms may occur right after exposure to an asthma trigger or hours later and can vary by person. Common signs and symptoms include: Wheezing. Trouble breathing (shortness of breath). Excessive nighttime or early morning coughing. Chest tightness. Tiredness (fatigue) with minimal activity. Difficulty talking in complete sentences. Poor exercise tolerance. How is this diagnosed? This condition is diagnosed based on: A physical exam and your medical history. Tests, which may include: Lung function studies to evaluate the flow of air in your lungs. Allergy tests. Imaging tests, such as X-rays. How is this treated? There is no cure, but symptoms can be controlled with proper treatment. Treatment usually involves: Identifying and avoiding your asthma triggers. Inhaled medicines. Two types are commonly used to treat asthma, depending on severity: Controller medicines. These help prevent asthma symptoms from occurring. They are taken every day. Fast-acting reliever or rescue medicines. These quickly relieve asthma symptoms. They are used as needed and provide short-term relief. Using other medicines, such as: Allergy medicines, such as antihistamines, if your asthma attacks are triggered by allergens. Immune medicines (immunomodulators). These are medicines that help control the immune system. Using supplemental oxygen. This is only needed during a severe episode. Creating an asthma action plan. An asthma action plan is a written plan for managing and treating your asthma attacks. This plan includes: A list of your asthma triggers and how to avoid them. Information about when medicines should be taken and when their dosage should be  changed. Instructions about using a device called a peak flow meter. A peak flow meter measures how well the lungs are working and the severity of your asthma. It helps you monitor your condition. Follow these instructions at home: Take  over-the-counter and prescription medicines only as told by your health care provider. Stay up to date on all vaccinations as recommended by your healthcare provider, including vaccines for the flu and pneumonia. Use a peak flow meter and keep track of your peak flow readings. Understand and use your asthma action plan to address any asthma flares. Do not smoke or allow anyone to smoke in your home. Contact a health care provider if: You have wheezing, shortness of breath, or a cough that is not responding to medicines. Your medicines are causing side effects, such as a rash, itching, swelling, or trouble breathing. You need to use a reliever medicine more than 2-3 times a week. Your peak flow reading is still at 50-79% of your personal best after following your action plan for 1 hour. You have a fever and shortness of breath. Get help right away if: You are getting worse and do not respond to treatment during an asthma attack. You are short of breath when at rest or when doing very little physical activity. You have difficulty eating, drinking, or talking. You have chest pain or tightness. You develop a fast heartbeat or palpitations. You have a bluish color to your lips or fingernails. You are light-headed or dizzy, or you faint. Your peak flow reading is less than 50% of your personal best. You feel too tired to breathe normally. These symptoms may be an emergency. Get help right away. Call 911. Do not wait to see if the symptoms will go away. Do not drive yourself to the hospital. Summary Asthma is a long-term (chronic) condition that causes recurrent episodes in which the airways become tight and narrow. Asthma episodes, also called asthma attacks or asthma flares, can cause coughing, wheezing, shortness of breath, and chest pain. Asthma cannot be cured, but medicines and lifestyle changes can help keep it well controlled and prevent asthma flares. Make sure you understand how to  avoid triggers and how and when to use your medicines. Asthma attacks can range from minor to life-threatening. Get help right away if you have an asthma attack and do not respond to treatment with your usual rescue medicines. This information is not intended to replace advice given to you by your health care provider. Make sure you discuss any questions you have with your health care provider. Document Revised: 07/04/2021 Document Reviewed: 06/25/2021 Elsevier Patient Education  2023 [employer].

## 2022-08-08 NOTE — Progress Notes (Signed)
Established Patient Office Visit  Subjective   Patient ID: Travis Leach, male    DOB: 1976-06-20  Age: 46 y.o. MRN: [id number]  Chief Complaint  Patient presents with   Follow-up    HPI  Travis Leach is a 46 y.o. male who has history of hypertension, smoking, asthma, history of alcohol use, depression, presents for asthma follow-up visit. He states his breathing has much improved since being prescribed albuterol on 06/20/22. He can now go on daily walks without becoming short of breath and wheezing. He is currently using his albuterol 2-3 times a week. Additionally, he states that he has not been sleeping well and that melatonin gives him vivid dreams and makes his sleep worse. He has cut down on his smoking and is now smoking 1/4 pack per day. He was given [place] [employer] information today in clinic. Overall, he is doing well and offers no further complaints.   Review of Systems  Constitutional: Negative.   Eyes: Negative.   Respiratory: Negative.    Cardiovascular: Negative.   Gastrointestinal: Negative.   Musculoskeletal: Negative.   Neurological: Negative.   Psychiatric/Behavioral: Negative.        Objective:     BP 124/76 (BP Location: Right Arm, Patient Position: Sitting, Cuff Size: Large)   Pulse 92   Temp 98 F (36.7 C) (Oral)   Resp 16   Ht 5' 8.5" (1.74 m)   Wt 151 lb 12.8 oz (68.9 kg)   SpO2 96%   BMI 22.75 kg/m  BP Readings from Last 3 Encounters:  08/08/22 124/76  06/20/22 (!) 147/88  04/24/22 (!) 155/101   Wt Readings from Last 3 Encounters:  08/08/22 151 lb 12.8 oz (68.9 kg)  06/20/22 151 lb 14.4 oz (68.9 kg)  04/24/22 170 lb (77.1 kg)      Physical Exam Constitutional:      Appearance: Normal appearance. He is normal weight.  Cardiovascular:     Rate and Rhythm: Normal rate and regular rhythm.     Pulses: Normal pulses.     Heart sounds: Normal heart sounds.  Pulmonary:     Effort: Pulmonary effort is normal.     Breath sounds: Normal breath  sounds.  Skin:    General: Skin is warm and dry.  Neurological:     General: No focal deficit present.     Mental Status: He is alert and oriented to person, place, and time. Mental status is at baseline.  Psychiatric:        Mood and Affect: Mood normal.        Behavior: Behavior normal.        Thought Content: Thought content normal.        Judgment: Judgment normal.      No results found for any visits on 08/08/22.  Last CBC Lab Results  Component Value Date   WBC 11.6 (H) 04/24/2022   HGB 13.7 04/24/2022   HCT 42.7 04/24/2022   MCV 85.4 04/24/2022   MCH 27.4 04/24/2022   RDW 13.5 04/24/2022   PLT 258 [id number]   Last metabolic panel Lab Results  Component Value Date   GLUCOSE 78 04/24/2022   NA 142 04/24/2022   K 4.2 04/24/2022   CL 109 04/24/2022   CO2 20 (L) 04/24/2022   BUN 9 04/24/2022   CREATININE 0.82 04/24/2022   GFRNONAA >60 04/24/2022   CALCIUM 9.6 04/24/2022   PHOS 5.4 (H) 01/03/2022   PROT 8.2 (H) 04/24/2022   ALBUMIN 4.8  04/24/2022   LABGLOB 2.9 01/03/2022   AGRATIO 1.8 01/03/2022   BILITOT 0.5 04/24/2022   ALKPHOS 86 04/24/2022   AST 32 04/24/2022   ALT 20 04/24/2022   ANIONGAP 13 04/24/2022   Last lipids Lab Results  Component Value Date   CHOL 208 (H) 06/06/2022   HDL 65 06/06/2022   LDLCALC 113 (H) 06/06/2022   TRIG 175 (H) 06/06/2022   CHOLHDL 3.2 06/06/2022   Last hemoglobin A1c Lab Results  Component Value Date   HGBA1C 5.7 (H) 01/03/2022   Last vitamin B12 and Folate Lab Results  Component Value Date   VITAMINB12 1,137 01/03/2022   FOLATE >20.0 01/03/2022      The 10-year ASCVD risk score (Arnett DK, et al., 2019) is: 10.2%    Assessment & Plan:   1. Hypertension, unspecified type - His blood pressure is well-controlled. Continue taking amlodipine daily. DASH diet and exercise as tolerated.  - amLODipine (NORVASC) 10 MG tablet; Take 1 tablet (10 mg total) by mouth once daily.  Dispense: 90 tablet; Refill: 1  2.  Elevated lipids - Continue taking daily pravastatin. Low-cholesterol diet is advised.  - pravastatin (PRAVACHOL) 10 MG tablet; Take 1 tablet (10 mg total) by mouth once daily.  Dispense: 90 tablet; Refill: 1  3. Health maintenance examination - Will check his Vitamin D level today in clinic. He can stop taking his weekly Vitamin D and purchase it over-the-counter if needed.  - Vitamin D (25 hydroxy); Future - Vitamin D (25 hydroxy)   Follow-up in three months, around 11/07/22  Shella Maxim, FNP Student

## 2022-08-09 ENCOUNTER — Other Ambulatory Visit: Payer: Self-pay

## 2022-08-09 LAB — VITAMIN D 25 HYDROXY (VIT D DEFICIENCY, FRACTURES): Vit D, 25-Hydroxy: 74.2 ng/mL (ref 30.0–100.0)

## 2022-08-14 ENCOUNTER — Other Ambulatory Visit: Payer: Self-pay

## 2022-08-15 ENCOUNTER — Ambulatory Visit: Payer: Self-pay | Admitting: Licensed Clinical Social Worker

## 2022-08-15 ENCOUNTER — Institutional Professional Consult (permissible substitution): Payer: Self-pay | Admitting: Licensed Clinical Social Worker

## 2022-08-15 DIAGNOSIS — F321 Major depressive disorder, single episode, moderate: Secondary | ICD-10-CM
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
## 2022-08-20 ENCOUNTER — Ambulatory Visit: Payer: Self-pay | Admitting: Licensed Clinical Social Worker

## 2022-08-20 ENCOUNTER — Other Ambulatory Visit: Payer: Self-pay | Admitting: Gerontology

## 2022-08-20 ENCOUNTER — Other Ambulatory Visit: Payer: Self-pay

## 2022-08-20 DIAGNOSIS — F321 Major depressive disorder, single episode, moderate: Secondary | ICD-10-CM

## 2022-08-20 MED ORDER — MIRTAZAPINE 15 MG PO TABS
15.0000 mg | ORAL_TABLET | Freq: Every day | ORAL | 0 refills | Status: DC
  Filled 2022-08-20: qty 30, 30d supply, fill #0

## 2022-08-20 MED ORDER — CITALOPRAM HYDROBROMIDE 10 MG PO TABS
10.0000 mg | ORAL_TABLET | Freq: Every day | ORAL | 0 refills | Status: DC
  Filled 2022-08-20: qty 30, 30d supply, fill #0

## 2022-08-21 ENCOUNTER — Telehealth: Payer: Self-pay | Admitting: Licensed Clinical Social Worker

## 2022-08-21 NOTE — Telephone Encounter (Signed)
Called patient regarding his missed appointment; the patient's father reported that the patient was not there however his father said he would call back with a phone number to reach the patient.

## 2022-09-02 ENCOUNTER — Other Ambulatory Visit: Payer: Self-pay

## 2022-09-03 ENCOUNTER — Telehealth: Payer: Self-pay

## 2022-09-03 NOTE — Telephone Encounter (Signed)
Called to R/S appt from 08/20/2022. No answer.

## 2022-09-10 NOTE — Telephone Encounter (Signed)
No additional notes

## 2022-09-11 ENCOUNTER — Telehealth: Payer: Self-pay

## 2022-09-11 ENCOUNTER — Other Ambulatory Visit: Payer: Self-pay

## 2022-09-11 ENCOUNTER — Ambulatory Visit: Payer: Self-pay | Admitting: Licensed Clinical Social Worker

## 2022-09-11 DIAGNOSIS — F321 Major depressive disorder, single episode, moderate: Secondary | ICD-10-CM

## 2022-09-11 MED ORDER — MIRTAZAPINE 15 MG PO TABS
15.0000 mg | ORAL_TABLET | Freq: Every day | ORAL | 0 refills | Status: DC
  Filled 2022-09-11: qty 30, 30d supply, fill #0

## 2022-09-11 MED ORDER — CITALOPRAM HYDROBROMIDE 10 MG PO TABS
10.0000 mg | ORAL_TABLET | Freq: Every day | ORAL | 0 refills | Status: DC
  Filled 2022-09-11: qty 30, 30d supply, fill #0

## 2022-09-11 NOTE — Telephone Encounter (Signed)
-----   Message from Judith Part, [place] sent at 09/11/2022 11:07 AM EST ----- Regarding: Medication Refill Good Day,   The patient requests that refills for the following be sent to [facility]:    mirtazapine (REMERON) 15 MG tablet 30 tablet 0-refills    Sig - Route: Take 1 tablet (15 mg total) by mouth at bedtime. - Oral    citalopram (CELEXA) 10 MG tablet 30 tablet 0-refills   Sig - Route: Take 1 tablet (10 mg total) by mouth once daily. - Oral     Thank You

## 2022-09-11 NOTE — BH Specialist Note (Signed)
Integrated Behavioral Health via Telemedicine Visit  09/11/2022 Travis Leach [id number]  Number of [place] Clinician visits: No data recorded Session Start time: No data recorded  Session End time: No data recorded Total time in minutes: No data recorded  Referring Provider: Carlyon Shadow, NP  Patient/Family location: The patient's home [facility] Provider location: [facility] All persons participating in visit: [name] and [name], LCSWA Types of Service: Telephone visit  I connected with [name]  via  Telephone or Video Enabled Telemedicine Application  (Video is Caregility application) and verified that I am speaking with the correct person using two identifiers. Discussed confidentiality: Yes   I discussed the limitations of telemedicine and the availability of in person appointments.  Discussed there is a possibility of technology failure and discussed alternative modes of communication if that failure occurs.  Patient and/or legal guardian expressed understanding and consented to Telemedicine visit: Yes   Presenting Concerns: Patient and/or family reports the following symptoms/concerns: The patient reports that he has been doing about the same since his last follow-up appointment. He explained that he continues to struggle to fall asleep and remain asleep. He shared that occasionally he experiences very vivid and disturbing dreams and wakes up feeling anxious and sweaty. The patient denied starting Mirtazapine and noted that he has not taken Celexa in one week because his prescription ran out. The patient stated that he would like to start taking Mirtazapine and continue to take Celexa. The patient discussed legal stressors impacting his life currently. He noted that he has to go to court on October 11, 2022 and spends a great deal of time worrying about it. The patient shared that he had a great Thanksgiving and spent the  day with family and friends. Kian denied any suicidal or homicidal thoughts.  Duration of problem: Years ; Severity of problem: moderate  Patient and/or Family's Strengths/Protective Factors: Concrete supports in place (healthy food, safe environments, etc.), Sense of purpose, and Physical Health (exercise, healthy diet, medication compliance, etc.)  Goals Addressed: Patient will:  Reduce symptoms of: agitation, anxiety, depression, insomnia, and stress   Increase knowledge and/or ability of: coping skills, healthy habits, self-management skills, and stress reduction   Demonstrate ability to: Increase healthy adjustment to current life circumstances and Increase adequate support systems for patient/family  Progress towards Goals: Ongoing  Interventions: Interventions utilized:  CBT Cognitive Behavioral Therapywas utilized by the clinician during today's follow up session. Clinician met with patient to identify needs related to stressors and functioning, and assess and monitor for signs and symptoms of anxiety and depression, and assess safety. The clinician processed with the patient how they have been doing since the last follow-up session.Clinician measured the patient's anxiety and depression on a numerical scale. Clinician utilized guidance and supervision, provided by psychiatric consultant, to inform patient of the benefits and risks of psychotropic medication use including a verbal summary of [place] warnings. Patient was given the opportunity to ask questions and address concerns regarding the psychiatric consultants recommendations for treatment. The clinician encouraged the patient to utilize their coping skills to deal with their current life circumstances. The session ended with scheduling.  Standardized Assessments completed: GAD-7 and PHQ 9   Assessment: Patient currently experiencing see above.   Patient may benefit from see above.  Plan: Follow up with behavioral health  clinician on : 10/02/2021 at 12:00 noon.  Behavioral recommendations:  Referral(s): [place] (In Clinic)  I discussed the assessment and treatment plan with the patient and/or parent/guardian. They were provided an opportunity to ask questions and all were answered. They agreed with the plan and demonstrated an understanding of the instructions.   They were advised to call back or seek an in-person evaluation if the symptoms worsen or if the condition fails to improve as anticipated.  Lesli Albee, LCSWA

## 2022-09-19 ENCOUNTER — Other Ambulatory Visit: Payer: Self-pay

## 2022-09-26 ENCOUNTER — Other Ambulatory Visit: Payer: Self-pay

## 2022-10-01 ENCOUNTER — Other Ambulatory Visit: Payer: Self-pay

## 2022-10-02 ENCOUNTER — Ambulatory Visit: Payer: Self-pay | Admitting: Licensed Clinical Social Worker

## 2022-10-02 DIAGNOSIS — F1021 Alcohol dependence, in remission: Secondary | ICD-10-CM

## 2022-10-02 NOTE — BH Specialist Note (Signed)
Integrated Behavioral Health via Telemedicine Visit  10/02/2022 JALIL KAMHOLZ [id number]  Number of [place] Clinician visits: No data recorded Session Start time: No data recorded  Session End time: No data recorded Total time in minutes: No data recorded  Referring Provider: Carlyon Shadow NP Patient/Family location: The patient's home  [facility] Provider location: [facility]  All persons participating in visit: [name] and [name], LCSW-A Types of Service: Telephone visit  I connected with [name] via  Telephone or Video Enabled Telemedicine Application  (Video is Caregility application) and verified that I am speaking with the correct person using two identifiers. Discussed confidentiality: Yes   I discussed the limitations of telemedicine and the availability of in person appointments.  Discussed there is a possibility of technology failure and discussed alternative modes of communication if that failure occurs.  Patient and/or legal guardian expressed understanding and consented to Telemedicine visit: Yes   Presenting Concerns: Patient and/or family reports the following symptoms/concerns: The patient reports that he has been doing about the same since his last follow-up session. The patient noted that he wanted to get back into living life and find employment. He discussed situational stressors impacting his life currently. The patient shared that he feels supported by his family and wants to show them he can do well in life. Martese discussed previously struggling with relationships and housing when he was drinking. The patient denied any suicidal or homicidal thoughts.  Duration of problem: Years; Severity of problem: moderate  Patient and/or Family's Strengths/Protective Factors: Concrete supports in place (healthy food, safe environments, etc.), Sense of purpose, and Physical Health (exercise, healthy diet, medication compliance,  etc.)  Goals Addressed: Patient will:  Reduce symptoms of: agitation, anxiety, depression, insomnia, and stress   Increase knowledge and/or ability of: coping skills, healthy habits, self-management skills, and stress reduction   Demonstrate ability to: Increase healthy adjustment to current life circumstances  Progress towards Goals: Ongoing  Interventions: Interventions utilized:  Motivational Interviewingwas utilized by the clinician during today's follow up session. Clinician met with patient to identify needs related to stressors and functioning, and assess and monitor for signs and symptoms of anxiety and depression, and assess safety. The clinician processed with the patient how they have been doing since the last follow-up session. Clinician measured the patient's anxiety, depression, and alcohol use on a numerical scale. Clinician elicited the patient's reasons for change: asked open ended questions to explore the patient's motivations for wanting to address his alcohol misuse, find employment, and improve his interpersonal relationships. Clinician encouraged the patient to reflect on the negative consequences of his alcohol use and the potential benefits of making positive changes. The session ended with scheduling.  Standardized Assessments completed: AUDIT, GAD-7, and PHQ 9 AUDIT=3 GAD-7=12 PHQ-9=05  Patient and/or Family Response:   Assessment: Patient currently experiencing see above.   Patient may benefit from see above.  Plan: Follow up with behavioral health clinician on : 10/08/2022 at noon Behavioral recommendations:  Referral(s): [place] (In Clinic)  I discussed the assessment and treatment plan with the patient and/or parent/guardian. They were provided an opportunity to ask questions and all were answered. They agreed with the plan and demonstrated an understanding of the instructions.   They were advised to call back or seek an  in-person evaluation if the symptoms worsen or if the condition fails to improve as anticipated.  Lesli Albee, LCSWA

## 2022-10-08 ENCOUNTER — Other Ambulatory Visit: Payer: Self-pay

## 2022-10-08 ENCOUNTER — Ambulatory Visit: Payer: Self-pay | Admitting: Licensed Clinical Social Worker

## 2022-10-10 ENCOUNTER — Ambulatory Visit: Payer: Self-pay | Admitting: Licensed Clinical Social Worker

## 2022-10-10 ENCOUNTER — Telehealth: Payer: Self-pay | Admitting: Licensed Clinical Social Worker

## 2022-10-10 NOTE — Telephone Encounter (Signed)
Called the patient during today's scheduled appointment; no answer, left a voicemail with the clinic contact information so they may reschedule.   

## 2022-10-16 ENCOUNTER — Other Ambulatory Visit: Payer: Self-pay | Admitting: Gerontology

## 2022-10-16 ENCOUNTER — Other Ambulatory Visit: Payer: Self-pay

## 2022-10-16 ENCOUNTER — Ambulatory Visit: Payer: Self-pay | Admitting: Licensed Clinical Social Worker

## 2022-10-16 DIAGNOSIS — F321 Major depressive disorder, single episode, moderate: Secondary | ICD-10-CM

## 2022-10-16 MED ORDER — MIRTAZAPINE 15 MG PO TABS
15.0000 mg | ORAL_TABLET | Freq: Every day | ORAL | 0 refills | Status: DC
  Filled 2022-10-16: qty 30, 30d supply, fill #0

## 2022-10-16 MED ORDER — CITALOPRAM HYDROBROMIDE 10 MG PO TABS
10.0000 mg | ORAL_TABLET | Freq: Every day | ORAL | 0 refills | Status: DC
  Filled 2022-10-16: qty 30, 30d supply, fill #0

## 2022-10-16 NOTE — BH Specialist Note (Signed)
Integrated Behavioral Health via Telemedicine Visit  10/16/2022 Travis Leach [id number]    Referring Provider: Carlyon Shadow, NP  Patient/Family location: The patient's home [facility] Provider location: [facility] All persons participating in visit: [name] and [name], LCSW A Types of Service: Telephone visit  I connected with [name] via  Telephone or Video Enabled Telemedicine Application  (Video is Caregility application) and verified that I am speaking with the correct person using two identifiers. Discussed confidentiality: Yes   I discussed the limitations of telemedicine and the availability of in person appointments.  Discussed there is a possibility of technology failure and discussed alternative modes of communication if that failure occurs.  Patient and/or legal guardian expressed understanding and consented to Telemedicine visit: Yes   Presenting Concerns: Patient and/or family reports the following symptoms/concerns: The Patient reports that he has been doing well since his last follow-up session.  The patient explained that at his recent court hearing all charges against him were dropped.  He noted since that time he has experienced increased motivation to move forward with his life.  The patient discussed his job search efforts and requested to be connected to any community services that may help him find employment. The patient stated that he has been out of Celexa for over a week but continues to take mirtazapine 15 mg at bedtime.  The patient stated he noticed feeling more irritable since speaking to his ex-wife. He identified several coping skills that he has found helpful, such as art, deep breathing, and trying to stay positive, that he has utilized to help him deal with the stressors in his life.  The patient noted overall he feels that he is making progress.  [name] denied any suicidal or homicidal thoughts. Duration of problem:  Years; Severity of problem: moderate  Patient and/or Family's Strengths/Protective Factors: Social connections, Concrete supports in place (healthy food, safe environments, etc.), Sense of purpose, and Physical Health (exercise, healthy diet, medication compliance, etc.)  Goals Addressed: Patient will:  Reduce symptoms of: agitation, anxiety, depression, insomnia, and stress   Increase knowledge and/or ability of: coping skills, healthy habits, self-management skills, and stress reduction   Demonstrate ability to: Increase healthy adjustment to current life circumstances  Progress towards Goals: Ongoing  Interventions: Interventions utilized:  CBT Cognitive Behavioral Therapy was utilized by the clinician during today's follow up session. Clinician met with patient to identify needs related to stressors and functioning, and assess and monitor for signs and symptoms of anxiety and depression, and assess safety. The clinician processed with the patient how they have been doing since the last follow-up session. Clinician measured the patient's anxiety and depression on the numerical scale. Clinician offered to message the patient's primary care provider regarding his refill request for Celexa. Clinician encouraged the patient to continue to work on calming techniques (e.g.. paced breathing, deep muscle relaxation, and calming imagery) as a strategy for responding appropriately to anxiety and move towards increasing the patients self regulation. The session ended with scheduling. Standardized Assessments completed: GAD-7 and PHQ 9 GAD-7= 12 PHQ-9= 04  Patient and/or Family Response: Patient reported increased motivation and improved sleep.   Assessment: Patient currently experiencing see above.   Patient may benefit from see above.  Plan: Follow up with behavioral health clinician on : 10/23/2022 at 10:00 AM. Behavioral recommendations:  Referral(s): [place] (In  Clinic)  I discussed the assessment and treatment plan with the patient and/or parent/guardian. They  were provided an opportunity to ask questions and all were answered. They agreed with the plan and demonstrated an understanding of the instructions.   They were advised to call back or seek an in-person evaluation if the symptoms worsen or if the condition fails to improve as anticipated.  Lesli Albee, LCSWA

## 2022-10-23 ENCOUNTER — Telehealth: Payer: Self-pay | Admitting: Licensed Clinical Social Worker

## 2022-10-23 ENCOUNTER — Ambulatory Visit: Payer: Self-pay | Admitting: Licensed Clinical Social Worker

## 2022-10-23 NOTE — Telephone Encounter (Signed)
Called the patient twice during today's scheduled appointment; no answer, left a voicemail with the clinic contact information so they may reschedule.   

## 2022-10-24 ENCOUNTER — Ambulatory Visit: Payer: Self-pay | Admitting: Licensed Clinical Social Worker

## 2022-10-31 ENCOUNTER — Ambulatory Visit: Payer: Self-pay | Admitting: Licensed Clinical Social Worker

## 2022-11-05 ENCOUNTER — Ambulatory Visit: Payer: Self-pay | Admitting: Licensed Clinical Social Worker

## 2022-11-05 DIAGNOSIS — F321 Major depressive disorder, single episode, moderate: Secondary | ICD-10-CM

## 2022-11-05 NOTE — BH Specialist Note (Signed)
Integrated Behavioral Health via Telemedicine Visit  11/05/2022 Travis Leach [id number]  Number of [place] Clinician visits: No data recorded Session Start time: No data recorded  Session End time: No data recorded Total time in minutes: No data recorded  Referring Provider: Carlyon Shadow, NP  Patient/Family location: The Patient Home  [facility] Provider location: [facility] [place]  All persons participating in visit: Travis Leach  Types of Service: Telephone visit  I connected with Travis Leach (Video is Caregility application) and verified that I am speaking with the correct person using two identifiers. Discussed confidentiality: Yes   I discussed the limitations of telemedicine and the availability of in person appointments.  Discussed there is a possibility of technology failure and discussed alternative modes of communication if that failure occurs.  Patient and/or legal guardian expressed understanding and consented to Telemedicine visit: Yes   Presenting Concerns: Patient and/or family reports the following symptoms/concerns: The patient reports that he has been doing well since his last follow-up session.  Mr. Joye noted that he has started a new job and is enjoying being back at work.  He stated that everything in his life is going really good for him right now.  Mr. Hensen shared that his family has a new puppy and he finds it very comforting to spend time with their new pet.  Mr. Olden denied experiencing anxiety or difficulty falling asleep or remaining asleep.  The patient stated that he felt his overall mood has improved since he started taking mirtazapine.  The patient denied any suicidal or homicidal thoughts. Duration of problem: Years ; Severity of problem: moderate  Patient and/or Family's Strengths/Protective Factors: Social connections, Concrete supports in place (healthy food, safe environments, etc.), Sense of purpose, and  Physical Health (exercise, healthy diet, medication compliance, etc.)  Goals Addressed: Patient will:  Reduce symptoms of: insomnia and stress   Increase knowledge and/or ability of: coping skills, healthy habits, self-management skills, and stress reduction   Demonstrate ability to: Increase healthy adjustment to current life circumstances  Progress towards Goals: Ongoing  Interventions: Interventions utilized:  Sleep Hygiene was utilized by the clinician during today's follow-up session.  Clinician provided Psychoeducation regarding insomnia and the importance of sleep and the impact of poor sleep hygiene on mental health and physical well-being. Clinician congratulated the patient on his new job and worked with the patient to adjust the therapy schedule around his new work hours. Clinician encouraged the patient to continue to work on calming techniques such as progressive muscle relaxation, calming imagery, and controlled breathing as a strategy for responding appropriately to difficulty falling asleep. Clinician discussed with the patient common factors that can disrupt sleep such as excessive use of electronic devices, irregular sleep schedules caffeine intake, and other stimulant use. The session ended with scheduling.  Standardized Assessments completed: GAD-7 and PHQ 9 GAD-7= 0 PHQ-9= 1  Assessment: Patient currently experiencing see above.   Patient may benefit from see above.  Plan: Follow up with behavioral health clinician on : 11/18/2022 at 9:00 AM  Behavioral recommendations:  Referral(s): [place] (In Clinic)  I discussed the assessment and treatment plan with the patient and/or parent/guardian. They were provided an opportunity to ask questions and all were answered. They agreed with the plan and demonstrated an understanding of the instructions.   They were advised to call back or seek an in-person evaluation if the symptoms worsen or if the  condition fails to improve  as anticipated.  Travis Leach, LCSWA

## 2022-11-07 ENCOUNTER — Ambulatory Visit: Payer: Self-pay

## 2022-11-18 ENCOUNTER — Telehealth: Payer: Self-pay | Admitting: Licensed Clinical Social Worker

## 2022-11-18 ENCOUNTER — Ambulatory Visit: Payer: Self-pay | Admitting: Licensed Clinical Social Worker

## 2022-11-18 NOTE — Telephone Encounter (Signed)
Called the patient twice during today's scheduled appointment; no answer, left a voicemail with the clinic contact information so they may reschedule.   

## 2022-11-21 ENCOUNTER — Other Ambulatory Visit: Payer: Self-pay

## 2022-11-25 ENCOUNTER — Ambulatory Visit: Payer: Self-pay | Admitting: Licensed Clinical Social Worker

## 2022-11-27 ENCOUNTER — Other Ambulatory Visit: Payer: Self-pay

## 2022-11-27 ENCOUNTER — Other Ambulatory Visit: Payer: Self-pay | Admitting: Gerontology

## 2022-11-27 DIAGNOSIS — F321 Major depressive disorder, single episode, moderate: Secondary | ICD-10-CM

## 2022-11-27 MED ORDER — CITALOPRAM HYDROBROMIDE 10 MG PO TABS
10.0000 mg | ORAL_TABLET | Freq: Every day | ORAL | 0 refills | Status: DC
  Filled 2022-11-27: qty 30, 30d supply, fill #0

## 2022-12-02 ENCOUNTER — Ambulatory Visit: Payer: Self-pay | Admitting: Licensed Clinical Social Worker

## 2022-12-09 ENCOUNTER — Ambulatory Visit: Payer: Self-pay | Admitting: Licensed Clinical Social Worker

## 2022-12-09 DIAGNOSIS — F321 Major depressive disorder, single episode, moderate: Secondary | ICD-10-CM

## 2022-12-09 NOTE — BH Specialist Note (Signed)
Integrated Behavioral Health via Telemedicine Visit  12/09/2022 SIDDHANTH EVENER [id number]  Number of [place] Clinician visits: No data recorded Session Start time: No data recorded  Session End time: No data recorded Total time in minutes: No data recorded  Referring Provider: Carlyon Shadow, Np  Patient/Family location: The patients home [facility] Provider location: Remote; [place], [place]  All persons participating in visit: [name] and [name], LCSW-A Types of Service: Telephone visit  I connected with [name] via  Telephone or Video Enabled Telemedicine Application  (Video is Caregility application) and verified that I am speaking with the correct person using two identifiers. Discussed confidentiality: Yes   I discussed the limitations of telemedicine and the availability of in person appointments.  Discussed there is a possibility of technology failure and discussed alternative modes of communication if that failure occurs.  Patient and/or legal guardian expressed understanding and consented to Telemedicine visit: Yes   Presenting Concerns: Patient and/or family reports the following symptoms/concerns: The patient reports that he has been doing well since his last follow-up appointment. He noted that he has started a new part time job and is working 2nd shift. Raynel requested that his telephone number updated so that the [facility] calls his telephone number instead of his father's. The patient shared that  a couple of days ago he felt irritable and did not want to be bothered. He explained that after he went to sleep he woke up feeling better. The patient's stated that he was able to refill his prescription for mirtazapine 15 MG and felt the medication was helpful. Matther denied any suicidal or homicidal thoughts.  Duration of problem: Years ; Severity of problem: moderate  Patient and/or Family's Strengths/Protective Factors: Concrete  supports in place (healthy food, safe environments, etc.) and Sense of purpose  Goals Addressed: Patient will:  Reduce symptoms of: agitation, anxiety, depression, and insomnia   Increase knowledge and/or ability of: coping skills, healthy habits, self-management skills, and stress reduction   Demonstrate ability to: Increase healthy adjustment to current life circumstances  Progress towards Goals: Ongoing  Interventions: Interventions utilized:  CBT Cognitive Behavioral Therapywas utilized by the clinician during today's follow up session. Clinician met with patient to identify needs related to stressors and functioning, and assess and monitor for signs and symptoms of anxiety and depression, and assess safety. The clinician processed with the patient how they have been doing since the last follow-up session. Clinician congratulated the patient on his new job and encouraged him to continue to implement his coping skills to deal with his current life's circumstances.  After the telephone call was disconnected, Clinician attempted to call the patient back and left a message with the patient's next appointment date and time and the number to call if he needed to reschedule.  Standardized Assessments completed:  Due to being disconnected will assess at next follow-up.    Assessment: Patient currently experiencing see above.   Patient may benefit from see above.  Plan: Follow up with behavioral health clinician on : 12/17/2022 at 3:00 PM  Behavioral recommendations:  Referral(s): [place] (In Clinic)  I discussed the assessment and treatment plan with the patient and/or parent/guardian. They were provided an opportunity to ask questions and all were answered. They agreed with the plan and demonstrated an understanding of the instructions.   They were advised to call back or seek an in-person evaluation if the symptoms worsen or if the condition fails to  improve as  anticipated.  Lesli Albee, LCSWA

## 2022-12-17 ENCOUNTER — Ambulatory Visit: Payer: Self-pay | Admitting: Licensed Clinical Social Worker

## 2022-12-18 ENCOUNTER — Other Ambulatory Visit: Payer: Self-pay

## 2022-12-24 ENCOUNTER — Ambulatory Visit: Payer: Self-pay | Admitting: Licensed Clinical Social Worker

## 2022-12-24 DIAGNOSIS — F1021 Alcohol dependence, in remission: Secondary | ICD-10-CM

## 2022-12-24 NOTE — BH Specialist Note (Signed)
Integrated Behavioral Health via Telemedicine Visit  12/24/2022 AVIR WUNSCH [id number]  Number of [place] Clinician visits: No data recorded Session Start time: No data recorded  Session End time: No data recorded Total time in minutes: No data recorded  Referring Provider: Carlyon Shadow, NP Patient/Family location: The Patient's Home [facility] Provider location: [facility] [place] All persons participating in visit: [name] and [name], LCSW-A Types of Service: Telephone visit  I connected with [name]  via  Telephone or Video Enabled Telemedicine Application  (Video is Caregility application) and verified that I am speaking with the correct person using two identifiers. Discussed confidentiality: Yes   I discussed the limitations of telemedicine and the availability of in person appointments.  Discussed there is a possibility of technology failure and discussed alternative modes of communication if that failure occurs.  I discussed that engaging in this telemedicine visit, they consent to the provision of behavioral healthcare and the services will be billed under their insurance.  Patient and/or legal guardian expressed understanding and consented to Telemedicine visit: Yes   Presenting Concerns: Patient and/or family reports the following symptoms/concerns: The Patient reported that he has been doing okay since his last follow-up appointment. The patient reports that he continues to take Mirtazapine 15 MG at bedtime and noted improved ability to fall asleep, remain asleep, and he wakes up feeling rested. He shared he has enjoyed an overall improved mood. Lynix stated that he needs refills on several medications including Mirtazapine and asked the clinician to message his provider to see if they could call those prescriptions in for him. The patient expressed feeling that therapy was helpful and he wished the therapist well.  Maxson  denied any suicidal or homicidal thoughts.  Duration of problem: Years; Severity of problem: moderate  Patient and/or Family's Strengths/Protective Factors: Sense of purpose, Physical Health (exercise, healthy diet, medication compliance, etc.), and Caregiver has knowledge of parenting & child development  Goals Addressed: Patient will:  Reduce symptoms of: agitation, anxiety, depression, insomnia, and stress   Increase knowledge and/or ability of: coping skills, healthy habits, self-management skills, and stress reduction   Demonstrate ability to: Increase healthy adjustment to current life circumstances  Progress towards Goals: Revised  Interventions: Interventions utilized:  Medication Monitoring, Link to [employer], and Supportive Reflection was utilized by the clinician during today's follow-up session. Clinician met with patient to assess and monitor for signs and symptoms of anxiety and depression, monitor compliance with prescribed psychotropic medications, and begin the steps to end therapy and transition care to another provider. The clinician processed with the patient how they have been doing since the last follow-up session. Clinician measured the patients anxiety and depression on a numerical scale. Clinician informed the patient that she had put in her notice at the [facility] and her last day will be 01/07/2023. Clinician explained that the patient will receive a letter with referrals to other mental health providers and he should expect two follow up calls from the [facility]. Clinician verbally provided the patient with crisis resources and a referral to [facility]. Clinician processed the patients progress in therapy and provided space for him to have any concerns or questions addressed. Clinician monitored the patients psychotropic medications and determined he is taking mirtazapine 15 MG at bedtime as prescribed and needed a refill. Clinician messaged  the patient's primary care provider, Carlyon Shadow, NP regarding the patients refill request.  Standardized Assessments  completed: GAD-7 and PHQ 2 GAD-7= 0 PHQ-9= 0  Patient and/or Family Response: Patient expressed understanding and agreed with the plan.  Assessment: Patient currently experiencing see above.   Patient may benefit from see above.  Plan: Follow up with behavioral health clinician on :  Behavioral recommendations:  Referral(s): [place] (LME/Outside Clinic) referred to [place] and to Medication Management with [facility] .   I discussed the assessment and treatment plan with the patient and/or parent/guardian. They were provided an opportunity to ask questions and all were answered. They agreed with the plan and demonstrated an understanding of the instructions.   They were advised to call back or seek an in-person evaluation if the symptoms worsen or if the condition fails to improve as anticipated.  Lesli Albee, LCSWA

## 2023-01-07 ENCOUNTER — Other Ambulatory Visit: Payer: Self-pay | Admitting: Gerontology

## 2023-01-07 ENCOUNTER — Telehealth: Payer: Self-pay | Admitting: Emergency Medicine

## 2023-01-07 ENCOUNTER — Other Ambulatory Visit: Payer: Self-pay

## 2023-01-07 DIAGNOSIS — F321 Major depressive disorder, single episode, moderate: Secondary | ICD-10-CM

## 2023-01-07 MED ORDER — CITALOPRAM HYDROBROMIDE 10 MG PO TABS
10.0000 mg | ORAL_TABLET | Freq: Every day | ORAL | 0 refills | Status: DC
  Filled 2023-01-07: qty 30, 30d supply, fill #0

## 2023-01-07 MED ORDER — MIRTAZAPINE 15 MG PO TABS
15.0000 mg | ORAL_TABLET | Freq: Every day | ORAL | 0 refills | Status: DC
  Filled 2023-01-07: qty 30, 30d supply, fill #0

## 2023-01-07 NOTE — Telephone Encounter (Signed)
Call patient to schedule appointment with Lanora Manis. No answer at home or mobile. Left messages to call [facility] on both numbers. Called pharmacy and asked them to put a note in his bag for when he picks up his prescriptions that he must schedule OV with Lanora Manis, NP. Patient last seen 07/2022.

## 2023-01-08 ENCOUNTER — Ambulatory Visit: Payer: Self-pay | Admitting: Gerontology

## 2023-01-08 ENCOUNTER — Other Ambulatory Visit: Payer: Self-pay

## 2023-01-14 ENCOUNTER — Ambulatory Visit: Payer: Self-pay | Admitting: Gerontology

## 2023-01-16 ENCOUNTER — Ambulatory Visit: Payer: Self-pay | Admitting: Gerontology

## 2023-01-22 ENCOUNTER — Ambulatory Visit: Payer: Self-pay | Admitting: Gerontology

## 2023-01-29 ENCOUNTER — Ambulatory Visit: Payer: Self-pay | Admitting: Gerontology

## 2023-01-29 ENCOUNTER — Encounter: Payer: Self-pay | Admitting: Gerontology

## 2023-01-29 ENCOUNTER — Other Ambulatory Visit: Payer: Self-pay

## 2023-01-29 VITALS — BP 136/84 | HR 85 | Temp 98.4°F | Resp 16 | Ht 69.0 in | Wt 164.2 lb

## 2023-01-29 DIAGNOSIS — Z0000 Encounter for general adult medical examination without abnormal findings: Secondary | ICD-10-CM

## 2023-01-29 DIAGNOSIS — F321 Major depressive disorder, single episode, moderate: Secondary | ICD-10-CM

## 2023-01-29 DIAGNOSIS — R7989 Other specified abnormal findings of blood chemistry: Secondary | ICD-10-CM

## 2023-01-29 DIAGNOSIS — I10 Essential (primary) hypertension: Secondary | ICD-10-CM

## 2023-01-29 DIAGNOSIS — E785 Hyperlipidemia, unspecified: Secondary | ICD-10-CM

## 2023-01-29 DIAGNOSIS — R062 Wheezing: Secondary | ICD-10-CM

## 2023-01-29 MED ORDER — FOLIC ACID 1 MG PO TABS
1.0000 mg | ORAL_TABLET | Freq: Every day | ORAL | 2 refills | Status: DC
  Filled 2023-01-29 – 2023-04-01 (×3): qty 90, 90d supply, fill #0
  Filled 2023-08-26: qty 90, 90d supply, fill #1

## 2023-01-29 MED ORDER — PRAVASTATIN SODIUM 10 MG PO TABS
10.0000 mg | ORAL_TABLET | Freq: Every day | ORAL | 1 refills | Status: DC
  Filled 2023-01-29 – 2023-04-01 (×2): qty 90, 90d supply, fill #0
  Filled 2023-08-26: qty 90, 90d supply, fill #1

## 2023-01-29 MED ORDER — THIAMINE HCL 100 MG PO TABS
100.0000 mg | ORAL_TABLET | Freq: Every day | ORAL | 2 refills | Status: DC
  Filled 2023-01-29 – 2023-04-01 (×2): qty 90, 90d supply, fill #0

## 2023-01-29 MED ORDER — MIRTAZAPINE 15 MG PO TABS
15.0000 mg | ORAL_TABLET | Freq: Every day | ORAL | 0 refills | Status: DC
  Filled 2023-01-29: qty 30, 30d supply, fill #0

## 2023-01-29 MED ORDER — ALBUTEROL SULFATE HFA 108 (90 BASE) MCG/ACT IN AERS
2.0000 | INHALATION_SPRAY | Freq: Four times a day (QID) | RESPIRATORY_TRACT | 2 refills | Status: DC | PRN
  Filled 2023-01-29: qty 6.7, 25d supply, fill #0
  Filled 2023-07-25: qty 6.7, 25d supply, fill #1

## 2023-01-29 MED ORDER — CITALOPRAM HYDROBROMIDE 10 MG PO TABS
10.0000 mg | ORAL_TABLET | Freq: Every day | ORAL | 0 refills | Status: DC
  Filled 2023-01-29: qty 30, 30d supply, fill #0

## 2023-01-29 NOTE — Progress Notes (Signed)
Established Patient Office Visit  Subjective   Patient ID: Travis Leach, male    DOB: 1976-05-13  Age: 47 y.o. MRN: [id number]  Chief Complaint  Patient presents with   Follow-up   Hypertension   Depression   HPI  CHAIS Leach is a 47 y.o. male who has history of hypertension, smoking, asthma, alcohol use, depression, presents for routine follow-up visit and refills medications. He is compliant with his medications without side effects and keeps health life style changes. He states his breathing has much improved with using albuterol two to three times a week and as needed. He can perform daily walks without becoming short of breath and wheezing. He denies chest  pain, headache, or dizziness. He states that he sleeps very well after prescribed mirtazapine 15 mg daily. He as been seen [facility] for his depression, last visit was 12/09/2022. Today, he states his mood is good, denies suicidal nor homicidal ideation. He takes citalopram 10 mg daily. In addition, He has been working on cutting smoking and is now smoking 1/2 pack a week. Overall, he is doing well and offers no further complaints.     Review of Systems  Constitutional: Negative.   HENT: Negative.    Eyes: Negative.   Respiratory: Negative.    Cardiovascular: Negative.   Gastrointestinal: Negative.   Genitourinary: Negative.   Musculoskeletal: Negative.   Skin: Negative.   Endo/Heme/Allergies: Negative.   Psychiatric/Behavioral: Negative.        Objective:     BP 136/84 (BP Location: Right Arm, Patient Position: Sitting, Cuff Size: Large)   Pulse 85   Temp 98.4 F (36.9 C) (Oral)   Resp 16   Ht 5\' 9"  (1.753 m)   Wt 164 lb 3.2 oz (74.5 kg)   SpO2 95%   BMI 24.25 kg/m  BP Readings from Last 3 Encounters:  01/29/23 136/84  08/08/22 124/76  06/20/22 (!) 147/88   Wt Readings from Last 3 Encounters:  01/29/23 164 lb 3.2 oz (74.5 kg)  08/08/22 151 lb 12.8 oz (68.9 kg)  06/20/22 151 lb 14.4 oz (68.9 kg)       Physical Exam Constitutional:      Appearance: Normal appearance.  HENT:     Head: Normocephalic.     Right Ear: Tympanic membrane normal.     Left Ear: Tympanic membrane normal.     Nose: Nose normal.     Mouth/Throat:     Mouth: Mucous membranes are moist.  Eyes:     Pupils: Pupils are equal, round, and reactive to light.  Cardiovascular:     Rate and Rhythm: Normal rate and regular rhythm.  Pulmonary:     Effort: Pulmonary effort is normal.     Breath sounds: Normal breath sounds.  Abdominal:     General: Abdomen is flat.     Palpations: Abdomen is soft.  Musculoskeletal:        General: Normal range of motion.     Cervical back: Normal range of motion and neck supple.  Skin:    General: Skin is warm and dry.  Neurological:     General: No focal deficit present.     Mental Status: He is alert and oriented to person, place, and time.  Psychiatric:        Mood and Affect: Mood normal.        Behavior: Behavior normal.     No results found for any visits on 01/29/23.  Last CBC Lab Results  Component Value Date   WBC 11.6 (H) 04/24/2022   HGB 13.7 04/24/2022   HCT 42.7 04/24/2022   MCV 85.4 04/24/2022   MCH 27.4 04/24/2022   RDW 13.5 04/24/2022   PLT 258 04/24/2022   Last metabolic panel Lab Results  Component Value Date   GLUCOSE 78 04/24/2022   NA 142 04/24/2022   K 4.2 04/24/2022   CL 109 04/24/2022   CO2 20 (L) 04/24/2022   BUN 9 04/24/2022   CREATININE 0.82 04/24/2022   GFRNONAA >60 04/24/2022   CALCIUM 9.6 04/24/2022   PHOS 5.4 (H) 01/03/2022   PROT 8.2 (H) 04/24/2022   ALBUMIN 4.8 04/24/2022   LABGLOB 2.9 01/03/2022   AGRATIO 1.8 01/03/2022   BILITOT 0.5 04/24/2022   ALKPHOS 86 04/24/2022   AST 32 04/24/2022   ALT 20 04/24/2022   ANIONGAP 13 04/24/2022   Last lipids Lab Results  Component Value Date   CHOL 208 (H) 06/06/2022   HDL 65 06/06/2022   LDLCALC 113 (H) 06/06/2022   TRIG 175 (H) 06/06/2022   CHOLHDL 3.2 06/06/2022    Last hemoglobin A1c Lab Results  Component Value Date   HGBA1C 5.7 (H) 01/03/2022   Last thyroid functions No results found for: "TSH", "T3TOTAL", "T4TOTAL", "THYROIDAB" Last vitamin D Lab Results  Component Value Date   VD25OH 74.2 08/08/2022   Last vitamin B12 and Folate Lab Results  Component Value Date   VITAMINB12 1,137 01/03/2022   FOLATE >20.0 01/03/2022      The 10-year ASCVD risk score (Arnett DK, et al., 2019) is: 12.1%    Assessment & Plan:  1. Wheezing on expiration His breath is much improved. He is able to perform daily activity without shortness of breath and wheezing. He was educated to call office or go to ER if having chest pain, shortness of breath. Continue current medication.  - albuterol (VENTOLIN HFA) 108 (90 Base) MCG/ACT inhaler; Inhale 2 puffs into the lungs every 6 (six) hours as needed for wheezing or shortness of breath.  Dispense: 6.7 g; Refill: 2  2. Depression, major, single episode, moderate (HCC) His mood is good and normal behavior. He sleeps well. He was instructed to call Crisis help line if having mental crisis. Continue current medications. - citalopram (CELEXA) 10 MG tablet; Take 1 tablet (10 mg total) by mouth once daily.  Dispense: 30 tablet; Refill: 0 - mirtazapine (REMERON) 15 MG tablet; Take 1 tablet (15 mg total) by mouth at bedtime.  Dispense: 30 tablet; Refill: 0  3. Elevated liver function tests Will monitor his liver function 04/23/2023. Continue current medications. - folic acid (FOLVITE) 1 MG tablet; Take 1 tablet (1 mg total) by mouth once daily.  Dispense: 90 tablet; Refill: 2 - thiamine (VITAMIN B1) 100 MG tablet; Take 1 tablet (100 mg total) by mouth daily.  Dispense: 90 tablet; Refill: 2  4. Elevated lipids Will monitor his lipid panel on 04/23/2023. Continue current medications, low fat/cholesterol diet and exercise as tolerated.. - pravastatin (PRAVACHOL) 10 MG tablet; Take 1 tablet (10 mg total) by mouth once daily.   Dispense: 90 tablet; Refill: 1  5. Health care maintenance Scheduled lab on 04/23/2023. He was educated on DASH diet and exercise 150 minutes a week as tolerated. - CBC w/Diff; Future - Comp Met (CMET); Future - Lipid panel; Future - HgB A1c; Future  6. Hypertension, unspecified type His Bp at 136/84 mmHg. His Bp is not under controlled. His goal is <130/80 mmHg. He was educated to  cut salt on his diet and exercise 150 minutes as tolerated. He was educated to call Dr office or go to ED if having blurry vision, headache, trouble with specking and seeing.Continue current medication. -Amlodipine (Norvasc) 10 mg by mouth daily.    Follow up 02/27/2023 for medication review.  Follow up 05/01/2023 in clinic.   Odette Fraction, NP

## 2023-01-29 NOTE — Patient Instructions (Signed)

## 2023-01-31 ENCOUNTER — Other Ambulatory Visit: Payer: Self-pay

## 2023-02-27 ENCOUNTER — Ambulatory Visit: Payer: Self-pay | Admitting: Gerontology

## 2023-02-27 ENCOUNTER — Other Ambulatory Visit: Payer: Self-pay

## 2023-02-27 DIAGNOSIS — I10 Essential (primary) hypertension: Secondary | ICD-10-CM

## 2023-02-27 DIAGNOSIS — F321 Major depressive disorder, single episode, moderate: Secondary | ICD-10-CM

## 2023-02-27 MED ORDER — CITALOPRAM HYDROBROMIDE 10 MG PO TABS
10.0000 mg | ORAL_TABLET | Freq: Every day | ORAL | 0 refills | Status: DC
Start: 2023-02-27 — End: 2023-04-08
  Filled 2023-02-27: qty 30, 30d supply, fill #0

## 2023-02-27 MED ORDER — MIRTAZAPINE 15 MG PO TABS
15.0000 mg | ORAL_TABLET | Freq: Every day | ORAL | 0 refills | Status: DC
Start: 2023-04-23 — End: 2023-04-24
  Filled 2023-02-27: qty 30, 30d supply, fill #0

## 2023-02-27 MED ORDER — AMLODIPINE BESYLATE 10 MG PO TABS
10.0000 mg | ORAL_TABLET | Freq: Every day | ORAL | 1 refills | Status: DC
Start: 2023-02-27 — End: 2023-08-26
  Filled 2023-02-27 – 2023-04-01 (×2): qty 90, 90d supply, fill #0
  Filled 2023-07-25: qty 90, 90d supply, fill #1

## 2023-02-27 NOTE — Progress Notes (Signed)
Established Patient Office Visit  Subjective   Patient ID: Travis Leach, male    DOB: April 01, 1976  Age: 47 y.o. MRN: [id number]  No chief complaint on file. I connected with Reece Agar  via  Telephone or Video Enabled Telemedicine Application  (Video is Caregility application) and verified that I am speaking with the correct person using two identifiers. Discussed confidentiality: Yes    I discussed the limitations of telemedicine and the availability of in person appointments.  Discussed there is a possibility of technology failure and discussed alternative modes of communication if that failure occurs.   I discussed that engaging in this telemedicine visit, they consent to the provision of behavioral healthcare and the services will be billed under their insurance.   Patient and/or legal guardian expressed understanding and consented to Telemedicine visit: Yes   HPI  Travis Leach is a 47 y.o. male who has history of hypertension, smoking, asthma, alcohol use, depression, presents for routine follow-up visit and medication refill. He states that he's compliant with his medication, denies side effects and continues to make healthy lifestyle changes. He states that " I am feeling great , and am expecting my first grand son". He  denies any anxiety, lives at his family house, with his father since his divorce. He states that his mood is good, denies suicidal nor homicidal ideation. Overall, he states that he's doing well and offers no further complaint. PHQ-9 = 0 GAD-7= 0  Review of Systems  Constitutional: Negative.   Respiratory: Negative.    Cardiovascular: Negative.   Neurological: Negative.   Psychiatric/Behavioral: Negative.        Objective:     There were no vitals taken for this visit. BP Readings from Last 3 Encounters:  01/29/23 136/84  08/08/22 124/76  06/20/22 (!) 147/88   Wt Readings from Last 3 Encounters:  01/29/23 164 lb 3.2 oz (74.5 kg)  08/08/22 151 lb  12.8 oz (68.9 kg)  06/20/22 151 lb 14.4 oz (68.9 kg)      Physical Exam   No results found for any visits on 02/27/23.  Last CBC Lab Results  Component Value Date   WBC 11.6 (H) 04/24/2022   HGB 13.7 04/24/2022   HCT 42.7 04/24/2022   MCV 85.4 04/24/2022   MCH 27.4 04/24/2022   RDW 13.5 04/24/2022   PLT 258 04/24/2022   Last metabolic panel Lab Results  Component Value Date   GLUCOSE 78 04/24/2022   NA 142 04/24/2022   K 4.2 04/24/2022   CL 109 04/24/2022   CO2 20 (L) 04/24/2022   BUN 9 04/24/2022   CREATININE 0.82 04/24/2022   GFRNONAA >60 04/24/2022   CALCIUM 9.6 04/24/2022   PHOS 5.4 (H) 01/03/2022   PROT 8.2 (H) 04/24/2022   ALBUMIN 4.8 04/24/2022   LABGLOB 2.9 01/03/2022   AGRATIO 1.8 01/03/2022   BILITOT 0.5 04/24/2022   ALKPHOS 86 04/24/2022   AST 32 04/24/2022   ALT 20 04/24/2022   ANIONGAP 13 04/24/2022   Last lipids Lab Results  Component Value Date   CHOL 208 (H) 06/06/2022   HDL 65 06/06/2022   LDLCALC 113 (H) 06/06/2022   TRIG 175 (H) 06/06/2022   CHOLHDL 3.2 06/06/2022   Last hemoglobin A1c Lab Results  Component Value Date   HGBA1C 5.7 (H) 01/03/2022   Last thyroid functions No results found for: "TSH", "T3TOTAL", "T4TOTAL", "THYROIDAB" Last vitamin D Lab Results  Component Value Date   VD25OH 74.2 08/08/2022  Last vitamin B12 and Folate Lab Results  Component Value Date   VITAMINB12 1,137 01/03/2022   FOLATE >20.0 01/03/2022      The 10-year ASCVD risk score (Arnett DK, et al., 2019) is: 12.1%    Assessment & Plan:   1. Hypertension, unspecified type - He will continue current medication, DASH diet and exercise as tolerated. - amLODipine (NORVASC) 10 MG tablet; Take 1 tablet (10 mg total) by mouth once daily.  Dispense: 90 tablet; Refill: 1  2. Depression, major, single episode, moderate (HCC) - His depression is under control, he will continue current medication and call the Crisis help line with worsening symptoms.  Will consult with Dr Mare Ferrari. - citalopram (CELEXA) 10 MG tablet; Take 1 tablet (10 mg total) by mouth once daily.  Dispense: 30 tablet; Refill: 0 - mirtazapine (REMERON) 15 MG tablet; Take 1 tablet (15 mg total) by mouth at bedtime.  Dispense: 30 tablet; Refill: 0   Return in about 9 weeks (around 05/01/2023), or if symptoms worsen or fail to improve.    Platon Arocho Trellis Paganini, NP

## 2023-03-12 IMAGING — US US ABDOMEN LIMITED
1 series · 15 of 25 positions shown · non-contrast
Comparison: None.

CLINICAL DATA: Elevated liver function tests.

EXAM:
ULTRASOUND ABDOMEN LIMITED RIGHT UPPER QUADRANT

[Series 1: us abdomen limited ruq · 15 of 41 slices shown]
[im 1/41]
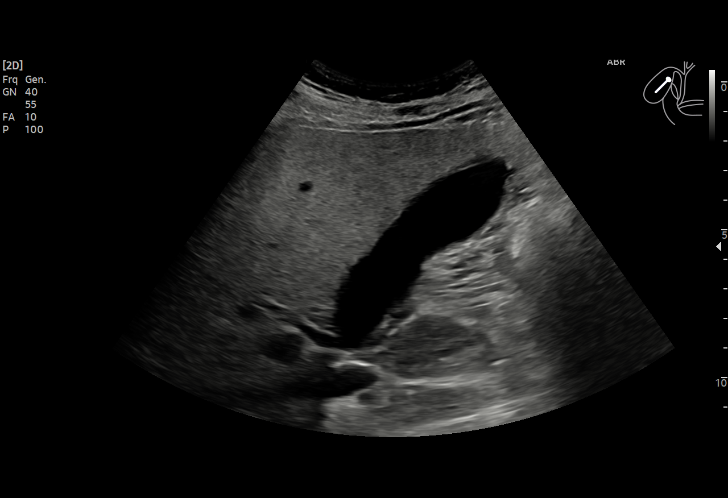
[im 4/41]
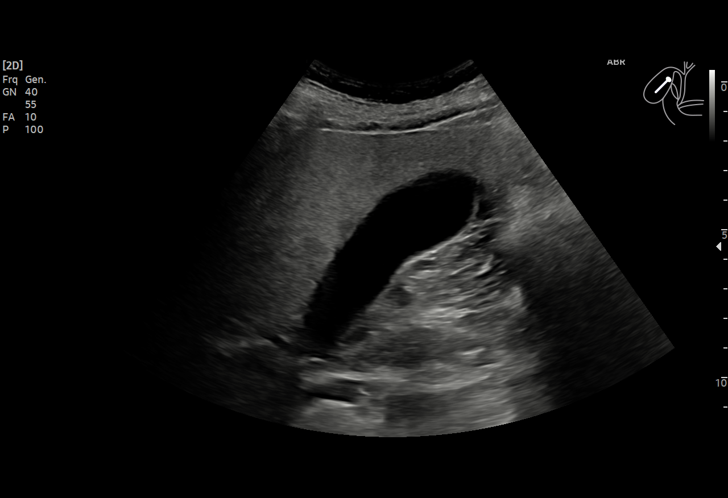
[im 7/41]
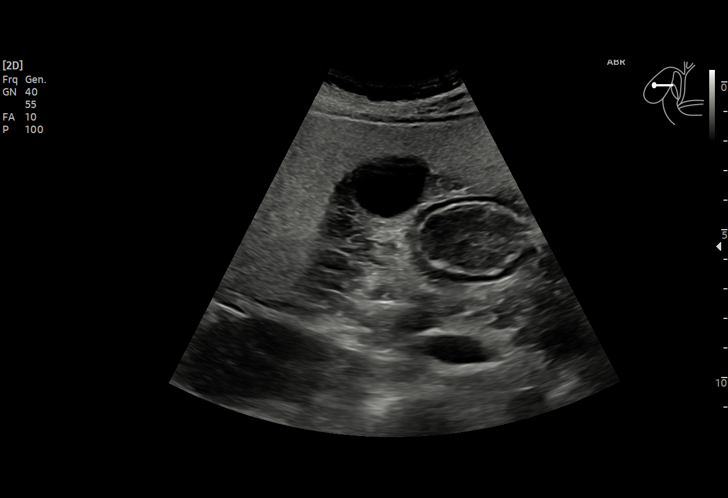
[im 9/41]
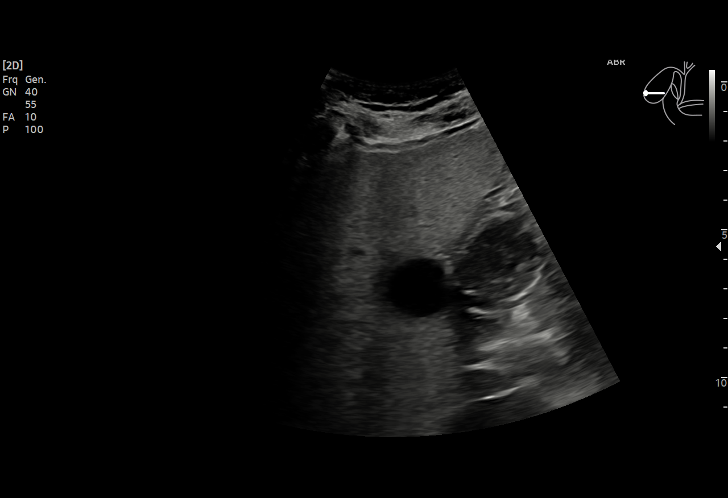
[im 12/41]
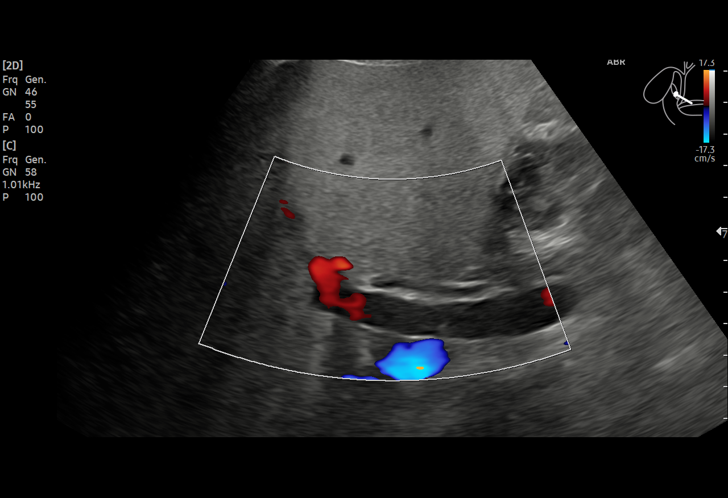
[im 16/41]
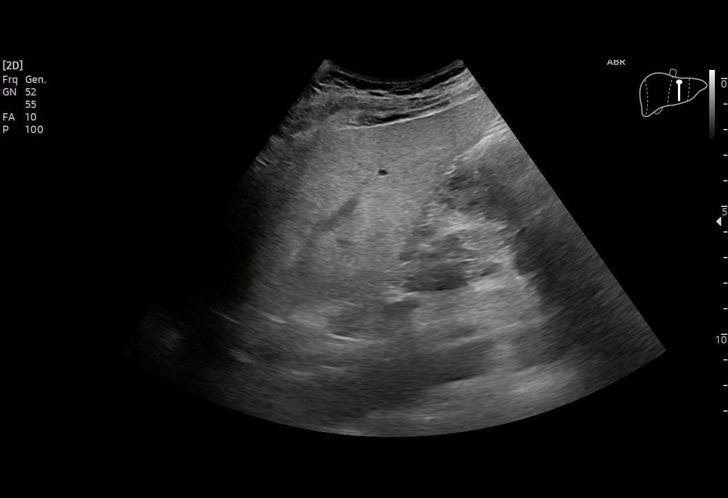
[im 17/41]
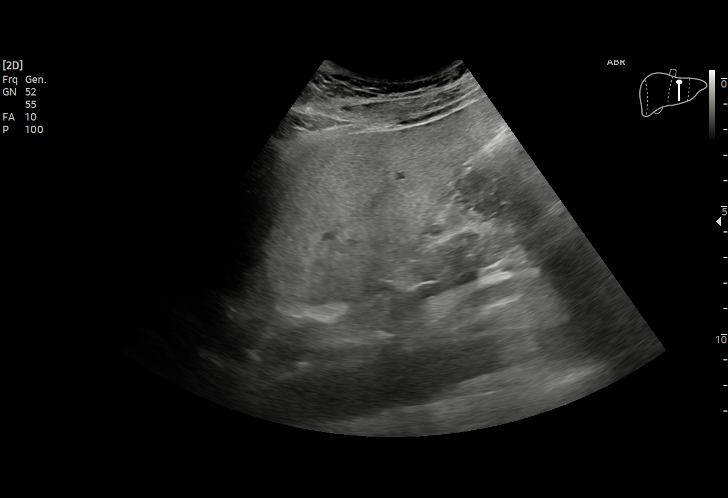
[im 21/41]
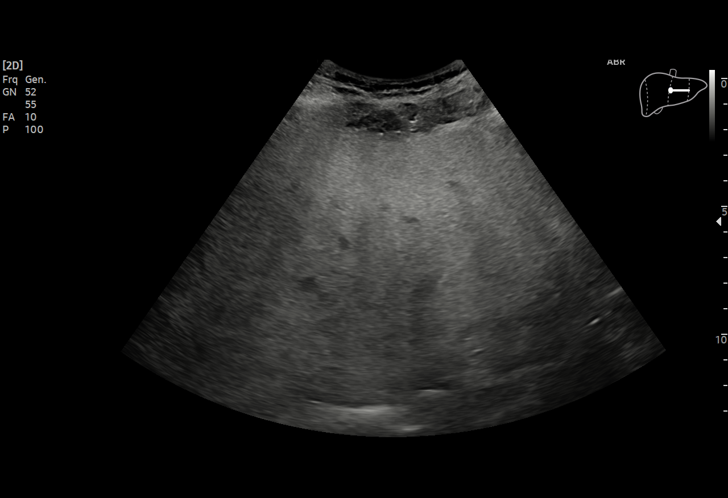
[im 24/41]
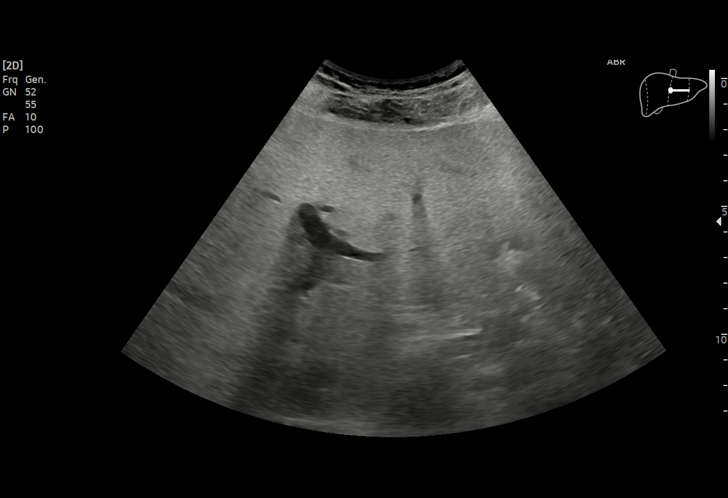
[im 26/41]
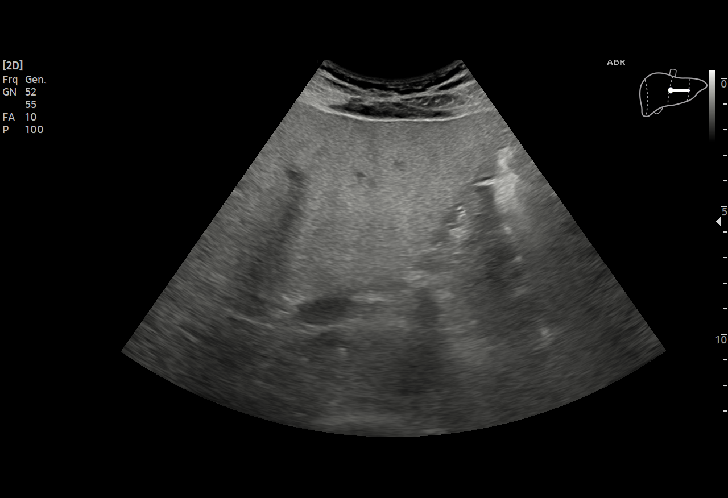
[im 29/41]
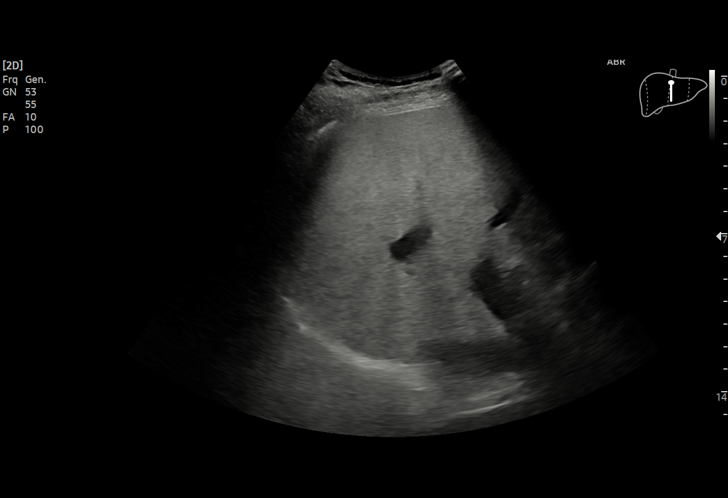
[im 32/41]
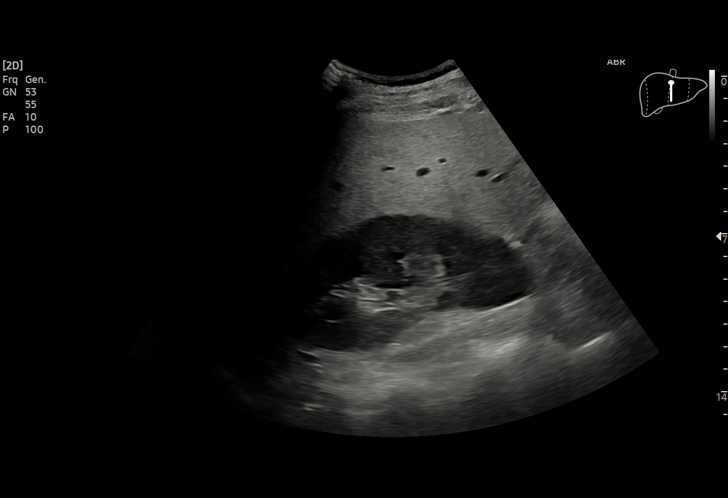
[im 34/41]
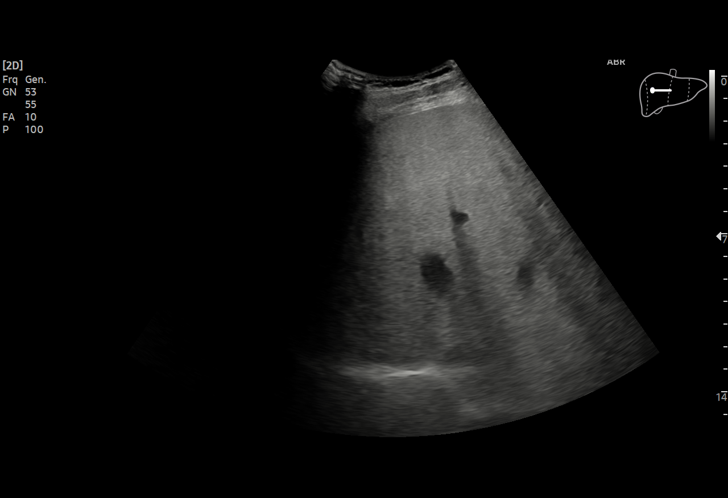
[im 37/41]
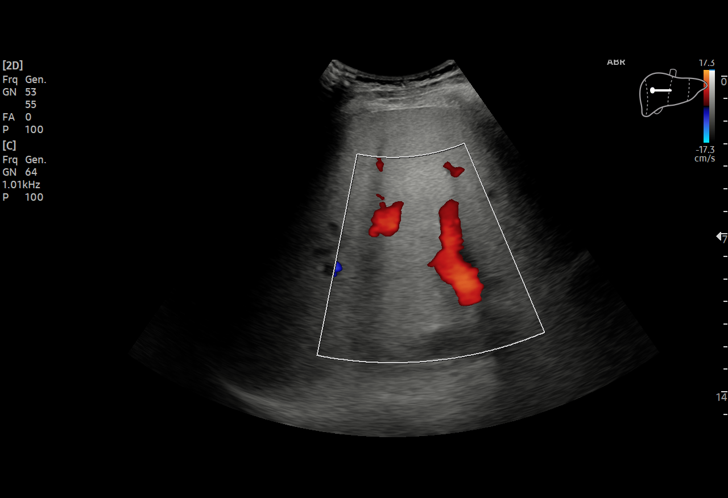
[im 41/41]
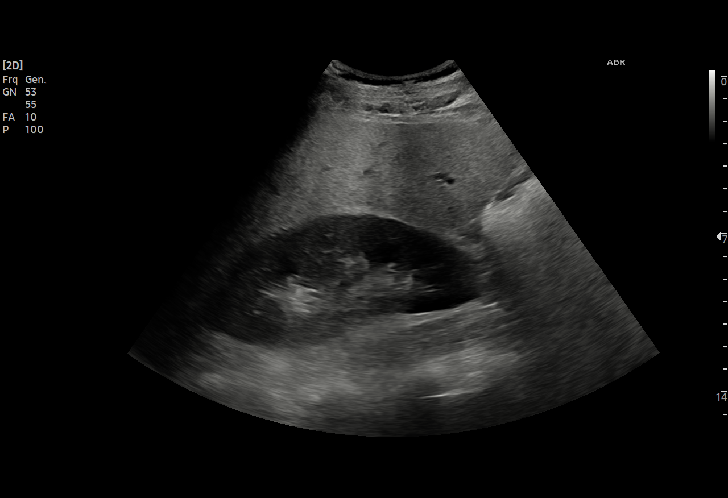

[15 of 25 positions shown; findings below may reference images not displayed]

FINDINGS: Gallbladder:

No gallstones or wall thickening visualized (1.8 mm). No sonographic
Murphy sign noted by sonographer.

Common bile duct:

Diameter: 3.8 mm

Liver:

No focal lesion identified. Diffusely increased echogenicity of the
liver parenchyma is noted. Portal vein is patent on color Doppler
imaging with normal direction of blood flow towards the liver.

Other: None.
IMPRESSION: Hepatic steatosis without focal liver lesions.

## 2023-04-01 ENCOUNTER — Other Ambulatory Visit: Payer: Self-pay | Admitting: Gerontology

## 2023-04-01 ENCOUNTER — Other Ambulatory Visit: Payer: Self-pay

## 2023-04-01 DIAGNOSIS — F321 Major depressive disorder, single episode, moderate: Secondary | ICD-10-CM

## 2023-04-03 ENCOUNTER — Other Ambulatory Visit: Payer: Self-pay | Admitting: Gerontology

## 2023-04-03 ENCOUNTER — Other Ambulatory Visit: Payer: Self-pay

## 2023-04-03 DIAGNOSIS — F321 Major depressive disorder, single episode, moderate: Secondary | ICD-10-CM

## 2023-04-07 ENCOUNTER — Other Ambulatory Visit: Payer: Self-pay

## 2023-04-08 ENCOUNTER — Other Ambulatory Visit: Payer: Self-pay

## 2023-04-08 MED FILL — Citalopram Hydrobromide Tab 10 MG (Base Equiv): ORAL | 30 days supply | Qty: 30 | Fill #0 | Status: CN

## 2023-04-10 ENCOUNTER — Other Ambulatory Visit: Payer: Self-pay

## 2023-04-18 ENCOUNTER — Other Ambulatory Visit: Payer: Self-pay

## 2023-04-23 ENCOUNTER — Other Ambulatory Visit: Payer: Self-pay

## 2023-04-24 ENCOUNTER — Other Ambulatory Visit: Payer: Self-pay

## 2023-04-24 ENCOUNTER — Other Ambulatory Visit: Payer: Self-pay | Admitting: Gerontology

## 2023-04-24 DIAGNOSIS — F321 Major depressive disorder, single episode, moderate: Secondary | ICD-10-CM

## 2023-04-24 MED ORDER — MIRTAZAPINE 15 MG PO TABS
15.0000 mg | ORAL_TABLET | Freq: Every day | ORAL | 0 refills | Status: AC
Start: 2023-04-24 — End: ?
  Filled 2023-04-24: qty 30, 30d supply, fill #0

## 2023-04-24 MED FILL — Citalopram Hydrobromide Tab 10 MG (Base Equiv): ORAL | 30 days supply | Qty: 30 | Fill #0 | Status: CN

## 2023-04-30 ENCOUNTER — Other Ambulatory Visit: Payer: Self-pay

## 2023-04-30 DIAGNOSIS — Z0000 Encounter for general adult medical examination without abnormal findings: Secondary | ICD-10-CM

## 2023-05-01 ENCOUNTER — Other Ambulatory Visit: Payer: Self-pay

## 2023-05-01 ENCOUNTER — Ambulatory Visit: Payer: Self-pay | Admitting: Gerontology

## 2023-05-01 ENCOUNTER — Encounter: Payer: Self-pay | Admitting: Gerontology

## 2023-05-01 VITALS — BP 120/79 | HR 99 | Ht 70.0 in | Wt 163.5 lb

## 2023-05-01 DIAGNOSIS — F321 Major depressive disorder, single episode, moderate: Secondary | ICD-10-CM

## 2023-05-01 DIAGNOSIS — E785 Hyperlipidemia, unspecified: Secondary | ICD-10-CM

## 2023-05-01 DIAGNOSIS — R7303 Prediabetes: Secondary | ICD-10-CM

## 2023-05-01 MED ORDER — MIRTAZAPINE 15 MG PO TABS
15.0000 mg | ORAL_TABLET | Freq: Every day | ORAL | 0 refills | Status: DC
Start: 2023-05-01 — End: 2023-06-10
  Filled 2023-05-01: qty 30, 30d supply, fill #0

## 2023-05-01 MED ORDER — CITALOPRAM HYDROBROMIDE 10 MG PO TABS
10.0000 mg | ORAL_TABLET | Freq: Every day | ORAL | 0 refills | Status: DC
Start: 2023-05-01 — End: 2023-06-10
  Filled 2023-05-01: qty 30, 30d supply, fill #0

## 2023-05-01 NOTE — Progress Notes (Signed)
Established Patient Office Visit  Subjective   Patient ID: Travis Leach, male    DOB: Jun 29, 1976  Age: 47 y.o. MRN: [id number]  Chief Complaint  Patient presents with   Follow-up    HPI  Travis Leach is a 47 y.o. male who has history of hypertension, smoking, asthma, alcohol use, depression, presents for routine follow-up with labs  reviews and medication refill. He states that he's compliant with his medication, denies side effects and continues to make healthy lifestyle changes. He smokes half a pack of cigarette in 2 days and drinks occasionally. He denies any asthmatic attacks recently.  His HgbA1c was 6% from the last result he denies any hypo/hyperglycemia and peripheral neuropathy.  His total cholesterol was 207 mg/dl and LDL increased from 113 to 409 mg/dl and the rest of his labs are unremarkable. He stated that his mood is good and denies any homicidal or suicidal ideation.  PQH -9 result was  6. He states that he is feeling down because he lost his job but started another one  recently, he eats twice a day and doesn't feel like eating. He also states that he does not fall asleep unless he takes his Remeron and he has been out of Remeron for 2 weeks and has not  been  sleeping well. GAD 7 scored 8, he is on edge  restless as a result job loss. Overall he stated that he is doing well and made no new compliant  PHQ-9 =6  Gad -7=8  Review of Systems  Constitutional: Negative.   HENT: Negative.    Eyes: Negative.   Respiratory: Negative.    Cardiovascular: Negative.   Genitourinary: Negative.   Skin: Negative.   Neurological: Negative.   Psychiatric/Behavioral: Negative.        Objective:     BP 120/79   Pulse 99   Ht 5\' 10"  (1.778 m)   Wt 163 lb 8 oz (74.2 kg)   SpO2 95%   BMI 23.46 kg/m  BP Readings from Last 3 Encounters:  05/01/23 120/79  01/29/23 136/84  08/08/22 124/76   Wt Readings from Last 3 Encounters:  05/01/23 163 lb 8 oz (74.2 kg)  01/29/23 164  lb 3.2 oz (74.5 kg)  08/08/22 151 lb 12.8 oz (68.9 kg)      Physical Exam HENT:     Head: Normocephalic and atraumatic.  Eyes:     Pupils: Pupils are equal, round, and reactive to light.  Cardiovascular:     Rate and Rhythm: Normal rate and regular rhythm.     Pulses: Normal pulses.     Heart sounds: Normal heart sounds.  Pulmonary:     Effort: Pulmonary effort is normal.     Breath sounds: Normal breath sounds.  Skin:    General: Skin is warm.  Neurological:     General: No focal deficit present.     Mental Status: He is alert and oriented to person, place, and time. Mental status is at baseline.  Psychiatric:        Mood and Affect: Mood normal.        Behavior: Behavior normal.        Thought Content: Thought content normal.        Judgment: Judgment normal.     Last CBC Lab Results  Component Value Date   WBC 8.3 04/30/2023   HGB 15.5 04/30/2023   HCT 46.4 04/30/2023   MCV 82 04/30/2023   MCH 27.4 04/30/2023  RDW 13.3 04/30/2023   PLT 198 04/30/2023   Last metabolic panel Lab Results  Component Value Date   GLUCOSE 108 (H) 04/30/2023   NA 136 04/30/2023   K 4.4 04/30/2023   CL 98 04/30/2023   CO2 20 04/30/2023   BUN 10 04/30/2023   CREATININE 0.83 04/30/2023   EGFR 110 04/30/2023   CALCIUM 9.5 04/30/2023   PHOS 5.4 (H) 01/03/2022   PROT 7.8 04/30/2023   ALBUMIN 4.9 04/30/2023   LABGLOB 2.9 04/30/2023   AGRATIO 1.8 01/03/2022   BILITOT 0.7 04/30/2023   ALKPHOS 135 (H) 04/30/2023   AST 35 04/30/2023   ALT 22 04/30/2023   ANIONGAP 13 04/24/2022      The 10-year ASCVD risk score (Arnett DK, et al., 2019) is: 9.7%    Assessment & Plan:  1. Depression, major, single episode, moderate (HCC) His depression is not under control, he ran out of Remeron for 2 weeks  and stated feeling down. He will continue on Remeron and Celexa.He was advised to call the Crisis help line with worsening symptoms. - citalopram (CELEXA) 10 MG tablet; Take 1 tablet (10  mg total) by mouth once daily.   - mirtazapine (REMERON) 15 MG tablet; Take 1 tablet (15 mg total) by mouth at bedtime.   2. Elevated lipids -The 10-year ASCVD risk score (Arnett DK, et al., 2019) is: 9.7%   Values used to calculate the score:     Age: 64 years     Sex: Male     Is Non-Hispanic African American: Yes     Diabetic: No     Tobacco smoker: Yes     Systolic Blood Pressure: 120 mmHg     Is BP treated: Yes     HDL Cholesterol: 63 mg/dL     Total Cholesterol: 207 mg/dL  He will continue on current medication, low fat/cholesterol diet and exercise as tolerated.  3. Prediabetes His HgbA1c was 6%, he declined Metformin therapy, was advised to continue on low carb / non concentrated sweet diet and  exercise as tolerated and to report to clinic with any signs and symptoms of hyper/ hypoglycemia.   4. Hypertension, unspecified type  His blood pressure is under control. - He will continue on the current medication, Dash diet and exercise as tolerated.  -Amlodipine 10 mg one tab daily.   He will follow up in 4 weeks around 05/29/23 via Telemed, or if symptoms worse or fail to improve     Mickie Hillier, FNP

## 2023-05-01 NOTE — Patient Instructions (Signed)
Carbachol Eye Injection What is this medication? CARBACHOL (kahr buh kawl) prevents and treats increased pressure of the eye after an eye procedure. It works by relaxing the muscles in the eye, which helps lower eye pressure. This medicine may be used for other purposes; ask your health care provider or pharmacist if you have questions. COMMON BRAND NAME(S): Carbastat, Miostat What should I tell my care team before I take this medication? They need to know if you have any of these conditions: Difficulty urinating Heart disease Lung or breathing disease, like asthma Parkinson disease Stomach or intestine problems Thyroid disease An unusual or allergic reaction to carbachol, other medications, foods, dyes, or preservatives Breast-feeding Pregnant or trying to get pregnant How should I use this medication? This does not apply. This medication will be given by your care team in a hospital or clinic setting. Overdosage: If you think you have taken too much of this medicine contact a poison control center or emergency room at once. NOTE: This medicine is only for you. Do not share this medicine with others. What if I miss a dose? This does not apply. What may interact with this medication? Cyclopentolate This list may not describe all possible interactions. Give your health care provider a list of all the medicines, herbs, non-prescription drugs, or dietary supplements you use. Also tell them if you smoke, drink alcohol, or use illegal drugs. Some items may interact with your medicine. What should I watch for while using this medication? Tell your care team if your symptoms do not start to get better or if they get worse. This medication can make your vision blurry. You may find it is difficult to see, especially at night. Do not drive, use machinery, or do anything that requires clear vision until you know how this medication affects you. If you wear contact lenses, ask your care team when you  can use your lenses again. What side effects may I notice from receiving this medication? Side effects that you should report to your care team as soon as possible: Allergic reactions--skin rash, itching, hives, swelling of the face, lips, tongue, or throat New or worsening eye pain, redness, irritation, or discharge Side effects that usually do not require medical attention (report to your care team if they continue or are bothersome): Blurry vision Flushing Headache Stomach pain Sweating This list may not describe all possible side effects. Call your doctor for medical advice about side effects. You may report side effects to FDA at 1-800-FDA-1088. Where should I keep my medication? This medication is given in a hospital or clinic and will not be stored at home. NOTE: This sheet is a summary. It may not cover all possible information. If you have questions about this medicine, talk to your doctor, pharmacist, or health care provider.  2024 Elsevier/Gold Standard (2021-09-13 00:00:00)

## 2023-05-24 ENCOUNTER — Emergency Department (HOSPITAL_COMMUNITY): Payer: Self-pay

## 2023-05-24 ENCOUNTER — Emergency Department (HOSPITAL_COMMUNITY)
Admission: EM | Admit: 2023-05-24 | Discharge: 2023-05-24 | Disposition: A | Discharge status: Home / Self Care | Payer: Self-pay | Attending: Emergency Medicine | Admitting: Emergency Medicine

## 2023-05-24 ENCOUNTER — Other Ambulatory Visit: Payer: Self-pay

## 2023-05-24 ENCOUNTER — Encounter (HOSPITAL_COMMUNITY): Payer: Self-pay

## 2023-05-24 DIAGNOSIS — R200 Anesthesia of skin: Secondary | ICD-10-CM | POA: Insufficient documentation

## 2023-05-24 DIAGNOSIS — R531 Weakness: Secondary | ICD-10-CM | POA: Insufficient documentation

## 2023-05-24 DIAGNOSIS — R519 Headache, unspecified: Secondary | ICD-10-CM | POA: Insufficient documentation

## 2023-05-24 LAB — URINALYSIS, ROUTINE W REFLEX MICROSCOPIC
Bacteria, UA: NONE SEEN
Bilirubin Urine: NEGATIVE
Glucose, UA: NEGATIVE mg/dL
Hgb urine dipstick: NEGATIVE
Ketones, ur: 5 mg/dL — AB
Leukocytes,Ua: NEGATIVE
Nitrite: NEGATIVE
Protein, ur: 100 mg/dL — AB
Specific Gravity, Urine: 1.032 — ABNORMAL HIGH (ref 1.005–1.030)
pH: 5 (ref 5.0–8.0)

## 2023-05-24 LAB — I-STAT CHEM 8, ED
BUN: 8 mg/dL (ref 6–20)
Calcium, Ion: 0.99 mmol/L — ABNORMAL LOW (ref 1.15–1.40)
Chloride: 104 mmol/L (ref 98–111)
Creatinine, Ser: 1 mg/dL (ref 0.61–1.24)
Glucose, Bld: 119 mg/dL — ABNORMAL HIGH (ref 70–99)
HCT: 47 % (ref 39.0–52.0)
Hemoglobin: 16 g/dL (ref 13.0–17.0)
Potassium: 3.6 mmol/L (ref 3.5–5.1)
Sodium: 139 mmol/L (ref 135–145)
TCO2: 19 mmol/L — ABNORMAL LOW (ref 22–32)

## 2023-05-24 LAB — CBC
HCT: 44 % (ref 39.0–52.0)
Hemoglobin: 14.5 g/dL (ref 13.0–17.0)
MCH: 27.6 pg (ref 26.0–34.0)
MCHC: 33 g/dL (ref 30.0–36.0)
MCV: 83.8 fL (ref 80.0–100.0)
Platelets: 246 10*3/uL (ref 150–400)
RBC: 5.25 MIL/uL (ref 4.22–5.81)
RDW: 13.6 % (ref 11.5–15.5)
WBC: 8.8 10*3/uL (ref 4.0–10.5)
nRBC: 0 % (ref 0.0–0.2)

## 2023-05-24 LAB — RAPID URINE DRUG SCREEN, HOSP PERFORMED
Amphetamines: NOT DETECTED
Barbiturates: NOT DETECTED
Benzodiazepines: NOT DETECTED
Cocaine: NOT DETECTED
Opiates: NOT DETECTED
Tetrahydrocannabinol: POSITIVE — AB

## 2023-05-24 LAB — COMPREHENSIVE METABOLIC PANEL
ALT: 74 U/L — ABNORMAL HIGH (ref 0–44)
AST: 91 U/L — ABNORMAL HIGH (ref 15–41)
Albumin: 4.6 g/dL (ref 3.5–5.0)
Alkaline Phosphatase: 110 U/L (ref 38–126)
Anion gap: 16 — ABNORMAL HIGH (ref 5–15)
BUN: 9 mg/dL (ref 6–20)
CO2: 19 mmol/L — ABNORMAL LOW (ref 22–32)
Calcium: 8.8 mg/dL — ABNORMAL LOW (ref 8.9–10.3)
Chloride: 101 mmol/L (ref 98–111)
Creatinine, Ser: 0.72 mg/dL (ref 0.61–1.24)
GFR, Estimated: >60 mL/min (ref >60)
Glucose, Bld: 118 mg/dL — ABNORMAL HIGH (ref 70–99)
Potassium: 3.5 mmol/L (ref 3.5–5.1)
Sodium: 136 mmol/L (ref 135–145)
Total Bilirubin: 0.5 mg/dL (ref 0.3–1.2)
Total Protein: 8.8 g/dL — ABNORMAL HIGH (ref 6.5–8.1)

## 2023-05-24 LAB — DIFFERENTIAL
Abs Immature Granulocytes: 0.03 10*3/uL (ref 0.00–0.07)
Basophils Absolute: 0.1 10*3/uL (ref 0.0–0.1)
Basophils Relative: 1 %
Eosinophils Absolute: 0.1 10*3/uL (ref 0.0–0.5)
Eosinophils Relative: 1 %
Immature Granulocytes: 0 %
Lymphocytes Relative: 27 %
Lymphs Abs: 2.4 10*3/uL (ref 0.7–4.0)
Monocytes Absolute: 0.7 10*3/uL (ref 0.1–1.0)
Monocytes Relative: 7 %
Neutro Abs: 5.6 10*3/uL (ref 1.7–7.7)
Neutrophils Relative %: 64 %

## 2023-05-24 LAB — CBG MONITORING, ED: Glucose-Capillary: 123 mg/dL — ABNORMAL HIGH (ref 70–99)

## 2023-05-24 LAB — APTT: aPTT: 22 seconds — ABNORMAL LOW (ref 24–36)

## 2023-05-24 LAB — ETHANOL: Alcohol, Ethyl (B): 230 mg/dL — ABNORMAL HIGH (ref <10)

## 2023-05-24 LAB — PROTIME-INR
INR: 0.9 (ref 0.8–1.2)
Prothrombin Time: 12.7 seconds (ref 11.4–15.2)

## 2023-05-24 MED ORDER — IOHEXOL 350 MG/ML SOLN
100.0000 mL | Freq: Once | INTRAVENOUS | Status: AC | PRN
  Administered 2023-05-24: 100 mL via INTRAVENOUS

## 2023-05-24 NOTE — ED Notes (Signed)
Pt reports waking up around 5AM after going to bed between 10-12 with "difficulty walking and somewhat slurred speech."  Pt reports then around 10AM noted headache and SHOB with worsening weakness, numbness and tingling of his left side.  Pt reports hx of anxiety.  Upon arrival back to room from CT, pt reports that the numbness and tingling had gotten better and he is overall feeling better at this time.

## 2023-05-24 NOTE — ED Notes (Signed)
Pt returned to room after CT, tele neurology remains on-screen with further evaluation of pt.

## 2023-05-24 NOTE — Consult Note (Signed)
[facility] Telemedicine Consult   Requesting Provider: Melene Plan Consult Participants: Myself, Triage nurse Lanice Schwab, ED nurse Victorino Dike, [facility] Nurse York Pellant of the provider: [facility] Location of the patient: [facility]   This consult was provided via telemedicine with 2-way video and audio communication. The patient/family was informed that care would be provided in this way and agreed to receive care in this manner.    Chief Complaint: Headache, vision change, speech difficulty, left sided weakness and numbness   HPI: This is a 47 year old man with a past medical history significant for polysubstance abuse (cocaine, alcohol, tobacco), prediabetes, hyperlipidemia, hypertension, anxiety/depression  He reports he went to bed in his usual state of health last night somewhere between 10 PM and midnight most likely around 11 PM.  When he woke at 5 this morning he was feeling short of breath and having some difficulty with his speech as well as imbalance and a slight headache.  At 10 AM began to have a severe headache and noticed some vision changes as well which prompted presentation to the ED  LKW: 11 PM 8/23 (symptom discovery at 5 AM) Thrombolytic given?: No, out of the window IR Thrombectomy? No, no LVO Modified Rankin Scale: 0 Time of teleneurologist evaluation: 12:08 PM  Exam: Vitals:   05/24/23 1204  BP: (!) 165/94  Pulse: (!) 113  Resp: 19  Temp: 98.2 F (36.8 C)  SpO2: 99%    General: Mildly anxious, cooperative Pulmonary: breathing comfortably Cardiac: regular rate and rhythm on monitor, sinus tachycardia  NIH Stroke scale 1A: Level of Consciousness - 0 1B: Ask Month and Age - 0 1C: 'Blink Eyes' & 'Squeeze Hands' - 0 2: Test Horizontal Extraocular Movements - 0 3: Test Visual Fields - 1 possible LUQ > LLQ difficulty seeing 4: Test Facial Palsy - 0 5A: Test Left Arm Motor Drift - 1 inconsistent intermittent loss of tone possible asterixis 5B: Test  Right Arm Motor Drift - 1 6A: Test Left Leg Motor Drift - 2 6B: Test Right Leg Motor Drift - 1 7: Test Limb Ataxia - 0 8: Test Sensation - 1 reduced sensation in the left face arm and leg 9: Test Language/Aphasia- 0 10: Test Dysarthria - 1 11: Test Extinction/Inattention - 0 NIHSS score: 8    Imaging Reviewed:   Head CT personally reviewed, no acute intracranial process  CTA personally reviewed, no LVO  Labs reviewed in epic and pertinent values follow:  Basic Metabolic Panel: Recent Labs  Lab 05/24/23 1210 05/24/23 1216  NA 136 139  K 3.5 3.6  CL 101 104  CO2 19*  --   GLUCOSE 118* 119*  BUN 9 8  CREATININE 0.72 1.00  CALCIUM 8.8*  --     CBC: Recent Labs  Lab 05/24/23 1210 05/24/23 1216  WBC 8.8  --   NEUTROABS 5.6  --   HGB 14.5 16.0  HCT 44.0 47.0  MCV 83.8  --   PLT 246  --     Coagulation Studies: Recent Labs    05/24/23 1210  LABPROT 12.7  INR 0.9    CTA personally reviewed, agree with radiology:   1. No emergent finding. 2. Fairly focal moderate stenosis in the left P2/3. 3. Calcified plaque in the carotids without flow reducing stenosis or ulceration. 4. Small airspace disease in the left lower lobe, correlate for pneumonia symptoms.   CT perfusion personally reviewed, agree with radiology:   Likely noncontributory, delayed perfusion in the bifrontal white matter has  no CTA or clinical correlate.   Assessment: Possible right PCA or MCA territory infarct versus functional neurologic disorder  Recommendations:  -CTA/CT perfusion to assess for LVO and potential for intervenable lesion given last known well greater than 6 hours on my recommendation, negative for actionable lesion -MRI brain to assess more sensitively for stroke -If MRI brain is positive please admit for stroke workup (echocardiogram, A1c, lipid panel, telemetry, PT/OT/SLP, initiate DAPT with 300 mg Plavix followed by 75 mg daily for 21 days, aspirin 81 mg indefinitely) -If  MRI brain is negative, continued outpatient psychiatry follow-up and consider referral to neurology for headache management -Appreciate medical clearance per ED team for shortness of breath and other symptoms   This patient is receiving care for possible acute neurological changes. There was 60 minutes of care by this provider at the time of service, including time for direct evaluation via telemedicine, review of medical records, imaging studies and discussion of findings with providers, the patient and/or family.  Brooke Dare MD-PhD [facility] [phone]   If 8pm-8am, please page neurology on call as listed in AMION.  CRITICAL CARE Performed by: Gordy Councilman   Total critical care time: 60 minutes  Critical care time was exclusive of separately billable procedures and treating other patients.  Critical care was necessary to treat or prevent imminent or life-threatening deterioration.  Critical care was time spent personally by me on the following activities: development of treatment plan with patient and/or surrogate as well as nursing, discussions with consultants, evaluation of patient's response to treatment, examination of patient, obtaining history from patient or surrogate, ordering and performing treatments and interventions, ordering and review of laboratory studies, ordering and review of radiographic studies, pulse oximetry and re-evaluation of patient's condition.

## 2023-05-24 NOTE — ED Notes (Signed)
Patient reports to neuro that he woke up not feeling well and that he last felt well last night

## 2023-05-24 NOTE — ED Triage Notes (Addendum)
Patient c/o headache, left side numbness, vision changes starting at 10 am today Left side weakness, decreased strength in left side  Pain 10/10 headache  Last known well 10am Reports history of htn and anxiety

## 2023-05-24 NOTE — ED Provider Notes (Signed)
[place] EMERGENCY DEPARTMENT AT [facility] Provider Note   CSN: [id number] Arrival date & time: 05/24/23  1200  An emergency department physician performed an initial assessment on this suspected stroke patient at 1215.  History  Chief Complaint  Patient presents with   Numbness    Travis Leach is a 47 y.o. male.  47 yo M with a chief complaints of left-sided weakness.  The patient was normal until about 2 hours ago.  Had a sudden onset headache and left-sided weakness and difficulty with balance.        Home Medications Prior to Admission medications   Medication Sig Start Date End Date Taking? Authorizing Provider  albuterol (VENTOLIN HFA) 108 (90 Base) MCG/ACT inhaler Inhale 2 puffs into the lungs every 6 (six) hours as needed for wheezing or shortness of breath. 04/23/23   Odette Fraction, NP  amLODipine (NORVASC) 10 MG tablet Take 1 tablet (10 mg total) by mouth once daily. 02/27/23   Iloabachie, Chioma E, NP  citalopram (CELEXA) 10 MG tablet Take 1 tablet (10 mg total) by mouth once daily. 05/01/23   Sonny-Echendu, Marlana Salvage, FNP  folic acid (FOLVITE) 1 MG tablet Take 1 tablet (1 mg total) by mouth once daily. 01/29/23   Odette Fraction, NP  mirtazapine (REMERON) 15 MG tablet Take 1 tablet (15 mg total) by mouth at bedtime. 05/01/23   Sonny-Echendu, Marlana Salvage, FNP  pravastatin (PRAVACHOL) 10 MG tablet Take 1 tablet (10 mg total) by mouth once daily. 01/29/23   Odette Fraction, NP  thiamine (VITAMIN B1) 100 MG tablet Take 1 tablet (100 mg total) by mouth daily. 04/23/23   Odette Fraction, NP      Allergies    Patient has no known allergies.    Review of Systems   Review of Systems  Physical Exam Updated Vital Signs BP (!) 170/99   Pulse 99   Temp 98.2 F (36.8 C) (Oral)   Resp 15   Ht 5\' 10"  (1.778 m)   Wt 74.8 kg   SpO2 95%   BMI 23.68 kg/m  Physical Exam Vitals and nursing note reviewed.  Constitutional:      Appearance: He is well-developed.  HENT:     Head: Normocephalic and  atraumatic.  Eyes:     Pupils: Pupils are equal, round, and reactive to light.  Neck:     Vascular: No JVD.  Cardiovascular:     Rate and Rhythm: Normal rate and regular rhythm.     Heart sounds: No murmur heard.    No friction rub. No gallop.  Pulmonary:     Effort: No respiratory distress.     Breath sounds: No wheezing.  Abdominal:     General: There is no distension.     Tenderness: There is no abdominal tenderness. There is no guarding or rebound.  Musculoskeletal:        General: Normal range of motion.     Cervical back: Normal range of motion and neck supple.  Skin:    Coloration: Skin is not pale.     Findings: No rash.  Neurological:     Mental Status: He is alert and oriented to person, place, and time.     Comments: Left-sided weakness upper greater than lower.  Psychiatric:        Behavior: Behavior normal.     ED Results / Procedures / Treatments   Labs (all labs ordered are listed, but only abnormal results are displayed) Labs Reviewed  ETHANOL - Abnormal; Notable for the following components:      Result Value   Alcohol, Ethyl (B) 230 (*)    All other components within normal limits  APTT - Abnormal; Notable for the following components:   aPTT 22 (*)    All other components within normal limits  COMPREHENSIVE METABOLIC PANEL - Abnormal; Notable for the following components:   CO2 19 (*)    Glucose, Bld 118 (*)    Calcium 8.8 (*)    Total Protein 8.8 (*)    AST 91 (*)    ALT 74 (*)    Anion gap 16 (*)    All other components within normal limits  RAPID URINE DRUG SCREEN, HOSP PERFORMED - Abnormal; Notable for the following components:   Tetrahydrocannabinol POSITIVE (*)    All other components within normal limits  URINALYSIS, ROUTINE W REFLEX MICROSCOPIC - Abnormal; Notable for the following components:   Specific Gravity, Urine 1.032 (*)    Ketones, ur 5 (*)    Protein, ur 100 (*)    All other components within normal limits  CBG MONITORING,  ED - Abnormal; Notable for the following components:   Glucose-Capillary 123 (*)    All other components within normal limits  I-STAT CHEM 8, ED - Abnormal; Notable for the following components:   Glucose, Bld 119 (*)    Calcium, Ion 0.99 (*)    TCO2 19 (*)    All other components within normal limits  PROTIME-INR  CBC  DIFFERENTIAL    EKG EKG Interpretation Date/Time:  Saturday May 24 2023 12:08:36 EDT Ventricular Rate:  106 PR Interval:  133 QRS Duration:  98 QT Interval:  337 QTC Calculation: 448 R Axis:   81  Text Interpretation: Sinus tachycardia Since last tracing rate faster Otherwise no significant change Confirmed by Melene Plan [phone]) on 05/24/2023 12:22:12 PM  Radiology MR BRAIN WO CONTRAST  Result Date: 05/24/2023 CLINICAL DATA:  Provided history: Neuro deficit, acute, stroke suspected. Additional history obtained from electronic MEDICAL RECORD NUMBERHeadache. Vision changes. Speech difficulty. Left-sided weakness and numbness. EXAM: MRI HEAD WITHOUT CONTRAST TECHNIQUE: Multiplanar, multiecho pulse sequences of the brain and surrounding structures were obtained without intravenous contrast. COMPARISON:  Non-contrast head CT and CT angiogram head/neck 05/24/2023. FINDINGS: Mild intermittent motion degradation. Brain: No age advanced or lobar predominant parenchymal atrophy. There are a few tiny nonspecific T2 FLAIR hyperintense chronic insults scattered within the bilateral cerebral white matter. There is no acute infarct. No evidence of an intracranial mass. No chronic intracranial blood products. No extra-axial fluid collection. No midline shift. Vascular: Maintained flow voids within the proximal large arterial vessels. Skull and upper cervical spine: No focal suspicious marrow lesion. Sinuses/Orbits: No mass or acute finding within the imaged orbits. No significant paranasal sinus disease. IMPRESSION: 1. Mildly motion degraded exam 2.  No evidence of an acute intracranial  abnormality. 3. There are a few tiny nonspecific chronic insults within the cerebral white matter. Electronically Signed   By: Jackey Loge D.O.   On: 05/24/2023 14:27   CT ANGIO HEAD NECK W WO CM W PERF (CODE STROKE)  Result Date: 05/24/2023 CLINICAL DATA:  Neuro deficit with acute stroke suspected. Possible left hemianopia. EXAM: CT ANGIOGRAPHY HEAD AND NECK CT PERFUSION BRAIN TECHNIQUE: Multidetector CT imaging of the head and neck was performed using the standard protocol during bolus administration of intravenous contrast. Multiplanar CT image reconstructions and MIPs were obtained to evaluate the vascular anatomy. Carotid stenosis measurements (when applicable)  are obtained utilizing NASCET criteria, using the distal internal carotid diameter as the denominator. Multiphase CT imaging of the brain was performed following IV bolus contrast injection. Subsequent parametric perfusion maps were calculated using RAPID software. RADIATION DOSE REDUCTION: This exam was performed according to the departmental dose-optimization program which includes automated exposure control, adjustment of the mA and/or kV according to patient size and/or use of iterative reconstruction technique. CONTRAST:  Head CT from earlier today COMPARISON:  Head CT from earlier today FINDINGS: CTA NECK FINDINGS Aortic arch: 3 vessel branching. Right carotid system: Low-density atheromatous wall thickening of the common carotid and proximal ICA. No stenosis or ulceration. Left carotid system: Low-density atheromatous wall thickening of the common carotid. Mixed density plaque at the bifurcation. No stenosis or ulceration. Vertebral arteries: No subclavian or vertebral stenosis. The right vertebral artery is dominant. Skeleton: No acute finding Other neck: No acute finding Upper chest: Subpleural opacity in the superior segment of the left lower lobe. Adjacent pulmonary arteries are enhancing. No abnormality in this area on 2023 chest CT.  Review of the MIP images confirms the above findings CTA HEAD FINDINGS Anterior circulation: Atheromatous calcification of the carotid siphons, notable for age. No branch occlusion, beading, or aneurysm. Negative for vascular malformation or proximal flow reducing stenosis. Posterior circulation: The vertebral arteries and basilar artery are smoothly contoured and diffusely patent. No branch occlusion, beading, or aneurysm. Moderate narrowing at the left P2/3 segment. Venous sinuses: Diffusely patent Anatomic variants: None significant Review of the MIP images confirms the above findings CT Brain Perfusion Findings: ASPECTS: 10 CBF (<30%) Volume: 0mL Perfusion (Tmax>6.0s) volume: 29mL the localizing to bifrontal deep white matter and with no correlate on CTA. IMPRESSION: CTA: 1. No emergent finding. 2. Fairly focal moderate stenosis in the left P2/3. 3. Calcified plaque in the carotids without flow reducing stenosis or ulceration. 4. Small airspace disease in the left lower lobe, correlate for pneumonia symptoms. CT perfusion: Likely noncontributory, delayed perfusion in the bifrontal white matter has no CTA or clinical correlate. Electronically Signed   By: Tiburcio Pea M.D.   On: 05/24/2023 12:54   CT HEAD CODE STROKE WO CONTRAST  Result Date: 05/24/2023 CLINICAL DATA:  Code stroke. Headache with left-sided numbness and vision changes starting at 10 a.m. today. EXAM: CT HEAD WITHOUT CONTRAST TECHNIQUE: Contiguous axial images were obtained from the base of the skull through the vertex without intravenous contrast. RADIATION DOSE REDUCTION: This exam was performed according to the departmental dose-optimization program which includes automated exposure control, adjustment of the mA and/or kV according to patient size and/or use of iterative reconstruction technique. COMPARISON:  04/24/2022 FINDINGS: Brain: No evidence of acute infarction, hemorrhage, hydrocephalus, extra-axial collection or mass lesion/mass  effect. Vascular: No hyperdense vessel or unexpected calcification. Skull: Normal. Negative for fracture or focal lesion. Sinuses/Orbits: No acute finding. Other: None. ASPECTS [facility] Stroke Program Early CT Score) - Ganglionic level infarction (caudate, lentiform nuclei, internal capsule, insula, M1-M3 cortex): 7 - Supraganglionic infarction (M4-M6 cortex): 3 Total score (0-10 with 10 being normal): 10 IMPRESSION: 1. Normal head CT. 2. ASPECTS is 10. Electronically Signed   By: Tiburcio Pea M.D.   On: 05/24/2023 12:31    Procedures Procedures    Medications Ordered in ED Medications  iohexol (OMNIPAQUE) 350 MG/ML injection 100 mL (100 mLs Intravenous Contrast Given 05/24/23 1256)    ED Course/ Medical Decision Making/ A&P  Medical Decision Making Amount and/or Complexity of Data Reviewed Labs: ordered. Radiology: ordered.  Risk Prescription drug management.   47 yo M with a chief complaints of sudden onset headache and left-sided weakness.  This started about 2 hours ago.  Patient is weak on the left side.  He was made a code stroke.  Seen by teleneuro. Dr. Tempie Hoist, recommends MRI. If negative felt ok to go home if positive recommends admission.   MRI negative.  D/c home.  PCP follow up.   2:31 PM:  I have discussed the diagnosis/risks/treatment options with the patient.  Evaluation and diagnostic testing in the emergency department does not suggest an emergent condition requiring admission or immediate intervention beyond what has been performed at this time.  They will follow up with PCP. We also discussed returning to the ED immediately if new or worsening sx occur. We discussed the sx which are most concerning (e.g., sudden worsening pain, fever, inability to tolerate by mouth) that necessitate immediate return. Medications administered to the patient during their visit and any new prescriptions provided to the patient are listed  below.  Medications given during this visit Medications  iohexol (OMNIPAQUE) 350 MG/ML injection 100 mL (100 mLs Intravenous Contrast Given 05/24/23 1256)     The patient appears reasonably screen and/or stabilized for discharge and I doubt any other medical condition or other [facility] requiring further screening, evaluation, or treatment in the ED at this time prior to discharge.          Final Clinical Impression(s) / ED Diagnoses Final diagnoses:  Left-sided weakness    Rx / DC Orders ED Discharge Orders     None         Melene Plan, DO 05/24/23 1431

## 2023-05-24 NOTE — Progress Notes (Signed)
1207 Stroke cart alert. 1207 [facility] paged by [facility]. 1208 Dr Brooke Dare joins the telemedicine cart. 1212 Patient to CT department, telemedicine cart taken to CT department with patient. 1241 Patient back in ED room, telemedicine cart remains with patient.

## 2023-05-24 NOTE — Discharge Instructions (Signed)
Follow up with your family doc in the office.  

## 2023-05-28 ENCOUNTER — Ambulatory Visit: Payer: Self-pay | Admitting: Gerontology

## 2023-06-05 ENCOUNTER — Ambulatory Visit: Payer: Self-pay | Admitting: Gerontology

## 2023-06-10 ENCOUNTER — Other Ambulatory Visit: Payer: Self-pay | Admitting: Gerontology

## 2023-06-10 ENCOUNTER — Ambulatory Visit: Payer: Self-pay | Admitting: Gerontology

## 2023-06-10 ENCOUNTER — Other Ambulatory Visit: Payer: Self-pay

## 2023-06-10 DIAGNOSIS — F321 Major depressive disorder, single episode, moderate: Secondary | ICD-10-CM

## 2023-06-10 MED ORDER — MIRTAZAPINE 15 MG PO TABS
15.0000 mg | ORAL_TABLET | Freq: Every day | ORAL | 0 refills | Status: DC
Start: 2023-06-10 — End: 2023-06-26
  Filled 2023-06-10: qty 30, 30d supply, fill #0

## 2023-06-10 MED ORDER — CITALOPRAM HYDROBROMIDE 10 MG PO TABS
10.0000 mg | ORAL_TABLET | Freq: Every day | ORAL | 0 refills | Status: DC
Start: 2023-06-10 — End: 2023-06-26
  Filled 2023-06-10: qty 30, 30d supply, fill #0

## 2023-06-11 ENCOUNTER — Ambulatory Visit: Payer: Self-pay | Admitting: Gerontology

## 2023-06-12 ENCOUNTER — Other Ambulatory Visit: Payer: Self-pay

## 2023-06-17 ENCOUNTER — Other Ambulatory Visit: Payer: Self-pay

## 2023-06-19 ENCOUNTER — Ambulatory Visit: Payer: Self-pay | Admitting: Gerontology

## 2023-06-26 ENCOUNTER — Other Ambulatory Visit: Payer: Self-pay

## 2023-06-26 ENCOUNTER — Ambulatory Visit: Payer: Self-pay | Admitting: Gerontology

## 2023-06-26 VITALS — BP 149/90 | HR 103 | Temp 98.0°F | Resp 18 | Ht 70.0 in | Wt 173.3 lb

## 2023-06-26 DIAGNOSIS — F1010 Alcohol abuse, uncomplicated: Secondary | ICD-10-CM

## 2023-06-26 DIAGNOSIS — I10 Essential (primary) hypertension: Secondary | ICD-10-CM

## 2023-06-26 DIAGNOSIS — F321 Major depressive disorder, single episode, moderate: Secondary | ICD-10-CM

## 2023-06-26 DIAGNOSIS — R7989 Other specified abnormal findings of blood chemistry: Secondary | ICD-10-CM

## 2023-06-26 DIAGNOSIS — Z0000 Encounter for general adult medical examination without abnormal findings: Secondary | ICD-10-CM

## 2023-06-26 DIAGNOSIS — R0683 Snoring: Secondary | ICD-10-CM | POA: Insufficient documentation

## 2023-06-26 MED ORDER — CITALOPRAM HYDROBROMIDE 10 MG PO TABS
10.0000 mg | ORAL_TABLET | Freq: Every day | ORAL | 0 refills | Status: DC
Start: 2023-06-26 — End: 2023-08-26
  Filled 2023-06-26 – 2023-07-25 (×2): qty 30, 30d supply, fill #0

## 2023-06-26 MED ORDER — MIRTAZAPINE 15 MG PO TABS
15.0000 mg | ORAL_TABLET | Freq: Every day | ORAL | 0 refills | Status: DC
  Filled 2023-06-26 – 2023-07-25 (×2): qty 30, 30d supply, fill #0

## 2023-06-26 NOTE — Patient Instructions (Signed)

## 2023-06-26 NOTE — Progress Notes (Signed)
Established Patient Office Visit  Subjective   Patient ID: Travis Leach, male    DOB: [birth year]  Age: 47 y.o. MRN: [id number]  No chief complaint on file.   HPI  Travis Leach is a 47 y.o. male who has history of hypertension, smoking, asthma, alcohol use, depression, presents for routine follow-up visit and medication refill .He states that he's compliant with his medication, denies side effects and continues to make healthy lifestyle changes . He was seen at the ED on 05/24/23 for Numbness, MRI of brain, showed Mildly motion degraded exam, 2.  No evidence of an acute intracranial abnormality. 3. There are a few tiny nonspecific chronic insults within the cerebral white matter. Head CT was normal. Currently, he denies left sided weakness. He reports that he snores and his friend said that he stops breathing for few seconds while he's sleeping.  He states that his mood is good, denies suicidal nor homicidal ideation. He states that he doesn't feel like doing anything sometimes, experiences difficulty staying asleep, and sleeps for 5 hours. His AST one month again was 91 and ALT was 74.  He endorses drinking beer 2-3 times a week, states that his last drink was yesterday, had 3 beers. He denies withdrawal symptoms, states that he's cutting back. Overall, he states that he's doing well and offers no further complaint.   PHQ-9 =3 GAD-7=2  Patient Active Problem List   Diagnosis Date Noted   Snoring 06/26/2023   Prediabetes 01/31/2022   Elevated lipids 01/31/2022   Hypomagnesemia 12/15/2021   Hyponatremia 12/15/2021   Elevated liver function tests 12/14/2021   Cocaine abuse (HCC) 12/14/2021   Delirium tremens (HCC) 12/13/2021   Hypokalemia 12/13/2021   Alcohol abuse 12/13/2021   Nicotine dependence 12/13/2021   Hypertension    Pancytopenia (HCC)    Past Medical History:  Diagnosis Date   Asthma    Depression    Hypertension    Pancreatitis    Past Surgical History:  Procedure  Laterality Date   NO PAST SURGERIES     Social History   Tobacco Use   Smoking status: Every Day    Current packs/day: 0.25    Average packs/day: 0.3 packs/day for 30.0 years (7.5 ttl pk-yrs)    Types: Cigarettes   Tobacco comments:    Gave [place] Quit info  Vaping Use   Vaping status: Never Used  Substance Use Topics   Alcohol use: Yes    Comment: 01/29/23 drinking 2-3 beers per week. has stopped drinking ~ 11/28/21, previously 1 pint and 4 beers per day   Drug use: Yes    Frequency: 2.0 times per week    Types: Marijuana   Family History  Problem Relation Age of Onset   Diabetes Mother    Pancreatitis Mother    Healthy Father    Healthy Sister    Other Maternal Grandmother        Patient must keep OV with Herbert Seta and Elizabeth on 08/14/22. No more medication will be sent in for patient.   Other Maternal Grandfather        Patient must keep OV with Herbert Seta and Lanora Manis on 08/14/22. No more medication will be sent in for patient.   Dementia Paternal Grandmother    Other Paternal Grandfather        Patient must keep OV with Herbert Seta and Lanora Manis on 08/14/22. No more medication will be sent in for patient.   No Known Allergies    Review of  Systems  Constitutional: Negative.   Eyes: Negative.   Respiratory: Negative.    Cardiovascular: Negative.   Gastrointestinal: Negative.   Neurological: Negative.   Psychiatric/Behavioral: Negative.        Objective:     BP (!) 149/90   Pulse (!) 103   Temp 98 F (36.7 C)   Resp 18   Ht 5\' 10"  (1.778 m)   Wt 173 lb 4.8 oz (78.6 kg)   SpO2 96%   BMI 24.87 kg/m  BP Readings from Last 3 Encounters:  06/26/23 (!) 149/90  05/24/23 (!) 141/87  05/01/23 120/79   Wt Readings from Last 3 Encounters:  06/26/23 173 lb 4.8 oz (78.6 kg)  05/24/23 165 lb (74.8 kg)  05/01/23 163 lb 8 oz (74.2 kg)      Physical Exam HENT:     Head: Normocephalic and atraumatic.     Mouth/Throat:     Mouth: Mucous membranes are moist.  Eyes:      Pupils: Pupils are equal, round, and reactive to light.  Cardiovascular:     Rate and Rhythm: Normal rate and regular rhythm.     Pulses: Normal pulses.     Heart sounds: Normal heart sounds.  Pulmonary:     Effort: Pulmonary effort is normal.     Breath sounds: Normal breath sounds.  Abdominal:     Palpations: Abdomen is soft.  Skin:    General: Skin is warm.  Neurological:     General: No focal deficit present.     Mental Status: He is alert and oriented to person, place, and time.  Psychiatric:        Mood and Affect: Mood normal.        Behavior: Behavior normal.        Thought Content: Thought content normal.        Judgment: Judgment normal.      No results found for any visits on 06/26/23.  Last CBC Lab Results  Component Value Date   WBC 8.8 05/24/2023   HGB 16.0 05/24/2023   HCT 47.0 05/24/2023   MCV 83.8 05/24/2023   MCH 27.6 05/24/2023   RDW 13.6 05/24/2023   PLT 246 05/24/2023   Last metabolic panel Lab Results  Component Value Date   GLUCOSE 119 (H) 05/24/2023   NA 139 05/24/2023   K 3.6 05/24/2023   CL 104 05/24/2023   CO2 19 (L) 05/24/2023   BUN 8 05/24/2023   CREATININE 1.00 05/24/2023   GFRNONAA >60 05/24/2023   CALCIUM 8.8 (L) 05/24/2023   PHOS 5.4 (H) 01/03/2022   PROT 8.8 (H) 05/24/2023   ALBUMIN 4.6 05/24/2023   LABGLOB 2.9 04/30/2023   AGRATIO 1.8 01/03/2022   BILITOT 0.5 05/24/2023   ALKPHOS 110 05/24/2023   AST 91 (H) 05/24/2023   ALT 74 (H) 05/24/2023   ANIONGAP 16 (H) 05/24/2023   Last lipids Lab Results  Component Value Date   CHOL 207 (H) 04/30/2023   HDL 63 04/30/2023   LDLCALC 127 (H) 04/30/2023   TRIG 93 04/30/2023   CHOLHDL 3.3 04/30/2023   Last hemoglobin A1c Lab Results  Component Value Date   HGBA1C 6.0 (H) 04/30/2023   Last thyroid functions No results found for: "TSH", "T3TOTAL", "T4TOTAL", "THYROIDAB" Last vitamin D Lab Results  Component Value Date   VD25OH 74.2 08/08/2022   Last vitamin B12 and  Folate Lab Results  Component Value Date   VITAMINB12 1,137 01/03/2022   FOLATE >20.0 01/03/2022  The 10-year ASCVD risk score (Arnett DK, et al., 2019) is: 15%    Assessment & Plan:   1. Depression, major, single episode, moderate (HCC) - He will continue current medication, will consult with Dr Mare Ferrari. He was advised to call the Crisis line for worsening symptoms. - citalopram (CELEXA) 10 MG tablet; Take 1 tablet (10 mg total) by mouth once daily.  Dispense: 30 tablet; Refill: 0 - mirtazapine (REMERON) 15 MG tablet; Take 1 tablet (15 mg total) by mouth at bedtime.  Dispense: 30 tablet; Refill: 0  2. Alcohol abuse - He was encouraged to abstain from alcoholic consumption.  3. Elevated liver function tests - He was encouraged on alcohol abstinence and increase water consumption.  4. Hypertension, unspecified type - His blood pressure is not under control, will continue current medication, DASH diet and exercise as tolerated.  5. Health care maintenance -Flu vaccine was administered - Flu Vaccine QUAD 6+ mos PF IM (Fluarix Quad PF)  6. Snoring - He was [facility] to [facility] for [facility]. - Ambulatory referral to Neurology   Return in about 4 weeks (around 07/24/2023), or if symptoms worsen or fail to improve.    Travis Shartzer Trellis Paganini, NP

## 2023-07-24 ENCOUNTER — Ambulatory Visit: Payer: Self-pay | Admitting: Gerontology

## 2023-07-25 ENCOUNTER — Other Ambulatory Visit: Payer: Self-pay

## 2023-08-26 ENCOUNTER — Other Ambulatory Visit: Payer: Self-pay

## 2023-08-26 ENCOUNTER — Other Ambulatory Visit: Payer: Self-pay | Admitting: Gerontology

## 2023-08-26 DIAGNOSIS — F321 Major depressive disorder, single episode, moderate: Secondary | ICD-10-CM

## 2023-08-26 DIAGNOSIS — I10 Essential (primary) hypertension: Secondary | ICD-10-CM

## 2023-08-26 MED ORDER — AMLODIPINE BESYLATE 10 MG PO TABS
10.0000 mg | ORAL_TABLET | Freq: Every day | ORAL | 1 refills | Status: DC
  Filled 2023-08-26: qty 90, 90d supply, fill #0

## 2023-08-26 MED ORDER — CITALOPRAM HYDROBROMIDE 10 MG PO TABS
10.0000 mg | ORAL_TABLET | Freq: Every day | ORAL | 0 refills | Status: DC
  Filled 2023-08-26: qty 30, 30d supply, fill #0

## 2023-09-02 ENCOUNTER — Ambulatory Visit: Payer: Self-pay | Admitting: Gerontology

## 2023-09-02 ENCOUNTER — Other Ambulatory Visit: Payer: Self-pay

## 2023-09-02 DIAGNOSIS — F321 Major depressive disorder, single episode, moderate: Secondary | ICD-10-CM

## 2023-09-02 DIAGNOSIS — E785 Hyperlipidemia, unspecified: Secondary | ICD-10-CM

## 2023-09-02 MED ORDER — CITALOPRAM HYDROBROMIDE 10 MG PO TABS
10.0000 mg | ORAL_TABLET | Freq: Every day | ORAL | 0 refills | Status: DC
Start: 2023-09-02 — End: 2023-11-06
  Filled 2023-09-02: qty 30, 30d supply, fill #0

## 2023-09-02 MED ORDER — MIRTAZAPINE 15 MG PO TABS
15.0000 mg | ORAL_TABLET | Freq: Every day | ORAL | 0 refills | Status: DC
Start: 2023-09-02 — End: 2023-11-06
  Filled 2023-09-02: qty 30, 30d supply, fill #0

## 2023-09-02 MED ORDER — PRAVASTATIN SODIUM 10 MG PO TABS
10.0000 mg | ORAL_TABLET | Freq: Every day | ORAL | 1 refills | Status: DC
Start: 2023-09-02 — End: 2023-12-17
  Filled 2023-09-02: qty 90, 90d supply, fill #0

## 2023-09-02 NOTE — Progress Notes (Signed)
Established Patient Office Visit  Subjective   Patient ID: Travis Leach, male    DOB: [birth year]  Age: 47 y.o. MRN: [id number]  No chief complaint on file.  Patient consented to telephone visit, 2 patient identifiers was used to identify patient.  HPI Travis Leach is a 47 y.o. male who has history of hypertension, smoking, asthma, alcohol use, depression, presents for routine follow-up visit .  He states that he's compliant with is medications, denies side effects and continues to make healthy lifestyle changes. He states that his mood is up and down, depressed during holidays due to past events in his life, which he didn't explain, but that his mood has improved since Friday. He states that his appetite has improved since Friday, states that he could be doing better because he doesn't have a job. He states that he has no energy to go out and look for a job, though he verbalized putting in some applications. He denies suicidal nor homicidal ideation. He states that he is doing well and offers no further complaint. PHQ-9 = 6 GAD-7 = 5  Review of Systems  Constitutional: Negative.   Respiratory: Negative.    Cardiovascular: Negative.   Psychiatric/Behavioral: Negative.        Objective:     There were no vitals taken for this visit. BP Readings from Last 3 Encounters:  06/26/23 (!) 149/90  05/24/23 (!) 141/87  05/01/23 120/79   Wt Readings from Last 3 Encounters:  06/26/23 173 lb 4.8 oz (78.6 kg)  05/24/23 165 lb (74.8 kg)  05/01/23 163 lb 8 oz (74.2 kg)      Physical Exam   No results found for any visits on 09/02/23.  Last CBC Lab Results  Component Value Date   WBC 8.8 05/24/2023   HGB 16.0 05/24/2023   HCT 47.0 05/24/2023   MCV 83.8 05/24/2023   MCH 27.6 05/24/2023   RDW 13.6 05/24/2023   PLT 246 05/24/2023   Last metabolic panel Lab Results  Component Value Date   GLUCOSE 119 (H) 05/24/2023   NA 139 05/24/2023   K 3.6 05/24/2023   CL 104 05/24/2023    CO2 19 (L) 05/24/2023   BUN 8 05/24/2023   CREATININE 1.00 05/24/2023   GFRNONAA >60 05/24/2023   CALCIUM 8.8 (L) 05/24/2023   PHOS 5.4 (H) 01/03/2022   PROT 8.8 (H) 05/24/2023   ALBUMIN 4.6 05/24/2023   LABGLOB 2.9 04/30/2023   AGRATIO 1.8 01/03/2022   BILITOT 0.5 05/24/2023   ALKPHOS 110 05/24/2023   AST 91 (H) 05/24/2023   ALT 74 (H) 05/24/2023   ANIONGAP 16 (H) 05/24/2023   Last lipids Lab Results  Component Value Date   CHOL 207 (H) 04/30/2023   HDL 63 04/30/2023   LDLCALC 127 (H) 04/30/2023   TRIG 93 04/30/2023   CHOLHDL 3.3 04/30/2023   Last hemoglobin A1c Lab Results  Component Value Date   HGBA1C 6.0 (H) 04/30/2023   Last thyroid functions No results found for: "TSH", "T3TOTAL", "T4TOTAL", "THYROIDAB" Last vitamin D Lab Results  Component Value Date   VD25OH 74.2 08/08/2022   Last vitamin B12 and Folate Lab Results  Component Value Date   VITAMINB12 1,137 01/03/2022   FOLATE >20.0 01/03/2022      The 10-year ASCVD risk score (Arnett DK, et al., 2019) is: 15%    Assessment & Plan:   1. Depression, major, single episode, moderate (HCC) - He will continue on current medication, advised to call the  Crisis help line for worsening symptoms. - citalopram (CELEXA) 10 MG tablet; Take 1 tablet (10 mg total) by mouth once daily.  Dispense: 30 tablet; Refill: 0 - mirtazapine (REMERON) 15 MG tablet; Take 1 tablet (15 mg total) by mouth at bedtime.  Dispense: 30 tablet; Refill: 0  2. Elevated lipids - He will continue on current medication, low fat/cholesterol diet and exercise as tolerated. - pravastatin (PRAVACHOL) 10 MG tablet; Take 1 tablet (10 mg total) by mouth once daily.  Dispense: 90 tablet; Refill: 1   Return in about 5 weeks (around 10/09/2023), or if symptoms worsen or fail to improve.    Alson Mcpheeters Trellis Paganini, NP

## 2023-09-15 ENCOUNTER — Other Ambulatory Visit: Payer: Self-pay

## 2023-10-09 ENCOUNTER — Ambulatory Visit: Payer: Self-pay | Admitting: Gerontology

## 2023-10-16 ENCOUNTER — Ambulatory Visit: Payer: Self-pay | Admitting: Gerontology

## 2023-11-04 ENCOUNTER — Telehealth: Payer: Self-pay

## 2023-11-04 NOTE — Telephone Encounter (Signed)
Received medical record request from diability. Records sent to disability office today.

## 2023-11-06 ENCOUNTER — Other Ambulatory Visit: Payer: Self-pay

## 2023-11-06 ENCOUNTER — Ambulatory Visit: Payer: Self-pay | Admitting: Gerontology

## 2023-11-06 ENCOUNTER — Encounter: Payer: Self-pay | Admitting: Gerontology

## 2023-11-06 VITALS — BP 149/101 | HR 69 | Ht 69.0 in | Wt 167.7 lb

## 2023-11-06 DIAGNOSIS — F321 Major depressive disorder, single episode, moderate: Secondary | ICD-10-CM

## 2023-11-06 DIAGNOSIS — I10 Essential (primary) hypertension: Secondary | ICD-10-CM

## 2023-11-06 MED ORDER — CITALOPRAM HYDROBROMIDE 10 MG PO TABS
10.0000 mg | ORAL_TABLET | Freq: Every day | ORAL | 0 refills | Status: DC
Start: 2023-11-06 — End: 2023-12-16
  Filled 2023-11-06: qty 30, 30d supply, fill #0

## 2023-11-06 MED ORDER — MIRTAZAPINE 15 MG PO TABS
15.0000 mg | ORAL_TABLET | Freq: Every day | ORAL | 0 refills | Status: DC
  Filled 2023-11-06: qty 30, 30d supply, fill #0

## 2023-11-06 NOTE — Patient Instructions (Signed)

## 2023-11-06 NOTE — Progress Notes (Signed)
 Established Patient Office Visit  Subjective   Patient ID: Travis Leach, male    DOB: 07/07/1976  Age: 48 y.o. MRN: [id number]  Chief Complaint  Patient presents with   Follow-up    Pt needs refills     HPI  Travis Leach is a 48 y.o. male who has history of hypertension, smoking, asthma, alcohol use, depression, presents for routine follow-up visit . He states that he's compliant with his medications, denies side effects and continues to make healthy lifestyle changes. His blood pressure is elevated, and was 149/101 when rechecked. He denies chest pain, palpitation, dizziness and vision changes. He doesn't check his blood pressure at home, continues to smoke 4 cigarettes daily and admits the desire to quit.   He reports drinking  beer occasionally and his last drink was yesterday stating that he had 2 drinks, but denies drinking Liquor. He denies withdrawal symptoms. He states that his mood is dysphoric, has had 3 deaths in the family, but denies suicidal nor homicidal ideation.Overall, he states that he's doing well and offers no further complaint.  PHQ-9= 7 GAD- 7= 3  Review of Systems  Constitutional: Negative.   Eyes: Negative.   Respiratory: Negative.    Cardiovascular: Negative.   Neurological: Negative.   Psychiatric/Behavioral: Negative.        Objective:     BP (!) 149/101 (BP Location: Right Arm, Patient Position: Sitting, Cuff Size: Large)   Pulse 69   Ht 5' 9 (1.753 m)   Wt 167 lb 11.2 oz (76.1 kg)   SpO2 97%   BMI 24.76 kg/m  BP Readings from Last 3 Encounters:  11/06/23 (!) 149/101  06/26/23 (!) 149/90  05/24/23 (!) 141/87   Wt Readings from Last 3 Encounters:  11/06/23 167 lb 11.2 oz (76.1 kg)  06/26/23 173 lb 4.8 oz (78.6 kg)  05/24/23 165 lb (74.8 kg)      Physical Exam HENT:     Head: Normocephalic and atraumatic.     Mouth/Throat:     Mouth: Mucous membranes are moist.  Eyes:     Extraocular Movements: Extraocular movements intact.      Pupils: Pupils are equal, round, and reactive to light.  Cardiovascular:     Rate and Rhythm: Normal rate and regular rhythm.     Pulses: Normal pulses.     Heart sounds: Normal heart sounds.  Pulmonary:     Effort: Pulmonary effort is normal.     Breath sounds: Normal breath sounds.  Abdominal:     General: Bowel sounds are normal. There is no distension.     Palpations: Abdomen is soft.     Tenderness: There is no abdominal tenderness.  Skin:    General: Skin is warm.  Neurological:     General: No focal deficit present.     Mental Status: He is alert and oriented to person, place, and time.  Psychiatric:        Mood and Affect: Mood normal.        Behavior: Behavior normal.      No results found for any visits on 11/06/23.  Last CBC Lab Results  Component Value Date   WBC 8.8 05/24/2023   HGB 16.0 05/24/2023   HCT 47.0 05/24/2023   MCV 83.8 05/24/2023   MCH 27.6 05/24/2023   RDW 13.6 05/24/2023   PLT 246 05/24/2023   Last metabolic panel Lab Results  Component Value Date   GLUCOSE 119 (H) 05/24/2023   NA  139 05/24/2023   K 3.6 05/24/2023   CL 104 05/24/2023   CO2 19 (L) 05/24/2023   BUN 8 05/24/2023   CREATININE 1.00 05/24/2023   GFRNONAA >60 05/24/2023   CALCIUM 8.8 (L) 05/24/2023   PHOS 5.4 (H) 01/03/2022   PROT 8.8 (H) 05/24/2023   ALBUMIN 4.6 05/24/2023   LABGLOB 2.9 04/30/2023   AGRATIO 1.8 01/03/2022   BILITOT 0.5 05/24/2023   ALKPHOS 110 05/24/2023   AST 91 (H) 05/24/2023   ALT 74 (H) 05/24/2023   ANIONGAP 16 (H) 05/24/2023   Last lipids Lab Results  Component Value Date   CHOL 207 (H) 04/30/2023   HDL 63 04/30/2023   LDLCALC 127 (H) 04/30/2023   TRIG 93 04/30/2023   CHOLHDL 3.3 04/30/2023   Last hemoglobin A1c Lab Results  Component Value Date   HGBA1C 6.0 (H) 04/30/2023   Last thyroid functions No results found for: TSH, T3TOTAL, T4TOTAL, THYROIDAB Last vitamin D  Lab Results  Component Value Date   VD25OH 74.2  08/08/2022   Last vitamin B12 and Folate Lab Results  Component Value Date   VITAMINB12 1,137 01/03/2022   FOLATE >20.0 01/03/2022      The 10-year ASCVD risk score (Arnett DK, et al., 2019) is: 15%    Assessment & Plan:   1. Depression, major, single episode, moderate (HCC) (Primary) - He will continue current medication, will consult with Psychiatrist  Dr Lavine, was advised to call the Crisis help line for worsening symptoms. - citalopram  (CELEXA ) 10 MG tablet; Take 1 tablet (10 mg total) by mouth once daily.  Dispense: 30 tablet; Refill: 0 - mirtazapine  (REMERON ) 15 MG tablet; Take 1 tablet (15 mg total) by mouth at bedtime.  Dispense: 30 tablet; Refill: 0  2. Hypertension, unspecified type - His blood pressure is not under control, he was advised to check and record readings and bring to follow up appointment. He was encouraged to continue on DASH diet, smoking cessation and abstain from alcohol consumption.He was educated on the signs and symptoms og Stroke and advised to go to the ED.    Return in about 4 weeks (around 12/04/2023), or if symptoms worsen or fail to improve.    Jakeob Tullis E Delbra Zellars, NP

## 2023-12-04 ENCOUNTER — Ambulatory Visit: Payer: Self-pay | Admitting: Gerontology

## 2023-12-11 ENCOUNTER — Other Ambulatory Visit: Payer: Self-pay

## 2023-12-16 ENCOUNTER — Other Ambulatory Visit: Payer: Self-pay

## 2023-12-16 DIAGNOSIS — F321 Major depressive disorder, single episode, moderate: Secondary | ICD-10-CM

## 2023-12-16 DIAGNOSIS — Z0000 Encounter for general adult medical examination without abnormal findings: Secondary | ICD-10-CM

## 2023-12-16 MED ORDER — CITALOPRAM HYDROBROMIDE 10 MG PO TABS
10.0000 mg | ORAL_TABLET | Freq: Every day | ORAL | 0 refills | Status: DC
Start: 2023-12-16 — End: 2024-01-06
  Filled 2023-12-16 (×2): qty 30, 30d supply, fill #0

## 2023-12-16 MED ORDER — MIRTAZAPINE 15 MG PO TABS
15.0000 mg | ORAL_TABLET | Freq: Every day | ORAL | 0 refills | Status: DC
Start: 2023-12-16 — End: 2024-01-29
  Filled 2023-12-16 (×2): qty 30, 30d supply, fill #0

## 2023-12-17 ENCOUNTER — Other Ambulatory Visit: Payer: Self-pay | Admitting: Gerontology

## 2023-12-17 ENCOUNTER — Other Ambulatory Visit: Payer: Self-pay

## 2023-12-17 DIAGNOSIS — I10 Essential (primary) hypertension: Secondary | ICD-10-CM

## 2023-12-17 DIAGNOSIS — E785 Hyperlipidemia, unspecified: Secondary | ICD-10-CM

## 2023-12-17 DIAGNOSIS — E559 Vitamin D deficiency, unspecified: Secondary | ICD-10-CM

## 2023-12-17 DIAGNOSIS — R062 Wheezing: Secondary | ICD-10-CM

## 2023-12-17 DIAGNOSIS — R7989 Other specified abnormal findings of blood chemistry: Secondary | ICD-10-CM

## 2023-12-17 LAB — COMPREHENSIVE METABOLIC PANEL
ALT: 40 IU/L (ref 0–44)
AST: 29 IU/L (ref 0–40)
Albumin: 4.5 g/dL (ref 4.1–5.1)
Alkaline Phosphatase: 112 IU/L (ref 44–121)
BUN/Creatinine Ratio: 8 — ABNORMAL LOW (ref 9–20)
BUN: 7 mg/dL (ref 6–24)
Bilirubin Total: 0.2 mg/dL (ref 0.0–1.2)
CO2: 22 mmol/L (ref 20–29)
Calcium: 10.1 mg/dL (ref 8.7–10.2)
Chloride: 103 mmol/L (ref 96–106)
Creatinine, Ser: 0.85 mg/dL (ref 0.76–1.27)
Globulin, Total: 3 g/dL (ref 1.5–4.5)
Glucose: 97 mg/dL (ref 70–99)
Potassium: 4.6 mmol/L (ref 3.5–5.2)
Sodium: 140 mmol/L (ref 134–144)
Total Protein: 7.5 g/dL (ref 6.0–8.5)
eGFR: 109 mL/min/{1.73_m2} (ref >59)

## 2023-12-17 LAB — LIPID PANEL
Chol/HDL Ratio: 5 ratio (ref 0.0–5.0)
Cholesterol, Total: 249 mg/dL — ABNORMAL HIGH (ref 100–199)
HDL: 50 mg/dL (ref >39)
LDL Chol Calc (NIH): 177 mg/dL — ABNORMAL HIGH (ref 0–99)
Triglycerides: 121 mg/dL (ref 0–149)
VLDL Cholesterol Cal: 22 mg/dL (ref 5–40)

## 2023-12-17 LAB — VITAMIN D 25 HYDROXY (VIT D DEFICIENCY, FRACTURES): Vit D, 25-Hydroxy: 16.6 ng/mL — ABNORMAL LOW (ref 30.0–100.0)

## 2023-12-17 LAB — TSH: TSH: 2.74 u[IU]/mL (ref 0.450–4.500)

## 2023-12-17 LAB — HEMOGLOBIN A1C
Est. average glucose Bld gHb Est-mCnc: 108 mg/dL
Hgb A1c MFr Bld: 5.4 % (ref 4.8–5.6)

## 2023-12-17 MED ORDER — VITAMIN D (ERGOCALCIFEROL) 1.25 MG (50000 UNIT) PO CAPS
50000.0000 [IU] | ORAL_CAPSULE | ORAL | 0 refills | Status: DC
Start: 2023-12-17 — End: 2024-03-09
  Filled 2023-12-17: qty 12, 84d supply, fill #0

## 2023-12-17 MED ORDER — PRAVASTATIN SODIUM 10 MG PO TABS
10.0000 mg | ORAL_TABLET | Freq: Every day | ORAL | 1 refills | Status: DC
  Filled 2023-12-17: qty 90, 90d supply, fill #0

## 2023-12-17 MED ORDER — ALBUTEROL SULFATE HFA 108 (90 BASE) MCG/ACT IN AERS
2.0000 | INHALATION_SPRAY | Freq: Four times a day (QID) | RESPIRATORY_TRACT | 2 refills | Status: AC | PRN
  Filled 2023-12-17: qty 6.7, 25d supply, fill #0
  Filled 2024-03-09: qty 6.7, 25d supply, fill #1
  Filled 2024-06-24 – 2024-11-01 (×2): qty 6.7, 25d supply, fill #2

## 2023-12-17 MED ORDER — THIAMINE HCL 100 MG PO TABS
100.0000 mg | ORAL_TABLET | Freq: Every day | ORAL | 2 refills | Status: AC
Start: 2023-12-17 — End: ?
  Filled 2023-12-17: qty 90, 90d supply, fill #0

## 2023-12-17 MED ORDER — AMLODIPINE BESYLATE 10 MG PO TABS
10.0000 mg | ORAL_TABLET | Freq: Every day | ORAL | 1 refills | Status: DC
Start: 2023-12-17 — End: 2024-03-11
  Filled 2023-12-17: qty 90, 90d supply, fill #0
  Filled 2024-03-09: qty 90, 90d supply, fill #1

## 2023-12-17 MED ORDER — FOLIC ACID 1 MG PO TABS
1.0000 mg | ORAL_TABLET | Freq: Every day | ORAL | 2 refills | Status: AC
  Filled 2023-12-17: qty 90, 90d supply, fill #0
  Filled 2024-03-09 (×2): qty 90, 90d supply, fill #1
  Filled 2024-06-24: qty 90, 90d supply, fill #2

## 2023-12-17 NOTE — Progress Notes (Signed)
 He was notified about his low vitamin D level, advised to pick up Ergocalciferol 50,000 units weekly from the Pharmacy and get some sunlight. He states that he was out of his Pravastatin 10 mg for 1 month, refilled medication and advised to continue on low fat/cholesterol diet and exercise as tolerated.

## 2023-12-23 ENCOUNTER — Ambulatory Visit: Payer: Self-pay | Admitting: Gerontology

## 2023-12-25 ENCOUNTER — Encounter: Payer: Self-pay | Admitting: Gerontology

## 2023-12-25 ENCOUNTER — Other Ambulatory Visit: Payer: Self-pay

## 2023-12-25 ENCOUNTER — Ambulatory Visit: Payer: Self-pay | Admitting: Gerontology

## 2023-12-25 VITALS — BP 147/94 | HR 103 | Ht 69.0 in | Wt 173.0 lb

## 2023-12-25 DIAGNOSIS — E785 Hyperlipidemia, unspecified: Secondary | ICD-10-CM

## 2023-12-25 DIAGNOSIS — F321 Major depressive disorder, single episode, moderate: Secondary | ICD-10-CM

## 2023-12-25 MED ORDER — PRAVASTATIN SODIUM 20 MG PO TABS
20.0000 mg | ORAL_TABLET | Freq: Every day | ORAL | 1 refills | Status: DC
Start: 2023-12-25 — End: 2024-04-01
  Filled 2023-12-25 – 2024-01-27 (×2): qty 30, 30d supply, fill #0

## 2023-12-25 NOTE — Progress Notes (Signed)
 Established Patient Office Visit  Subjective   Patient ID: Travis Leach, male    DOB: 03-May-1976  Age: 48 y.o. MRN: [id number]  Chief Complaint  Patient presents with   Follow-up    HPI Travis Leach is a 48 y.o. male who has history of hypertension, smoking, asthma, alcohol use, depression, presents for routine follow-up visit.  His labs done on  12/16/23, total cholesterol was 249 mg/dl, LDL was 440 mg/dl and vitamin D was 10.2 ng/ml, the rest was unremarkable.  His  blood pressure was elevated during visit at 147/94, he denies chest pain, palpitation, dizziness and vision changes.  He states that he's compliant with his medications, denies side effects and continues to make healthy lifestyle changes.  He reports that he received the prescription for Vitamin D and is taking it once weekly and getting some sunlight.  He  reports experiencing grief from the loss of his girlfriend of 7 years.  Patient reports he has not spoken to anyone about his grief and keeps it bottled up.  He states that his mood fluctuates, reports inability to sleep throughout the night. He reports going to bed at 11 pm and falls asleep at 12 MN and wakes up 3 am, experiencing difficulty falling back to sleep.  He denies suicidal nor homicidal ideation. He also reports that Mirtazapine causes him to have "crazy dreams" for many months and he states  that he has not notified anyone. He reports that he continues to drink 1-2 beers 3-4 times a week, and his last drink was yesterday. Though he denies withdrawal symptoms, states that he's cutting back, and he has some fine tremors to hands with out stretched arms. He admits to using marijuana and has used in the past week.  Overall, he states that he's doing well and offers no further complaint.  PHQ-9=12   GAD-7=12    Patient Active Problem List   Diagnosis Date Noted   Snoring 06/26/2023   Prediabetes 01/31/2022   Elevated lipids 01/31/2022   Hypomagnesemia 12/15/2021    Hyponatremia 12/15/2021   Elevated liver function tests 12/14/2021   Cocaine abuse (HCC) 12/14/2021   Delirium tremens (HCC) 12/13/2021   Hypokalemia 12/13/2021   Alcohol abuse 12/13/2021   Nicotine dependence 12/13/2021   Hypertension    Pancytopenia (HCC)    Past Medical History:  Diagnosis Date   Asthma    Depression    Hypertension    Pancreatitis    Past Surgical History:  Procedure Laterality Date   NO PAST SURGERIES     Social History   Tobacco Use   Smoking status: Every Day    Current packs/day: 0.25    Average packs/day: 0.3 packs/day for 30.0 years (7.5 ttl pk-yrs)    Types: Cigarettes   Tobacco comments:    Gave [place] Quit info  Vaping Use   Vaping status: Never Used  Substance Use Topics   Alcohol use: Yes    Comment: 01/29/23 drinking 2-3 beers per week. has stopped drinking ~ 11/28/21, previously 1 pint and 4 beers per day   Drug use: Yes    Frequency: 2.0 times per week    Types: Marijuana   Social History   Socioeconomic History   Marital status: Married    Spouse name: Not on file   Number of children: Not on file   Years of education: Not on file   Highest education level: Not on file  Occupational History   Not on file  Tobacco Use   Smoking status: Every Day    Current packs/day: 0.25    Average packs/day: 0.3 packs/day for 30.0 years (7.5 ttl pk-yrs)    Types: Cigarettes   Smokeless tobacco: Not on file   Tobacco comments:    Gave [place] Quit info  Vaping Use   Vaping status: Never Used  Substance and Sexual Activity   Alcohol use: Yes    Comment: 01/29/23 drinking 2-3 beers per week. has stopped drinking ~ 11/28/21, previously 1 pint and 4 beers per day   Drug use: Yes    Frequency: 2.0 times per week    Types: Marijuana   Sexual activity: Yes    Birth control/protection: None    Comment: Married  Other Topics Concern   Not on file  Social History Narrative   Not on file   Social Drivers of Health   Financial Resource Strain: Low Risk   (01/03/2022)   Overall Financial Resource Strain (CARDIA)    Difficulty of Paying Living Expenses: Not hard at all  Food Insecurity: No Food Insecurity (01/29/2023)   Hunger Vital Sign    Worried About Running Out of Food in the Last Year: Never true    Ran Out of Food in the Last Year: Never true  Transportation Needs: Unmet Transportation Needs (01/29/2023)   PRAPARE - Transportation    Lack of Transportation (Medical): Yes    Lack of Transportation (Non-Medical): Yes  Physical Activity: Insufficiently Active (01/03/2022)   Exercise Vital Sign    Days of Exercise per Week: 2 days    Minutes of Exercise per Session: 30 min  Stress: Stress Concern Present (01/03/2022)   [employer] of Occupational Health - Occupational Stress Questionnaire    Feeling of Stress : Very much  Social Connections: Moderately Isolated (01/03/2022)   Social Connection and Isolation Panel [NHANES]    Frequency of Communication with Friends and Family: More than three times a week    Frequency of Social Gatherings with Friends and Family: More than three times a week    Attends Religious Services: More than 4 times per year    Active Member of [employer] or Organizations: No    Attends Banker Meetings: Never    Marital Status: Separated  Intimate Partner Violence: Not At Risk (01/29/2023)   Humiliation, Afraid, Rape, and Kick questionnaire    Fear of Current or Ex-Partner: No    Emotionally Abused: No    Physically Abused: No    Sexually Abused: No   Family Status  Relation Name Status   Mother  Deceased   Father  Alive   Sister  Alive   MGM  Deceased   MGF  Deceased   PGM  Deceased   PGF  Deceased  No partnership data on file   Family History  Problem Relation Age of Onset   Diabetes Mother    Pancreatitis Mother    Healthy Father    Healthy Sister    Other Maternal Grandmother        Patient must keep OV with Herbert Seta and Elizabeth on 08/14/22. No more medication will be sent in for  patient.   Other Maternal Grandfather        Patient must keep OV with Herbert Seta and Lanora Manis on 08/14/22. No more medication will be sent in for patient.   Dementia Paternal Grandmother    Other Paternal Grandfather        Patient must keep OV with Herbert Seta and Lanora Manis on  08/14/22. No more medication will be sent in for patient.   No Known Allergies    Review of Systems  Constitutional: Negative.   Eyes: Negative.   Respiratory: Negative.    Cardiovascular: Negative.   Gastrointestinal: Negative.   Genitourinary: Negative.   Skin: Negative.   Neurological: Negative.   Endo/Heme/Allergies: Negative.   Psychiatric/Behavioral:  Positive for depression.       Objective:     BP (!) 147/94   Pulse (!) 103   Ht 5\' 9"  (1.753 m)   Wt 173 lb (78.5 kg)   SpO2 95%   BMI 25.55 kg/m  BP Readings from Last 3 Encounters:  12/25/23 (!) 147/94  11/06/23 (!) 149/101  06/26/23 (!) 149/90   Wt Readings from Last 3 Encounters:  12/25/23 173 lb (78.5 kg)  11/06/23 167 lb 11.2 oz (76.1 kg)  06/26/23 173 lb 4.8 oz (78.6 kg)      Physical Exam HENT:     Head: Normocephalic and atraumatic.     Mouth/Throat:     Mouth: Mucous membranes are moist.     Pharynx: Oropharynx is clear.  Eyes:     Extraocular Movements: Extraocular movements intact.     Pupils: Pupils are equal, round, and reactive to light.  Cardiovascular:     Rate and Rhythm: Normal rate and regular rhythm.     Pulses: Normal pulses.     Heart sounds: Normal heart sounds.  Pulmonary:     Effort: Pulmonary effort is normal.     Breath sounds: Normal breath sounds.  Abdominal:     General: Bowel sounds are normal. There is no distension.     Palpations: Abdomen is soft.     Tenderness: There is no abdominal tenderness.  Skin:    General: Skin is warm.     Capillary Refill: Capillary refill takes less than 2 seconds.  Neurological:     General: No focal deficit present.     Mental Status: He is alert and oriented  to person, place, and time.  Psychiatric:        Mood and Affect: Mood normal.        Behavior: Behavior normal.        Thought Content: Thought content normal.        Judgment: Judgment normal.      No results found for any visits on 12/25/23.  Last CBC Lab Results  Component Value Date   WBC 8.8 05/24/2023   HGB 16.0 05/24/2023   HCT 47.0 05/24/2023   MCV 83.8 05/24/2023   MCH 27.6 05/24/2023   RDW 13.6 05/24/2023   PLT 246 05/24/2023   Last metabolic panel Lab Results  Component Value Date   GLUCOSE 97 12/16/2023   NA 140 12/16/2023   K 4.6 12/16/2023   CL 103 12/16/2023   CO2 22 12/16/2023   BUN 7 12/16/2023   CREATININE 0.85 12/16/2023   EGFR 109 12/16/2023   CALCIUM 10.1 12/16/2023   PHOS 5.4 (H) 01/03/2022   PROT 7.5 12/16/2023   ALBUMIN 4.5 12/16/2023   LABGLOB 3.0 12/16/2023   AGRATIO 1.8 01/03/2022   BILITOT 0.2 12/16/2023   ALKPHOS 112 12/16/2023   AST 29 12/16/2023   ALT 40 12/16/2023   ANIONGAP 16 (H) 05/24/2023   Last lipids Lab Results  Component Value Date   CHOL 249 (H) 12/16/2023   HDL 50 12/16/2023   LDLCALC 177 (H) 12/16/2023   TRIG 121 12/16/2023   CHOLHDL 5.0 12/16/2023  Last hemoglobin A1c Lab Results  Component Value Date   HGBA1C 5.4 12/16/2023   Last thyroid functions Lab Results  Component Value Date   TSH 2.740 12/16/2023   Last vitamin D Lab Results  Component Value Date   VD25OH 16.6 (L) 12/16/2023   Last vitamin B12 and Folate Lab Results  Component Value Date   VITAMINB12 1,137 01/03/2022   FOLATE >20.0 01/03/2022      The 10-year ASCVD risk score (Arnett DK, et al., 2019) is: 16.5%    Assessment & Plan:  1. Elevated lipids (Primary) -The 10-year ASCVD risk score (Arnett DK, et al., 2019) is: 16.5%   Values used to calculate the score:     Age: 62 years     Sex: Male     Is Non-Hispanic African American: Yes     Diabetic: No     Tobacco smoker: Yes     Systolic Blood Pressure: 147 mmHg     Is  BP treated: Yes     HDL Cholesterol: 50 mg/dL     Total Cholesterol: 249 mg/dL -His ASCVD was 86.5%, his Pravachol was increased to 20 mg. He was strongly encouraged to continue on low fat/cholesterol diet and exercise as tolerated. - pravastatin (PRAVACHOL) 20 MG tablet; Take 1 tablet (20 mg total) by mouth daily.  Dispense: 30 tablet; Refill: 1  2. Depression, major, single episode, moderate (HCC) He is currently experiencing depressive symptoms due to the loss of his girlfriend of 7 years.  He will continue to take his current medication, will consult with Psychiatrist Dr. Mare Ferrari, ws advised to call the Crisis Help Line for worsening symptoms.   - citalopram (CELEXA) 10 MG tablet; Take 1 tablet (10 mg total) by mouth once daily.  Dispense: 30 tablet; Refill: 0 - mirtazapine (REMERON) 15 MG tablet; Take 1 tablet (15 mg total) by mouth at bedtime.  Dispense: 30 tablet; Refill: 0  Return in about 1 week (01/01/24), or if symptoms worsen or fail to improve.   Return in about 1 week (around 01/01/2024).    Chioma Trellis Paganini, NP

## 2023-12-25 NOTE — Patient Instructions (Signed)

## 2024-01-01 ENCOUNTER — Ambulatory Visit: Payer: Self-pay | Admitting: Gerontology

## 2024-01-05 ENCOUNTER — Other Ambulatory Visit: Payer: Self-pay

## 2024-01-06 ENCOUNTER — Ambulatory Visit: Payer: Self-pay | Admitting: Gerontology

## 2024-01-06 ENCOUNTER — Other Ambulatory Visit: Payer: Self-pay

## 2024-01-06 ENCOUNTER — Telehealth: Payer: Self-pay

## 2024-01-06 DIAGNOSIS — F321 Major depressive disorder, single episode, moderate: Secondary | ICD-10-CM

## 2024-01-06 DIAGNOSIS — R0683 Snoring: Secondary | ICD-10-CM

## 2024-01-06 MED ORDER — CITALOPRAM HYDROBROMIDE 10 MG PO TABS
10.0000 mg | ORAL_TABLET | Freq: Every day | ORAL | 0 refills | Status: DC
Start: 2024-01-06 — End: 2024-03-09
  Filled 2024-01-06 – 2024-01-27 (×2): qty 30, 30d supply, fill #0

## 2024-01-06 NOTE — Telephone Encounter (Signed)
 Made telephone appt today. Called pt to give appt. No answer left vmail.

## 2024-01-06 NOTE — Progress Notes (Signed)
 Established Patient Office Visit  Subjective   Patient ID: Travis Leach, male    DOB: [birth year]  Age: 48 y.o. MRN: [id number]  No chief complaint on file. Patient consents to telephone visit and 2 patient identifiers was used to identify patient.   HPI  Travis Leach is a 48 y.o. male who has history of hypertension, smoking, asthma, alcohol use, depression, presents for routine follow-up visit. He states that he's compliant with his medications, though he reports taking his 15 mg Remeron 3-4 times a week instead of daily.  He states that he takes his Remeron if he is not feeling sleepy after 10:00 at night.  He endorses sleeping around 11:00 pm, wakes up at 3 am and falls back to sleep.  He states that the days he takes his Remeron, his sister told him that he is constantly moving, talking in his sleep and snores heavily.  He denies having vivid dreams, he states that he sometimes jumps out of bed and kicks the wall and has moved his bed from the wall.  He states that his mood has been "down" after losing his girlfriend of 7 years on 12/05/23 due to complications of chronic alcohol use.  He denies suicidal nor homicidal ideation.  He states that he has not been able to grieve her loss, he  keeps to himself and tries to motivate himself to do something.  He states that he has not had any alcoholic beverage especially beer in 2 weeks, continues to cut down on his smoking and now smokes only 1 cigarette daily. Though he states that he is not feeling bad about himself, but endorses feelings of worthlessness occasionally. He states that he's worrying about how to better his life.  Overall, he states that he is doing well, offers no further complaint.  PHQ-9 =4 GAD-7 = 5   Patient Active Problem List   Diagnosis Date Noted   Snoring 06/26/2023   Prediabetes 01/31/2022   Elevated lipids 01/31/2022   Hypomagnesemia 12/15/2021   Hyponatremia 12/15/2021   Elevated liver function tests 12/14/2021    Cocaine abuse (HCC) 12/14/2021   Delirium tremens (HCC) 12/13/2021   Hypokalemia 12/13/2021   Alcohol abuse 12/13/2021   Nicotine dependence 12/13/2021   Hypertension    Pancytopenia (HCC)    Past Medical History:  Diagnosis Date   Asthma    Depression    Hypertension    Pancreatitis    Past Surgical History:  Procedure Laterality Date   NO PAST SURGERIES     Social History   Tobacco Use   Smoking status: Every Day    Current packs/day: 0.25    Average packs/day: 0.3 packs/day for 30.0 years (7.5 ttl pk-yrs)    Types: Cigarettes   Tobacco comments:    Gave [place] Quit info  Vaping Use   Vaping status: Never Used  Substance Use Topics   Alcohol use: Yes    Comment: 01/29/23 drinking 2-3 beers per week. has stopped drinking ~ 11/28/21, previously 1 pint and 4 beers per day   Drug use: Yes    Frequency: 2.0 times per week    Types: Marijuana   Family Status  Relation Name Status   Mother  Deceased   Father  Alive   Sister  Alive   MGM  Deceased   MGF  Deceased   PGM  Deceased   PGF  Deceased  No partnership data on file   Family History  Problem Relation Age of  Onset   Diabetes Mother    Pancreatitis Mother    Healthy Father    Healthy Sister    Other Maternal Grandmother        Patient must keep OV with Herbert Seta and Lanora Manis on 08/14/22. No more medication will be sent in for patient.   Other Maternal Grandfather        Patient must keep OV with Herbert Seta and Lanora Manis on 08/14/22. No more medication will be sent in for patient.   Dementia Paternal Grandmother    Other Paternal Grandfather        Patient must keep OV with Herbert Seta and Lanora Manis on 08/14/22. No more medication will be sent in for patient.   No Known Allergies    Review of Systems  Constitutional: Negative.   Eyes: Negative.   Respiratory: Negative.    Cardiovascular: Negative.   Skin: Negative.   Neurological: Negative.   Psychiatric/Behavioral:  Positive for depression. The patient is  nervous/anxious.       Objective:     There were no vitals taken for this visit. BP Readings from Last 3 Encounters:  12/25/23 (!) 147/94  11/06/23 (!) 149/101  06/26/23 (!) 149/90   Wt Readings from Last 3 Encounters:  12/25/23 173 lb (78.5 kg)  11/06/23 167 lb 11.2 oz (76.1 kg)  06/26/23 173 lb 4.8 oz (78.6 kg)      Physical Exam   No results found for any visits on 01/06/24.  Last CBC Lab Results  Component Value Date   WBC 8.8 05/24/2023   HGB 16.0 05/24/2023   HCT 47.0 05/24/2023   MCV 83.8 05/24/2023   MCH 27.6 05/24/2023   RDW 13.6 05/24/2023   PLT 246 05/24/2023   Last metabolic panel Lab Results  Component Value Date   GLUCOSE 97 12/16/2023   NA 140 12/16/2023   K 4.6 12/16/2023   CL 103 12/16/2023   CO2 22 12/16/2023   BUN 7 12/16/2023   CREATININE 0.85 12/16/2023   EGFR 109 12/16/2023   CALCIUM 10.1 12/16/2023   PHOS 5.4 (H) 01/03/2022   PROT 7.5 12/16/2023   ALBUMIN 4.5 12/16/2023   LABGLOB 3.0 12/16/2023   AGRATIO 1.8 01/03/2022   BILITOT 0.2 12/16/2023   ALKPHOS 112 12/16/2023   AST 29 12/16/2023   ALT 40 12/16/2023   ANIONGAP 16 (H) 05/24/2023   Last lipids Lab Results  Component Value Date   CHOL 249 (H) 12/16/2023   HDL 50 12/16/2023   LDLCALC 177 (H) 12/16/2023   TRIG 121 12/16/2023   CHOLHDL 5.0 12/16/2023   Last hemoglobin A1c Lab Results  Component Value Date   HGBA1C 5.4 12/16/2023   Last thyroid functions Lab Results  Component Value Date   TSH 2.740 12/16/2023   Last vitamin D Lab Results  Component Value Date   VD25OH 16.6 (L) 12/16/2023   Last vitamin B12 and Folate Lab Results  Component Value Date   VITAMINB12 1,137 01/03/2022   FOLATE >20.0 01/03/2022      The 10-year ASCVD risk score (Arnett DK, et al., 2019) is: 16.5%    Assessment & Plan:   1. Depression, major, single episode, moderate (HCC) (Primary) -He he is competent to make decisions, and he agreed to continue on current medication.   Will consult with psychiatry Dr. Mare Ferrari.  He was advised to call the crisis helpline or go to the emergency room worsening symptoms. - citalopram (CELEXA) 10 MG tablet; Take 1 tablet (10 mg total) by mouth once  daily.  Dispense: 30 tablet; Refill: 0  2. Snoring -He was encouraged to complete [facility] financial complication for sleep study referral. - Ambulatory referral to Neurology   Return in about 2 weeks (around 01/20/2024), or if symptoms worsen or fail to improve.    Zuzu Befort Trellis Paganini, NP

## 2024-01-19 ENCOUNTER — Other Ambulatory Visit: Payer: Self-pay

## 2024-01-20 ENCOUNTER — Ambulatory Visit: Payer: Self-pay | Admitting: Gerontology

## 2024-01-27 ENCOUNTER — Other Ambulatory Visit: Payer: Self-pay

## 2024-01-27 ENCOUNTER — Telehealth: Payer: Self-pay

## 2024-01-27 ENCOUNTER — Ambulatory Visit: Payer: Self-pay | Admitting: Gerontology

## 2024-01-27 NOTE — Telephone Encounter (Signed)
 Called pt to make telephone appt.  Pt stated he needed a refill on his celexa . Checked pt's chart there is a RX at the pharmacy waiting for him. Pt verbalized understanding.

## 2024-01-29 ENCOUNTER — Ambulatory Visit: Payer: Self-pay | Admitting: Gerontology

## 2024-01-29 ENCOUNTER — Other Ambulatory Visit: Payer: Self-pay

## 2024-01-29 DIAGNOSIS — F321 Major depressive disorder, single episode, moderate: Secondary | ICD-10-CM

## 2024-01-29 DIAGNOSIS — E559 Vitamin D deficiency, unspecified: Secondary | ICD-10-CM

## 2024-01-29 DIAGNOSIS — E785 Hyperlipidemia, unspecified: Secondary | ICD-10-CM

## 2024-01-29 MED ORDER — MIRTAZAPINE 15 MG PO TABS
15.0000 mg | ORAL_TABLET | Freq: Every day | ORAL | 0 refills | Status: DC
Start: 2024-01-29 — End: 2024-03-11
  Filled 2024-01-29: qty 30, 30d supply, fill #0

## 2024-01-29 NOTE — Patient Instructions (Signed)

## 2024-01-29 NOTE — Progress Notes (Signed)
 Established Patient Office Visit  Subjective   Patient ID: Travis Leach, male    DOB: 01-23-1976  Age: 48 y.o. MRN: [id number]  No chief complaint on file.   HPI  Travis Leach is a 48 y.o. male who has history of hypertension, smoking, asthma, alcohol use, depression, presents for routine follow-up visit.  He states that he is compliant with his medication, denies side effects and continues to make healthy lifestyle changes.  When asked about his mood, he states that " I am very happy, and in great spirit".  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  He states that he is sleeping well and does not get up from his sleep kicking the wall.  He denies suicidal or homicidal ideation.  Overall, he states that he is doing well, and offers no further complaints. PHQ-9 = 0 GAD-7= 0  Review of Systems  Constitutional: Negative.   Respiratory: Negative.    Cardiovascular: Negative.   Neurological: Negative.   Psychiatric/Behavioral: Negative.        Objective:     There were no vitals taken for this visit. BP Readings from Last 3 Encounters:  12/25/23 (!) 147/94  11/06/23 (!) 149/101  06/26/23 (!) 149/90   Wt Readings from Last 3 Encounters:  12/25/23 173 lb (78.5 kg)  11/06/23 167 lb 11.2 oz (76.1 kg)  06/26/23 173 lb 4.8 oz (78.6 kg)      Physical Exam   No results found for any visits on 01/29/24.  Last CBC Lab Results  Component Value Date   WBC 8.8 05/24/2023   HGB 16.0 05/24/2023   HCT 47.0 05/24/2023   MCV 83.8 05/24/2023   MCH 27.6 05/24/2023   RDW 13.6 05/24/2023   PLT 246 05/24/2023   Last metabolic panel Lab Results  Component Value Date   GLUCOSE 97 12/16/2023   NA 140 12/16/2023   K 4.6 12/16/2023   CL 103 12/16/2023   CO2 22 12/16/2023   BUN 7 12/16/2023   CREATININE 0.85 12/16/2023   EGFR 109 12/16/2023   CALCIUM 10.1 12/16/2023   PHOS 5.4 (H) 01/03/2022   PROT 7.5 12/16/2023    ALBUMIN 4.5 12/16/2023   LABGLOB 3.0 12/16/2023   AGRATIO 1.8 01/03/2022   BILITOT 0.2 12/16/2023   ALKPHOS 112 12/16/2023   AST 29 12/16/2023   ALT 40 12/16/2023   ANIONGAP 16 (H) 05/24/2023   Last lipids Lab Results  Component Value Date   CHOL 249 (H) 12/16/2023   HDL 50 12/16/2023   LDLCALC 177 (H) 12/16/2023   TRIG 121 12/16/2023   CHOLHDL 5.0 12/16/2023   Last hemoglobin A1c Lab Results  Component Value Date   HGBA1C 5.4 12/16/2023   Last thyroid functions Lab Results  Component Value Date   TSH 2.740 12/16/2023   Last vitamin D  Lab Results  Component Value Date   VD25OH 16.6 (L) 12/16/2023   Last vitamin B12 and Folate Lab Results  Component Value Date   VITAMINB12 1,137 01/03/2022   FOLATE >20.0 01/03/2022      The 10-year ASCVD risk score (Arnett DK, et al., 2019) is: 16.5%    Assessment & Plan:   1. Depression, major, single episode, moderate (HCC) (Primary) -He will continue current medication, and will consult with Dr. Lavine.  He was advised to call the crisis helpline for worsening symptoms. - mirtazapine  (REMERON ) 15 MG tablet; Take 1 tablet (15 mg  total) by mouth at bedtime.  Dispense: 30 tablet; Refill: 0  2. Elevated lipids -He will continue current medication, encouraged to continue on low-fat/cholesterol diet and will recheck lipids in the future. - Lipid panel; Future  3. Vitamin D  deficiency -He will continue taking his single Ergocalciferol  weekly, and advised to get some sunlight. - VITAMIN D  25 Hydroxy (Vit-D Deficiency, Fractures); Future   Return in about 7 weeks (around 03/16/2024), or if symptoms worsen or fail to improve.    Tavita Eastham E Thierry Dobosz, NP

## 2024-02-10 ENCOUNTER — Other Ambulatory Visit: Payer: Self-pay

## 2024-03-09 ENCOUNTER — Other Ambulatory Visit: Payer: Self-pay | Admitting: Gerontology

## 2024-03-09 ENCOUNTER — Other Ambulatory Visit: Payer: Self-pay

## 2024-03-09 DIAGNOSIS — F321 Major depressive disorder, single episode, moderate: Secondary | ICD-10-CM

## 2024-03-09 DIAGNOSIS — E559 Vitamin D deficiency, unspecified: Secondary | ICD-10-CM

## 2024-03-09 DIAGNOSIS — E785 Hyperlipidemia, unspecified: Secondary | ICD-10-CM

## 2024-03-10 ENCOUNTER — Other Ambulatory Visit: Payer: Self-pay

## 2024-03-11 ENCOUNTER — Other Ambulatory Visit: Payer: Self-pay

## 2024-03-11 DIAGNOSIS — F321 Major depressive disorder, single episode, moderate: Secondary | ICD-10-CM

## 2024-03-11 DIAGNOSIS — Z0000 Encounter for general adult medical examination without abnormal findings: Secondary | ICD-10-CM

## 2024-03-11 DIAGNOSIS — I10 Essential (primary) hypertension: Secondary | ICD-10-CM

## 2024-03-11 DIAGNOSIS — E785 Hyperlipidemia, unspecified: Secondary | ICD-10-CM

## 2024-03-11 DIAGNOSIS — E559 Vitamin D deficiency, unspecified: Secondary | ICD-10-CM

## 2024-03-11 MED ORDER — CITALOPRAM HYDROBROMIDE 10 MG PO TABS
10.0000 mg | ORAL_TABLET | Freq: Every day | ORAL | 0 refills | Status: DC
Start: 2024-03-11 — End: 2024-04-01
  Filled 2024-03-11: qty 30, 30d supply, fill #0

## 2024-03-11 MED ORDER — PRAVASTATIN SODIUM 10 MG PO TABS
10.0000 mg | ORAL_TABLET | Freq: Every day | ORAL | 1 refills | Status: DC
Start: 2024-03-11 — End: 2024-03-11
  Filled 2024-03-11: qty 90, 90d supply, fill #0

## 2024-03-11 MED ORDER — AMLODIPINE BESYLATE 10 MG PO TABS
10.0000 mg | ORAL_TABLET | Freq: Every day | ORAL | 1 refills | Status: AC
  Filled 2024-06-24: qty 90, 90d supply, fill #0

## 2024-03-11 MED ORDER — VITAMIN D (ERGOCALCIFEROL) 1.25 MG (50000 UNIT) PO CAPS
50000.0000 [IU] | ORAL_CAPSULE | ORAL | 0 refills | Status: DC
Start: 2024-03-11 — End: 2024-04-01
  Filled 2024-03-11: qty 12, 84d supply, fill #0

## 2024-03-11 MED ORDER — MIRTAZAPINE 15 MG PO TABS
15.0000 mg | ORAL_TABLET | Freq: Every day | ORAL | 0 refills | Status: DC
Start: 2024-03-11 — End: 2024-04-01
  Filled 2024-03-11: qty 30, 30d supply, fill #0

## 2024-03-12 ENCOUNTER — Other Ambulatory Visit: Payer: Self-pay

## 2024-03-12 LAB — VITAMIN D 25 HYDROXY (VIT D DEFICIENCY, FRACTURES): Vit D, 25-Hydroxy: 72.8 ng/mL (ref 30.0–100.0)

## 2024-03-12 LAB — LIPID PANEL
Chol/HDL Ratio: 3.8 ratio (ref 0.0–5.0)
Cholesterol, Total: 165 mg/dL (ref 100–199)
HDL: 44 mg/dL (ref >39)
LDL Chol Calc (NIH): 105 mg/dL — ABNORMAL HIGH (ref 0–99)
Triglycerides: 86 mg/dL (ref 0–149)
VLDL Cholesterol Cal: 16 mg/dL (ref 5–40)

## 2024-03-12 LAB — CBC WITH DIFFERENTIAL/PLATELET
Basophils Absolute: 0.1 10*3/uL (ref 0.0–0.2)
Basos: 1 %
EOS (ABSOLUTE): 0.1 10*3/uL (ref 0.0–0.4)
Eos: 1 %
Hematocrit: 49.1 % (ref 37.5–51.0)
Hemoglobin: 15.6 g/dL (ref 13.0–17.7)
Immature Grans (Abs): 0 10*3/uL (ref 0.0–0.1)
Immature Granulocytes: 0 %
Lymphocytes Absolute: 2.3 10*3/uL (ref 0.7–3.1)
Lymphs: 21 %
MCH: 27.4 pg (ref 26.6–33.0)
MCHC: 31.8 g/dL (ref 31.5–35.7)
MCV: 86 fL (ref 79–97)
Monocytes Absolute: 1 10*3/uL — ABNORMAL HIGH (ref 0.1–0.9)
Monocytes: 9 %
Neutrophils Absolute: 7.5 10*3/uL — ABNORMAL HIGH (ref 1.4–7.0)
Neutrophils: 68 %
Platelets: 231 10*3/uL (ref 150–450)
RBC: 5.7 x10E6/uL (ref 4.14–5.80)
RDW: 12.6 % (ref 11.6–15.4)
WBC: 11 10*3/uL — ABNORMAL HIGH (ref 3.4–10.8)

## 2024-03-16 ENCOUNTER — Ambulatory Visit: Payer: Self-pay | Admitting: Gerontology

## 2024-03-23 ENCOUNTER — Ambulatory Visit: Payer: Self-pay | Admitting: Gerontology

## 2024-03-25 ENCOUNTER — Telehealth: Payer: Self-pay | Admitting: Gerontology

## 2024-03-25 ENCOUNTER — Ambulatory Visit: Payer: Self-pay | Admitting: Gerontology

## 2024-03-25 NOTE — Telephone Encounter (Signed)
 Called patient with concern for a painless knot that started 1 week ago, denies erythema,and muscle nor motor weakness. He was advised to notify clinic with concerns, states he's in [place] for a funeral and rescheduled his appointment to next week.

## 2024-03-30 ENCOUNTER — Telehealth: Payer: Self-pay | Admitting: Gerontology

## 2024-03-30 NOTE — Telephone Encounter (Signed)
 Called to confirm appointment.

## 2024-04-01 ENCOUNTER — Ambulatory Visit: Payer: Self-pay | Admitting: Gerontology

## 2024-04-01 ENCOUNTER — Encounter: Payer: Self-pay | Admitting: Gerontology

## 2024-04-01 ENCOUNTER — Other Ambulatory Visit: Payer: Self-pay

## 2024-04-01 VITALS — BP 124/81 | HR 93 | Wt 175.1 lb

## 2024-04-01 DIAGNOSIS — E785 Hyperlipidemia, unspecified: Secondary | ICD-10-CM

## 2024-04-01 DIAGNOSIS — F321 Major depressive disorder, single episode, moderate: Secondary | ICD-10-CM

## 2024-04-01 DIAGNOSIS — R7303 Prediabetes: Secondary | ICD-10-CM

## 2024-04-01 DIAGNOSIS — L729 Follicular cyst of the skin and subcutaneous tissue, unspecified: Secondary | ICD-10-CM | POA: Insufficient documentation

## 2024-04-01 LAB — POCT GLYCOSYLATED HEMOGLOBIN (HGB A1C): Hemoglobin A1C: 5.7 % — AB (ref 4.0–5.6)

## 2024-04-01 LAB — GLUCOSE, POCT (MANUAL RESULT ENTRY): POC Glucose: 97 mg/dL (ref 70–99)

## 2024-04-01 MED ORDER — PRAVASTATIN SODIUM 20 MG PO TABS
20.0000 mg | ORAL_TABLET | Freq: Every day | ORAL | 1 refills | Status: DC
  Filled 2024-04-01: qty 30, 30d supply, fill #0
  Filled 2024-04-07: qty 90, 90d supply, fill #0

## 2024-04-01 MED ORDER — MIRTAZAPINE 15 MG PO TABS
15.0000 mg | ORAL_TABLET | Freq: Every day | ORAL | 0 refills | Status: DC
Start: 2024-04-01 — End: 2024-05-13
  Filled 2024-04-01: qty 30, 30d supply, fill #0

## 2024-04-01 MED ORDER — CITALOPRAM HYDROBROMIDE 10 MG PO TABS
10.0000 mg | ORAL_TABLET | Freq: Every day | ORAL | 0 refills | Status: DC
Start: 2024-04-01 — End: 2024-04-20
  Filled 2024-04-01 – 2024-04-07 (×2): qty 30, 30d supply, fill #0

## 2024-04-01 NOTE — Patient Instructions (Signed)
 Dyslipidemia Dyslipidemia is an imbalance of waxy, fat-like substances (lipids) in the blood. The body needs lipids in small amounts. Dyslipidemia often involves a high level of cholesterol or triglycerides, which are types of lipids. Common forms of dyslipidemia include: High levels of LDL cholesterol. LDL is the type of cholesterol that causes fatty deposits (plaques) to build up in the blood vessels that carry blood away from the heart (arteries). Low levels of HDL cholesterol. HDL cholesterol is the type of cholesterol that protects against heart disease. High levels of HDL remove the LDL buildup from arteries. High levels of triglycerides. Triglycerides are a fatty substance in the blood that is linked to a buildup of plaques in the arteries. What are the causes? There are two main types of dyslipidemia: primary and secondary. Primary dyslipidemia is caused by changes (mutations) in genes that are passed down through families (inherited). These mutations cause several types of dyslipidemia. Secondary dyslipidemia may be caused by various risk factors that can lead to the disease, such as lifestyle choices and certain medical conditions. What increases the risk? You are more likely to develop this condition if you are an older man or if you are a woman who has gone through menopause. Other risk factors include: Having a family history of dyslipidemia. Taking certain medicines, including birth control pills, steroids, some diuretics, and beta-blockers. Eating a diet high in saturated fat. Smoking cigarettes or excessive alcohol intake. Having certain medical conditions such as diabetes, polycystic ovary syndrome (PCOS), kidney disease, liver disease, or hypothyroidism. Not exercising regularly. Being overweight or obese with too much belly fat. What are the signs or symptoms? In most cases, dyslipidemia does not usually cause any symptoms. In severe cases, very high lipid levels can  cause: Fatty bumps under the skin (xanthomas). A white or gray ring around the black center (pupil) of the eye. Very high triglyceride levels can cause inflammation of the pancreas (pancreatitis). How is this diagnosed? Your health care provider may diagnose dyslipidemia based on a routine blood test (fasting blood test). Because most people do not have symptoms of the condition, this blood testing (lipid profile) is done on adults age 76 and older and is repeated every 4-6 years. This test checks: Total cholesterol. This measures the total amount of cholesterol in your blood, including LDL cholesterol, HDL cholesterol, and triglycerides. A healthy number is below 200 mg/dL (4.09 mmol/L). LDL cholesterol. The target number for LDL cholesterol is different for each person, depending on individual risk factors. A healthy number is usually below 100 mg/dL (8.11 mmol/L). Ask your health care provider what your LDL cholesterol should be. HDL cholesterol. An HDL level of 60 mg/dL (9.14 mmol/L) or higher is best because it helps to protect against heart disease. A number below 40 mg/dL (7.82 mmol/L) for men or below 50 mg/dL (9.56 mmol/L) for women increases the risk for heart disease. Triglycerides. A healthy triglyceride number is below 150 mg/dL (2.13 mmol/L). If your lipid profile is abnormal, your health care provider may do other blood tests. How is this treated? Treatment depends on the type of dyslipidemia that you have and your other risk factors for heart disease and stroke. Your health care provider will have a target range for your lipid levels based on this information. Treatment for dyslipidemia starts with lifestyle changes, such as diet and exercise. Your health care provider may recommend that you: Get regular exercise. Make changes to your diet. Quit smoking if you smoke. Limit your alcohol intake. If diet  changes and exercise do not help you reach your goals, your health care provider  may also prescribe medicine to lower lipids. The most commonly prescribed type of medicine lowers your LDL cholesterol (statin drug). If you have a high triglyceride level, your provider may prescribe another type of drug (fibrate) or an omega-3 fish oil supplement, or both. Follow these instructions at home: Eating and drinking  Follow instructions from your health care provider or dietitian about eating or drinking restrictions. Eat a healthy diet as told by your health care provider. This can help you reach and maintain a healthy weight, lower your LDL cholesterol, and raise your HDL cholesterol. This may include: Limiting your calories, if you are overweight. Eating more fruits, vegetables, whole grains, fish, and lean meats. Limiting saturated fat, trans fat, and cholesterol. Do not drink alcohol if: Your health care provider tells you not to drink. You are pregnant, may be pregnant, or are planning to become pregnant. If you drink alcohol: Limit how much you have to: 0-1 drink a day for women. 0-2 drinks a day for men. Know how much alcohol is in your drink. In the [place] one drink equals one 12 oz bottle of beer (355 mL), one 5 oz glass of wine (148 mL), or one 1 oz glass of hard liquor (44 mL). Activity Get regular exercise. Start an exercise and strength training program as told by your health care provider. Ask your health care provider what activities are safe for you. Your health care provider may recommend: 30 minutes of aerobic activity 4-6 days a week. Brisk walking is an example of aerobic activity. Strength training 2 days a week. General instructions Do not use any products that contain nicotine or tobacco. These products include cigarettes, chewing tobacco, and vaping devices, such as e-cigarettes. If you need help quitting, ask your health care provider. Take over-the-counter and prescription medicines only as told by your health care provider. This includes  supplements. Keep all follow-up visits. This is important. Contact a health care provider if: You are having trouble sticking to your exercise or diet plan. You are struggling to quit smoking or to control your use of alcohol. Summary Dyslipidemia often involves a high level of cholesterol or triglycerides, which are types of lipids. Treatment depends on the type of dyslipidemia that you have and your other risk factors for heart disease and stroke. Treatment for dyslipidemia starts with lifestyle changes, such as diet and exercise. Your health care provider may prescribe medicine to lower lipids. This information is not intended to replace advice given to you by your health care provider. Make sure you discuss any questions you have with your health care provider. Document Revised: 04/19/2022 Document Reviewed: 11/20/2020 Elsevier Patient Education  2024 [employer].

## 2024-04-01 NOTE — Progress Notes (Signed)
 Established Patient Office Visit  Subjective   Patient ID: Travis Leach, male    DOB: [birth year]  Age: 48 y.o. MRN: [id number]  Chief Complaint  Patient presents with   Follow-up    HPI  Travis Leach is a 48 y.o. male who has history of hypertension, smoking, asthma, alcohol use, depression, presents for routine follow-up visit and lab review.  His labs done on 03/11/24 was unremarkable. He states that he's compliant with his medications, denies side effects and continues to make healthy lifestyle changes.  He c/o painless, motile quarter sized cyst to left axilla that started  almost a year ago. He states that the size has increased over time and states that it is uncomfortable. He reports losing 2 family members in the past 2 weeks, and states that he has been feeling down and lonely. He states that he's worried that he might be the next, though he denies suicidal nor homicidal ideation. He states that he cut down drinking alcohol, had 1 can of beer in 7 days. He states that his mood is irritable, and doesn't want to be bothered, and it's been like that for the past couple of weeks. Overall, he states that he's doing well and offers no further complaint.  PHQ- 9 = 7 GAD- 7 = 11   Review of Systems  Constitutional: Negative.   HENT: Negative.    Eyes: Negative.   Respiratory: Negative.    Cardiovascular: Negative.   Gastrointestinal: Negative.   Genitourinary: Negative.   Musculoskeletal: Negative.   Skin: Negative.        Cyst to left armpit  Neurological: Negative.   Endo/Heme/Allergies: Negative.   Psychiatric/Behavioral: Negative.        Objective:     BP 124/81   Pulse 93   Wt 175 lb 1.6 oz (79.4 kg)   SpO2 95%   BMI 25.86 kg/m  BP Readings from Last 3 Encounters:  04/01/24 124/81  12/25/23 (!) 147/94  11/06/23 (!) 149/101   Wt Readings from Last 3 Encounters:  04/01/24 175 lb 1.6 oz (79.4 kg)  12/25/23 173 lb (78.5 kg)  11/06/23 167 lb 11.2 oz (76.1 kg)       Physical Exam HENT:     Head: Normocephalic and atraumatic.     Mouth/Throat:     Mouth: Mucous membranes are moist.  Eyes:     Extraocular Movements: Extraocular movements intact.     Conjunctiva/sclera: Conjunctivae normal.     Pupils: Pupils are equal, round, and reactive to light.  Cardiovascular:     Rate and Rhythm: Normal rate and regular rhythm.     Pulses: Normal pulses.     Heart sounds: Normal heart sounds.  Pulmonary:     Effort: Pulmonary effort is normal.     Breath sounds: Normal breath sounds.  Skin:    General: Skin is warm.     Capillary Refill: Capillary refill takes less than 2 seconds.     Comments: Quarter sized painless and motile cyst to left axilla,   Neurological:     General: No focal deficit present.     Mental Status: He is alert and oriented to person, place, and time.  Psychiatric:        Mood and Affect: Mood normal.        Behavior: Behavior normal.        Thought Content: Thought content normal.        Judgment: Judgment normal.  Results for orders placed or performed in visit on 04/01/24  POCT Glucose (CBG)  Result Value Ref Range   POC Glucose 97 70 - 99 mg/dl  POCT HgB J8R  Result Value Ref Range   Hemoglobin A1C 5.7 (A) 4.0 - 5.6 %   HbA1c POC (<> result, manual entry)     HbA1c, POC (prediabetic range)     HbA1c, POC (controlled diabetic range)      Last CBC Lab Results  Component Value Date   WBC 11.0 (H) 03/11/2024   HGB 15.6 03/11/2024   HCT 49.1 03/11/2024   MCV 86 03/11/2024   MCH 27.4 03/11/2024   RDW 12.6 03/11/2024   PLT 231 03/11/2024   Last metabolic panel Lab Results  Component Value Date   GLUCOSE 97 12/16/2023   NA 140 12/16/2023   K 4.6 12/16/2023   CL 103 12/16/2023   CO2 22 12/16/2023   BUN 7 12/16/2023   CREATININE 0.85 12/16/2023   EGFR 109 12/16/2023   CALCIUM 10.1 12/16/2023   PHOS 5.4 (H) 01/03/2022   PROT 7.5 12/16/2023   ALBUMIN 4.5 12/16/2023   LABGLOB 3.0 12/16/2023    AGRATIO 1.8 01/03/2022   BILITOT 0.2 12/16/2023   ALKPHOS 112 12/16/2023   AST 29 12/16/2023   ALT 40 12/16/2023   ANIONGAP 16 (H) 05/24/2023   Last lipids Lab Results  Component Value Date   CHOL 165 03/11/2024   HDL 44 03/11/2024   LDLCALC 105 (H) 03/11/2024   TRIG 86 03/11/2024   CHOLHDL 3.8 03/11/2024   Last hemoglobin A1c Lab Results  Component Value Date   HGBA1C 5.7 (A) 04/01/2024   Last thyroid functions Lab Results  Component Value Date   TSH 2.740 12/16/2023   Last vitamin D  Lab Results  Component Value Date   VD25OH 72.8 03/11/2024   Last vitamin B12 and Folate Lab Results  Component Value Date   VITAMINB12 1,137 01/03/2022   FOLATE >20.0 01/03/2022      The 10-year ASCVD risk score (Arnett DK, et al., 2019) is: 11.3%    Assessment & Plan:   1. Prediabetes (Primary) -His HgbA1c was 5.7%, he will continue on low carb/non concentrated sweet diet and exercise as tolerated. - POCT Glucose (CBG) - POCT HgB A1C  2. Elevated lipids - The 10-year ASCVD risk score (Arnett DK, et al., 2019) is: 11.3%   Values used to calculate the score:     Age: 48 years     Clincally relevant sex: Male     Is Non-Hispanic African American: Yes     Diabetic: No     Tobacco smoker: Yes     Systolic Blood Pressure: 124 mmHg     Is BP treated: Yes     HDL Cholesterol: 44 mg/dL     Total Cholesterol: 165 mg/dL His ASCVD risk is 88.6%, he will continue current medication, will titrate medication after rechecking his Lipid panel. He was advised to continue on low fat/cholesterol diet and exercise as tolerated. - pravastatin  (PRAVACHOL ) 20 MG tablet; Take 1 tablet (20 mg total) by mouth daily.  Dispense: 90 tablet; Refill: 1  3. Depression, major, single episode, moderate (HCC) - He will continue current medication, will follow up with [facility] Counselor Ms. Powell Griffin. He was advised to call the Crisis help line with worsening symptoms. - citalopram  (CELEXA ) 10 MG tablet;  Take 1 tablet (10 mg total) by mouth once daily.  Dispense: 30 tablet; Refill: 0 - mirtazapine  (REMERON ) 15  MG tablet; Take 1 tablet (15 mg total) by mouth at bedtime.  Dispense: 30 tablet; Refill: 0  4. Cyst of skin - Unknown etiology of cyst to left axilla, he will follow with General Surgery. - Ambulatory referral to General Surgery   Return in about 6 weeks (around 05/13/2024), or if symptoms worsen or fail to improve, for Appointment with Powell 7/8 on Phone, F/U 05/13/24 on Telephone.    Kijuan Gallicchio E Arhum Peeples, NP

## 2024-04-06 ENCOUNTER — Ambulatory Visit: Payer: Self-pay | Admitting: Licensed Clinical Social Worker

## 2024-04-06 ENCOUNTER — Other Ambulatory Visit: Payer: Self-pay

## 2024-04-06 ENCOUNTER — Ambulatory Visit: Payer: Self-pay | Admitting: Gerontology

## 2024-04-06 NOTE — BH Specialist Note (Deleted)
 Integrated Behavioral Health Follow Up In-Person Visit  MRN: [id number] Name: JEFFREE CAZEAU  Number of Integrated Behavioral Health Clinician visits: No data recorded Session Start time: No data recorded  Session End time: No data recorded Total time in minutes: No data recorded   Types of Service: Individual psychotherapy  Interpretor:No. Interpretor Name and Language: ***  Subjective: LABRADFORD SCHNITKER is a 48 y.o. male accompanied by {Patient accompanied [phone]} Patient was referred by *** for ***. Patient reports the following symptoms/concerns: *** Duration of problem: ***; Severity of problem: {Mild/Moderate/Severe:20260}  Objective: Mood: {BHH MOOD:22306} and Affect: {BHH AFFECT:22307} Risk of harm to self or others: {CHL AMB BH Suicide Current Mental Status:21022748}  Life Context: Family and Social: *** School/Work: *** Self-Care: *** Life Changes: ***  Patient and/or Family's Strengths/Protective Factors: {CHL AMB BH PROTECTIVE FACTORS:[phone]}  Goals Addressed: Patient will:  Reduce symptoms of: {IBH Symptoms:21014056}   Increase knowledge and/or ability of: {IBH Patient Tools:21014057}   Demonstrate ability to: {IBH Goals:21014053}  Progress towards Goals: {CHL AMB BH PROGRESS TOWARDS GOALS:[phone]}  Interventions: Interventions utilized:  {IBH Interventions:21014054} Standardized Assessments completed: {IBH Screening Tools:21014051}      Patient and/or Family Response: ***  Patient Centered Plan: Patient is on the following Treatment Plan(s): ***  Clinical Assessment/Diagnosis  No diagnosis found.    Assessment: Patient currently experiencing ***.   Patient may benefit from ***.  Plan: Follow up with behavioral health clinician on : *** Behavioral recommendations: *** Referral(s): {IBH Referrals:21014055}  Powell JINNY Griffin, LCSWA

## 2024-04-06 NOTE — BH Specialist Note (Unsigned)
 ADULT Comprehensive Clinical Assessment (CCA) Note   04/06/2024 Travis Leach [id number]   Referring Provider: Almarie Simmonds, NP Session Start time: No data recorded   Session End time: No data recorded Total time in minutes: No data recorded   SUBJECTIVE: Travis Leach is a 48 y.o.   male accompanied by self  Travis Leach was seen in consultation at the request of Iloabachie, Chioma E, NP for evaluation of mental health.  Types of Service: Individual psychotherapy  Reason for referral in patient/family's own words:  The patient stated he has felt down following the deaths of his girlfriend and his boss.     He likes to be called Brain.  He came to the appointment with himself.  Primary language at home is [place].  Constitutional Appearance: cooperative, well-nourished, well-developed, alert and well-appearing  (Patient to answer as appropriate) Gender identity: male Sex assigned at birth: male Pronouns: he    Mental status exam:   General Appearance /Behavior:  Did not assess due to telephone visit.  Eye Contact:  Did not assess due to telephone visit.  Motor Behavior:  Did not assess due to telephone visit.  Speech:  Normal Level of Consciousness:  Alert Mood:  Depressed Affect:  Did not assess due to telephone visit.  Anxiety Level:  Moderate Thought Process:  Coherent Thought Content:  WNL Perception:  Normal Judgment:  Fair Insight:  Present   Current Medications and therapies: He is taking:   Outpatient Encounter Medications as of 04/06/2024  Medication Sig   albuterol  (VENTOLIN  HFA) 108 (90 Base) MCG/ACT inhaler Inhale 2 puffs into the lungs every 6 (six) hours as needed for wheezing or shortness of breath.   amLODipine  (NORVASC ) 10 MG tablet Take 1 tablet (10 mg total) by mouth once daily.   citalopram  (CELEXA ) 10 MG tablet Take 1 tablet (10 mg total) by mouth once daily.   folic acid  (FOLVITE ) 1 MG tablet Take 1 tablet (1 mg total) by mouth once  daily.   mirtazapine  (REMERON ) 15 MG tablet Take 1 tablet (15 mg total) by mouth at bedtime.   pravastatin  (PRAVACHOL ) 20 MG tablet Take 1 tablet (20 mg total) by mouth daily.   thiamine  (VITAMIN B1) 100 MG tablet Take 1 tablet (100 mg total) by mouth daily.   No facility-administered encounter medications on file as of 04/06/2024.     Therapies:    Family history: Family mental illness:  No known history of anxiety disorder, panic disorder, social anxiety disorder, depression, suicide attempt, suicide completion, bipolar disorder, schizophrenia, eating disorder, personality disorder, OCD, PTSD, ADHD Family school achievement history:  {CHL AMB FAMILY SCHOOL ACHIEVEMENT HISTORY:[phone]} Other relevant family history:  {CHL AMB OTHER RELEVANT FAMILY HISTORY:210130114}  Social History: Now living with father. Employment:  Not employed Religious or Spiritual Beliefs: Did not assess today.  Mood: He {CHL AMB PARENTS MOOD [phone]}. {CHL AMB [phone]}  Negative Mood Concerns {CHL AMB NEGATIVE THOUGHTS:210130169}. Self-injury:  {CHL AMB DID NOT JDX:789869825} Suicidal ideation:  {CHL AMB DID NOT JDX:789869825} Suicide attempt:  {CHL AMB DID NOT JDX:789869825}  Additional Anxiety Concerns: Panic attacks:  {CHL AMB YES/NO/NOT APPLICABLE:210130111} Obsessions:  {CHL AMB YES/NO/NOT APPLICABLE:210130111} Compulsions:  No  Stressors:  Finances, Grief/losses, and Job loss/unemployment  Alcohol and/or Substance Use: Have you recently consumed alcohol? no  Have you recently used any drugs?  no  Have you recently consumed any tobacco? no Does patient seem concerned about dependence or abuse of any substance? no  Substance Use Disorder  Checklist:  ***  Severity Risk Scoring based on DSM-5 Criteria for Substance Use Disorder. The presence of at least two (2) criteria in the last 12 months indicate a substance use disorder. The severity of the substance use disorder is  defined as:  Mild: Presence of 2-3 criteria Moderate: Presence of 4-5 criteria Severe: Presence of 6 or more criteria  Traumatic Experiences: History or current traumatic events (natural disaster, house fire, etc.)? {YES/NO/WILD RJMID:81418} History or current physical trauma?  {YES/NO/WILD RJMID:81418} History or current emotional trauma?  {YES/NO/WILD RJMID:81418} History or current sexual trauma?  {YES/NO/WILD RJMID:81418} History or current domestic or intimate partner violence?  {YES/NO/WILD RJMID:81418} History of bullying:  {YES/NO/WILD CARDS:18581}  Risk Assessment: Suicidal or homicidal thoughts?   {YES/NO/WILD RJMID:79211} Self injurious behaviors?  {YES/NO/WILD RJMID:79211} Guns in the home?  {YES/NO/WILD RJMID:79211}  Self Harm Risk Factors: {CHL AMB BH SELF HARM RISK [phone]}  Self Harm Thoughts?: {CHL AMB BH SELF HARM THOUGHTS:[phone]}  Patient and/or Family's Strengths/Protective Factors: {CHL AMB BH PROTECTIVE [phone]}  Patient's and/or Family's Goals in their own words: ***  Interventions: Interventions utilized:  {IBH Interventions:21014054:::0}   Patient and/or Family Response: ***  Standardized Assessments completed: {IBH Screening Tools:21014051:::0}  Patient Centered Plan: Patient is on the following Treatment Plan(s):  ***  Clinical Assessment/Diagnosis  No diagnosis found.      Assessment: Patient currently experiencing see above.   Patient may benefit from see above.  Coordination of Care: ***  DSM-5 Diagnosis: ***  Recommendations for Services/Supports/Treatments: ***  Progress towards Goals: Ongoing  Treatment Plan Summary: Behavioral Health Clinician will: {CHL AMB BH TREATMENT PLAN SUMMARY THERAPIST TPOO:[id number]}  Individual will: {CHL AMB BH TREATMENT PLAN SUMMARY INDIVDUAL TPOO:[id number]}  Referral(s): {IBH Referrals:21014055}  Powell JINNY Griffin, LCSWA

## 2024-04-07 ENCOUNTER — Other Ambulatory Visit: Payer: Self-pay

## 2024-04-14 ENCOUNTER — Encounter: Payer: Self-pay | Admitting: Surgery

## 2024-04-14 ENCOUNTER — Ambulatory Visit: Payer: Self-pay | Admitting: Surgery

## 2024-04-14 VITALS — BP 154/95 | HR 73 | Temp 98.9°F | Ht 70.0 in | Wt 176.2 lb

## 2024-04-14 DIAGNOSIS — R2232 Localized swelling, mass and lump, left upper limb: Secondary | ICD-10-CM

## 2024-04-14 DIAGNOSIS — L723 Sebaceous cyst: Secondary | ICD-10-CM

## 2024-04-14 NOTE — Patient Instructions (Signed)
Skin Abscess  A skin abscess is an infected spot of skin. It can have pus in it. An abscess can happen in any part of your body. Some abscesses break open (rupture) on their own. Most keep getting worse unless they are treated. If your abscess is not treated, the infection can spread deeper into your body and blood. This can make you feel sick. What are the causes? Germs that enter your skin. This may happen if you have: A cut or scrape. A wound from a needle or an insect bite. Blocked oil or sweat glands. A problem with the spot where your hair goes into your skin. A fluid-filled sac called a cyst under your skin. What increases the risk? Having problems with how your blood moves through your body. Having a weak body defense system (immune system). Having diabetes. Having dry and irritated skin. Needing to get shots often. Putting drugs into your body with a needle. Having a splinter or something else in your skin. Smoking. What are the signs or symptoms? A firm bump under your skin that hurts. A bump with pus at the top. Redness and swelling. Warm or tender spots. A sore on the skin. How is this treated? You may need to: Put a heat pack or a warm, wet washcloth on the spot. Have the pus drained. Take antibiotics. Follow these instructions at home: Medicines Take over-the-counter and prescription medicines only as told by your doctor. If you were prescribed antibiotics, take them as told by your doctor. Do not stop taking them even if you start to feel better. Abscess care  If you have an abscess that has not drained, put heat on it. Use the heat source that your doctor recommends, such as a moist heat pack or a heating pad. Place a towel between your skin and the heat source. Leave the heat on for 20-30 minutes. If your skin turns bright red, take off the heat right away to prevent burns. The risk of burns is higher if you cannot feel pain, heat, or cold. Follow  instructions from your doctor about how to take care of your abscess. Make sure you: Cover the abscess with a bandage. Wash your hands with soap and water for at least 20 seconds before and after you change your bandage. If you cannot use soap and water, use hand sanitizer. Change your bandage as told by your doctor. Check your abscess every day for signs that the infection is getting worse. Check for: More redness, swelling, or pain. More fluid or blood. Warmth. More pus or a worse smell. General instructions To keep the infection from spreading: Do not share personal items or towels. Do not go in a hot tub with others. Avoid making skin contact with others. Be careful when you get rid of used bandages or any pus from the abscess. Do not smoke or use any products that contain nicotine or tobacco. If you need help quitting, ask your doctor. Contact a doctor if: You see red streaks on your skin near the abscess. You have any signs of worse infection. You vomit every time you eat or drink. You have a fever, chills, or muscle aches. The cyst or abscess comes back. Get help right away if: You have very bad pain. You make less pee (urine) than normal. This information is not intended to replace advice given to you by your health care provider. Make sure you discuss any questions you have with your health care provider. Document Revised: 05/01/2022  Document Reviewed: 05/01/2022 Elsevier Patient Education  2024 [employer].

## 2024-04-14 NOTE — Progress Notes (Signed)
 04/14/2024  Reason for Visit:  Left axillary cyst  Requesting Provider:  Chioma Iloabachie, NP  History of Present Illness: Travis Leach is a 48 Leach.o. male presenting for evaluation of a left axillary cyst.  The patient reports that he has had it for about a year, and has overall stayed similar in size.  Denies any pain associated with it, any redness, inflammation, tenderness, or drainage.  He also reports having just noticed a small mass in the right posterolateral aspect of the neck about two weeks ago when he was cutting his hair.  However, also denies any pain, redness, swelling, or drainage associated with it.  His main concern is to make sure these are not cancerous.  Past Medical History: Past Medical History:  Diagnosis Date   Asthma    Depression    Hypertension    Pancreatitis      Past Surgical History: Past Surgical History:  Procedure Laterality Date   NO PAST SURGERIES      Home Medications: Prior to Admission medications   Medication Sig Start Date End Date Taking? Authorizing Provider  albuterol  (VENTOLIN  HFA) 108 (90 Base) MCG/ACT inhaler Inhale 2 puffs into the lungs every 6 (six) hours as needed for wheezing or shortness of breath. 12/17/23  Yes Iloabachie, Chioma E, NP  amLODipine  (NORVASC ) 10 MG tablet Take 1 tablet (10 mg total) by mouth once daily. 03/11/24  Yes Iloabachie, Chioma E, NP  citalopram  (CELEXA ) 10 MG tablet Take 1 tablet (10 mg total) by mouth once daily. 04/01/24  Yes Iloabachie, Chioma E, NP  folic acid  (FOLVITE ) 1 MG tablet Take 1 tablet (1 mg total) by mouth once daily. 12/17/23  Yes Iloabachie, Chioma E, NP  mirtazapine  (REMERON ) 15 MG tablet Take 1 tablet (15 mg total) by mouth at bedtime. 04/01/24  Yes Iloabachie, Chioma E, NP  pravastatin  (PRAVACHOL ) 20 MG tablet Take 1 tablet (20 mg total) by mouth daily. 04/01/24  Yes Iloabachie, Chioma E, NP  thiamine  (VITAMIN B1) 100 MG tablet Take 1 tablet (100 mg total) by mouth daily. 12/17/23  Yes  Iloabachie, Chioma E, NP    Allergies: No Known Allergies  Social History:  reports that he has been smoking cigarettes. He has a 7.5 pack-year smoking history. He does not have any smokeless tobacco history on file. He reports current alcohol use. He reports current drug use. Frequency: 2.00 times per week. Drug: Marijuana.   Family History: Family History  Problem Relation Age of Onset   Diabetes Mother    Pancreatitis Mother    Healthy Father    Healthy Sister    Other Maternal Grandmother        Patient must keep OV with Powell and Elizabeth on 08/14/22. No more medication will be sent in for patient.   Other Maternal Grandfather        Patient must keep OV with Powell and Almarie on 08/14/22. No more medication will be sent in for patient.   Dementia Paternal Grandmother    Other Paternal Grandfather        Patient must keep OV with Powell and Almarie on 08/14/22. No more medication will be sent in for patient.    Review of Systems: Review of Systems  Constitutional:  Negative for chills and fever.  Respiratory:  Negative for shortness of breath.   Cardiovascular:  Negative for chest pain.  Skin:        Cyst in left axilla and new mass in right posterolateral neck  Physical Exam BP (!) 154/95   Pulse 73   Temp 98.9 F (37.2 C) (Oral)   Ht 5' 10 (1.778 m)   Wt 176 lb 3.2 oz (79.9 kg)   SpO2 95%   BMI 25.28 kg/m  CONSTITUTIONAL: No acute distress HEENT:  Normocephalic, atraumatic, extraocular motion intact. RESPIRATORY:  Normal respiratory effort without pathologic use of accessory muscles. CARDIOVASCULAR: Regular rhythm and rate. SKIN: The patient has a 2 x 1 cm mass in the left axilla which feels superficial.  It is soft, with mushy contents, mobile, without any tenderness, erythema, or drainage.  At the inferior portion of the right posterolateral neck, the patient has a 5 mm mass that is feels to be in subcutaneous space vs deep dermis, without any  pores, erythema, drainage, or tenderness. NEUROLOGIC:  Motor and sensation is grossly normal.  Cranial nerves are grossly intact. PSYCH:  Alert and oriented to person, place and time. Affect is normal.  Laboratory Analysis: No results found for this or any previous visit (from the past 24 hours).  Imaging: No results found.  Assessment and Plan: This is a 48 Leach.o. male with a left axillary mass and right posterolateral mass  --Discussed with the patient that the left axillary mass is most likely a cyst.  Based on location, it could be associated with hidradenitis, although the patient denies any other areas of cyst formation.  Or it could also be a sebaceous cyst.  Discussed the differences between these and that neither of these is malignant or poses a risk for cancer.  The small mass in the right posterolateral neck could be also the beginning of a cyst vs a lipoma given that it feels a bit deeper.  Also discussed with him that this would be a benign mass without any risk of cancer.   --The patient is not interested in pursuing excision of either one unless it was truly necessary.  At this point, since neither one is symptomatic or giving the patient any issues, we do not need to do any procedures and can monitor closely.  Discussed with him reasons for proceeding with I&Travis or excision of either area and symptoms to look out for. --Follow up as needed.  I spent 20 minutes dedicated to the care of this patient on the date of this encounter to include pre-visit review of records, face-to-face time with the patient discussing diagnosis and management, and any post-visit coordination of care.   Aloysius Sheree Plant, MD [facility]

## 2024-04-20 ENCOUNTER — Ambulatory Visit: Payer: Self-pay | Admitting: Licensed Clinical Social Worker

## 2024-04-20 ENCOUNTER — Other Ambulatory Visit: Payer: Self-pay

## 2024-04-20 DIAGNOSIS — F321 Major depressive disorder, single episode, moderate: Secondary | ICD-10-CM

## 2024-04-20 DIAGNOSIS — E785 Hyperlipidemia, unspecified: Secondary | ICD-10-CM

## 2024-04-20 MED ORDER — SIMVASTATIN 20 MG PO TABS
20.0000 mg | ORAL_TABLET | Freq: Every day | ORAL | 0 refills | Status: DC
  Filled 2024-04-20 (×2): qty 30, 30d supply, fill #0

## 2024-04-20 MED ORDER — CITALOPRAM HYDROBROMIDE 20 MG PO TABS
20.0000 mg | ORAL_TABLET | Freq: Every day | ORAL | 0 refills | Status: DC
  Filled 2024-04-20 (×3): qty 30, 30d supply, fill #0

## 2024-04-20 NOTE — BH Specialist Note (Unsigned)
 Integrated Behavioral Health Follow Up In-Person Visit  MRN: [id number] Name: Travis Leach  Number of Integrated Behavioral Health Clinician visits: No data recorded Session Start time: No data recorded  Session End time: No data recorded Total time in minutes: No data recorded   Types of Service: {CHL AMB TYPE OF SERVICE:[phone]}  Interpretor:{yes wn:[id number]} Interpretor Name and Language: ***  Subjective: Travis Leach is a 48 y.o. male accompanied by {Patient accompanied [phone]} Patient was referred by *** for ***. Patient reports the following symptoms/concerns: *** Duration of problem: ***; Severity of problem: {Mild/Moderate/Severe:20260}  Objective: Mood: {BHH MOOD:22306} and Affect: {BHH AFFECT:22307} Risk of harm to self or others: {CHL AMB BH Suicide Current Mental Status:21022748}  Life Context: Family and Social: *** School/Work: *** Self-Care: *** Life Changes: ***  Patient and/or Family's Strengths/Protective Factors: {CHL AMB BH PROTECTIVE FACTORS:[phone]}  Goals Addressed: Patient will:  Reduce symptoms of: {IBH Symptoms:21014056}   Increase knowledge and/or ability of: {IBH Patient Tools:21014057}   Demonstrate ability to: {IBH Goals:21014053}  Progress towards Goals: {CHL AMB BH PROGRESS TOWARDS [phone]}  Interventions: Interventions utilized:  {IBH Interventions:21014054} Standardized Assessments completed: {IBH Screening Tools:21014051}      Patient and/or Family Response: ***  Patient Centered Plan: Patient is on the following Treatment Plan(s): ***  Clinical Assessment/Diagnosis  No diagnosis found.    Assessment: Patient currently experiencing ***.   Patient may benefit from ***.  Plan: Follow up with behavioral health clinician on : *** Behavioral recommendations: *** Referral(s): {IBH Referrals:21014055}  Powell JINNY Griffin, LCSWA

## 2024-04-30 ENCOUNTER — Other Ambulatory Visit: Payer: Self-pay

## 2024-05-13 ENCOUNTER — Other Ambulatory Visit: Payer: Self-pay

## 2024-05-13 ENCOUNTER — Ambulatory Visit: Payer: Self-pay | Admitting: Gerontology

## 2024-05-13 ENCOUNTER — Other Ambulatory Visit (HOSPITAL_COMMUNITY): Payer: Self-pay

## 2024-05-13 VITALS — BP 133/92 | HR 86 | Temp 98.1°F | Ht 69.0 in | Wt 184.0 lb

## 2024-05-13 DIAGNOSIS — E785 Hyperlipidemia, unspecified: Secondary | ICD-10-CM

## 2024-05-13 DIAGNOSIS — H60502 Unspecified acute noninfective otitis externa, left ear: Secondary | ICD-10-CM

## 2024-05-13 DIAGNOSIS — R6884 Jaw pain: Secondary | ICD-10-CM | POA: Insufficient documentation

## 2024-05-13 DIAGNOSIS — H6090 Unspecified otitis externa, unspecified ear: Secondary | ICD-10-CM | POA: Insufficient documentation

## 2024-05-13 DIAGNOSIS — F321 Major depressive disorder, single episode, moderate: Secondary | ICD-10-CM

## 2024-05-13 MED ORDER — MIRTAZAPINE 15 MG PO TABS
15.0000 mg | ORAL_TABLET | Freq: Every day | ORAL | 0 refills | Status: DC
  Filled 2024-05-13: qty 30, 30d supply, fill #0

## 2024-05-13 MED ORDER — SIMVASTATIN 20 MG PO TABS
20.0000 mg | ORAL_TABLET | Freq: Every day | ORAL | 0 refills | Status: DC
  Filled 2024-05-13: qty 30, 30d supply, fill #0

## 2024-05-13 MED ORDER — CITALOPRAM HYDROBROMIDE 20 MG PO TABS
20.0000 mg | ORAL_TABLET | Freq: Every day | ORAL | 0 refills | Status: DC
  Filled 2024-05-13: qty 30, 30d supply, fill #0

## 2024-05-13 MED ORDER — OFLOXACIN 0.3 % OP SOLN
2.0000 [drp] | Freq: Two times a day (BID) | OPHTHALMIC | 0 refills | Status: DC
  Filled 2024-05-13: qty 5, 25d supply, fill #0

## 2024-05-13 NOTE — Progress Notes (Signed)
 Established Patient Office Visit  Subjective   Patient ID: Travis Leach, male    DOB: August 16, 1976  Age: 48 y.o. MRN: [id number]  No chief complaint on file.  Patient consented to telephone visit, used 2 patient identifier to identify patient.  HPI  Travis Leach is a 48 y.o. male who has history of hypertension, smoking, asthma, alcohol use, depression, presents for routine follow-up visit. He states that he is compliant with his medications, denies side effects and continues to make healthy lifestyle changes. He was seen by the Surgeon Dr Desiderio  on 04/14/24 for left axillary cyst and right posterolateral mass  and per his note, The patient is not interested in pursuing excision of either one unless it was truly necessary. At this point, since neither one is symptomatic or giving the patient any issues, we do not need to do any procedures and can monitor closely. He denies any pain to cyst or increase in size. Currently, he c/o discomfort to his left ear, jaw, swollen chi, and sore throat that started one week ago. He denies fever, chills, rhinorrhea and sick contacts. He states that taking Ibuprofen minimally relieve symptoms. Overall, he states that he's concerned about his left ear, jaw and sore throat symptoms.   Review of Systems  Constitutional: Negative.   HENT:  Positive for ear pain and sore throat.   Eyes: Negative.   Respiratory: Negative.    Cardiovascular: Negative.   Skin: Negative.   Neurological: Negative.   Psychiatric/Behavioral: Negative.        Objective:     There were no vitals taken for this visit. BP Readings from Last 3 Encounters:  04/14/24 (!) 154/95  04/01/24 124/81  12/25/23 (!) 147/94   Wt Readings from Last 3 Encounters:  04/14/24 176 lb 3.2 oz (79.9 kg)  04/01/24 175 lb 1.6 oz (79.4 kg)  12/25/23 173 lb (78.5 kg)      Physical Exam   No results found for any visits on 05/13/24.  Last CBC Lab Results  Component Value Date   WBC 11.0  (H) 03/11/2024   HGB 15.6 03/11/2024   HCT 49.1 03/11/2024   MCV 86 03/11/2024   MCH 27.4 03/11/2024   RDW 12.6 03/11/2024   PLT 231 03/11/2024   Last metabolic panel Lab Results  Component Value Date   GLUCOSE 97 12/16/2023   NA 140 12/16/2023   K 4.6 12/16/2023   CL 103 12/16/2023   CO2 22 12/16/2023   BUN 7 12/16/2023   CREATININE 0.85 12/16/2023   EGFR 109 12/16/2023   CALCIUM 10.1 12/16/2023   PHOS 5.4 (H) 01/03/2022   PROT 7.5 12/16/2023   ALBUMIN 4.5 12/16/2023   LABGLOB 3.0 12/16/2023   AGRATIO 1.8 01/03/2022   BILITOT 0.2 12/16/2023   ALKPHOS 112 12/16/2023   AST 29 12/16/2023   ALT 40 12/16/2023   ANIONGAP 16 (H) 05/24/2023   Last lipids Lab Results  Component Value Date   CHOL 165 03/11/2024   HDL 44 03/11/2024   LDLCALC 105 (H) 03/11/2024   TRIG 86 03/11/2024   CHOLHDL 3.8 03/11/2024   Last hemoglobin A1c Lab Results  Component Value Date   HGBA1C 5.7 (A) 04/01/2024   Last thyroid functions Lab Results  Component Value Date   TSH 2.740 12/16/2023   Last vitamin D  Lab Results  Component Value Date   VD25OH 72.8 03/11/2024   Last vitamin B12 and Folate Lab Results  Component Value Date   VITAMINB12 1,137  01/03/2022   FOLATE >20.0 01/03/2022      The 10-year ASCVD risk score (Arnett DK, et al., 2019) is: 16.5%    Assessment & Plan:   1. Jaw pain (Primary) - Unable to assess symptoms due to telephone visit. He scheduled an in person appointment this evening.   Return in about 13 weeks (around 08/12/2024), or if symptoms worsen or fail to improve.    Irvine Glorioso E Sienna Stonehocker, NP

## 2024-05-13 NOTE — Progress Notes (Signed)
 Established Patient Office Visit  Subjective   Patient ID: Travis Leach, male    DOB: Mar 10, 1976  Age: 48 y.o. MRN: [id number]  No chief complaint on file.   HPI  Travis Leach is a 48 y.o. male who has history of hypertension, smoking, asthma, alcohol use, depression, presents for routine follow-up visit and medication refill. He c/o shoting pain that radiates from left jaw to his ear, coughing and swallowing aggravates symptom.  He denies fever, chills, rhinorrhea, sinus pain , otorrhea, tooth ache and sick contacts.  He endorses otalgia ,mastoid pain and ear fullness sensation.  Overall, he states that he is doing well and offers no further complaints.  Review of Systems  Constitutional: Negative.   HENT:  Positive for ear pain and sore throat. Negative for ear discharge and sinus pain.   Respiratory: Negative.    Cardiovascular: Negative.   Skin: Negative.   Neurological: Negative.   Psychiatric/Behavioral: Negative.        Objective:     BP (!) 133/92 (BP Location: Left Arm, Patient Position: Sitting, Cuff Size: Large)   Pulse 86   Temp 98.1 F (36.7 C)   Ht 5' 9 (1.753 m)   Wt 184 lb (83.5 kg)   BMI 27.17 kg/m  BP Readings from Last 3 Encounters:  05/13/24 (!) 133/92  04/14/24 (!) 154/95  04/01/24 124/81   Wt Readings from Last 3 Encounters:  05/13/24 184 lb (83.5 kg)  04/14/24 176 lb 3.2 oz (79.9 kg)  04/01/24 175 lb 1.6 oz (79.4 kg)      Physical Exam HENT:     Right Ear: Hearing and ear canal normal. No tenderness. No middle ear effusion. No mastoid tenderness.     Left Ear: Hearing normal. Tenderness present. A middle ear effusion is present. There is mastoid tenderness.     Nose: Nose normal.  Eyes:     Extraocular Movements: Extraocular movements intact.     Pupils: Pupils are equal, round, and reactive to light.  Cardiovascular:     Rate and Rhythm: Normal rate and regular rhythm.     Pulses: Normal pulses.     Heart sounds: Normal heart  sounds.  Pulmonary:     Effort: Pulmonary effort is normal.     Breath sounds: Normal breath sounds.  Skin:    General: Skin is warm.  Neurological:     Mental Status: He is alert.  Psychiatric:        Mood and Affect: Mood normal.        Behavior: Behavior normal.      No results found for any visits on 05/13/24.  Last CBC Lab Results  Component Value Date   WBC 11.0 (H) 03/11/2024   HGB 15.6 03/11/2024   HCT 49.1 03/11/2024   MCV 86 03/11/2024   MCH 27.4 03/11/2024   RDW 12.6 03/11/2024   PLT 231 03/11/2024   Last metabolic panel Lab Results  Component Value Date   GLUCOSE 97 12/16/2023   NA 140 12/16/2023   K 4.6 12/16/2023   CL 103 12/16/2023   CO2 22 12/16/2023   BUN 7 12/16/2023   CREATININE 0.85 12/16/2023   EGFR 109 12/16/2023   CALCIUM 10.1 12/16/2023   PHOS 5.4 (H) 01/03/2022   PROT 7.5 12/16/2023   ALBUMIN 4.5 12/16/2023   LABGLOB 3.0 12/16/2023   AGRATIO 1.8 01/03/2022   BILITOT 0.2 12/16/2023   ALKPHOS 112 12/16/2023   AST 29 12/16/2023   ALT 40  12/16/2023   ANIONGAP 16 (H) 05/24/2023   Last lipids Lab Results  Component Value Date   CHOL 165 03/11/2024   HDL 44 03/11/2024   LDLCALC 105 (H) 03/11/2024   TRIG 86 03/11/2024   CHOLHDL 3.8 03/11/2024   Last hemoglobin A1c Lab Results  Component Value Date   HGBA1C 5.7 (A) 04/01/2024   Last thyroid functions Lab Results  Component Value Date   TSH 2.740 12/16/2023   Last vitamin D  Lab Results  Component Value Date   VD25OH 72.8 03/11/2024   Last vitamin B12 and Folate Lab Results  Component Value Date   VITAMINB12 1,137 01/03/2022   FOLATE >20.0 01/03/2022      The 10-year ASCVD risk score (Arnett DK, et al., 2019) is: 12.8%    Assessment & Plan:   1. Elevated lipids - The 10-year ASCVD risk score (Arnett DK, et al., 2019) is: 13.4%   Values used to calculate the score:     Age: 67 years     Clincally relevant sex: Male     Is Non-Hispanic African American: Yes      Diabetic: No     Tobacco smoker: Yes     Systolic Blood Pressure: 133 mmHg     Is BP treated: Yes     HDL Cholesterol: 44 mg/dL     Total Cholesterol: 165 mg/dL His ASCVD is 86.5%, he will continue Zocor , low-fat cholesterol diet and exercise as tolerated. - simvastatin  (ZOCOR ) 20 MG tablet; Take 1 tablet (20 mg total) by mouth daily.  Dispense: 30 tablet; Refill: 0  2. Depression, major, single episode, moderate (HCC) -His mood is stable, will continue current medication and advised to call the crisis helpline for worsening symptoms. - mirtazapine  (REMERON ) 15 MG tablet; Take 1 tablet (15 mg total) by mouth at bedtime.  Dispense: 30 tablet; Refill: 0 - citalopram  (CELEXA ) 20 MG tablet; Take 1 tablet (20 mg total) by mouth daily.  Dispense: 30 tablet; Refill: 0  3. Acute otitis externa of left ear, unspecified type (Primary) -He has ear effusion and mild erythema, was started on eardrops, educated on medication side effects and advised to notify clinic.  He was advised to notify clinic for worsening symptoms.  He was advised to take Tylenol  650 mg or ibuprofen 400 mg every 12 hours for pain. - ofloxacin  (OCUFLOX ) 0.3 % ophthalmic solution; Place 2 drops into the left ear 2 (two) times daily.  Dispense: 5 mL; Refill: 0    Return in about 4 weeks (around 06/10/2024), or if symptoms worsen or fail to improve.    Mischa Brittingham E Toby Ayad, NP

## 2024-06-24 ENCOUNTER — Other Ambulatory Visit: Payer: Self-pay

## 2024-06-24 ENCOUNTER — Other Ambulatory Visit: Payer: Self-pay | Admitting: Gerontology

## 2024-06-24 DIAGNOSIS — F321 Major depressive disorder, single episode, moderate: Secondary | ICD-10-CM

## 2024-06-24 DIAGNOSIS — E785 Hyperlipidemia, unspecified: Secondary | ICD-10-CM

## 2024-06-24 MED FILL — Citalopram Hydrobromide Tab 20 MG (Base Equiv): ORAL | 30 days supply | Qty: 30 | Fill #0 | Status: AC

## 2024-06-24 MED FILL — Mirtazapine Tab 15 MG: ORAL | 30 days supply | Qty: 30 | Fill #0 | Status: AC

## 2024-06-24 MED FILL — Simvastatin Tab 20 MG: ORAL | 30 days supply | Qty: 30 | Fill #0 | Status: AC

## 2024-08-03 ENCOUNTER — Other Ambulatory Visit: Payer: Self-pay | Admitting: Gerontology

## 2024-08-03 ENCOUNTER — Other Ambulatory Visit: Payer: Self-pay

## 2024-08-03 DIAGNOSIS — F321 Major depressive disorder, single episode, moderate: Secondary | ICD-10-CM

## 2024-08-04 ENCOUNTER — Other Ambulatory Visit: Payer: Self-pay | Admitting: Gerontology

## 2024-08-04 ENCOUNTER — Other Ambulatory Visit: Payer: Self-pay

## 2024-08-04 DIAGNOSIS — F321 Major depressive disorder, single episode, moderate: Secondary | ICD-10-CM

## 2024-08-04 MED ORDER — CITALOPRAM HYDROBROMIDE 20 MG PO TABS
20.0000 mg | ORAL_TABLET | Freq: Every day | ORAL | 0 refills | Status: DC
  Filled 2024-08-04: qty 30, 30d supply, fill #0

## 2024-08-04 MED ORDER — MIRTAZAPINE 15 MG PO TABS
15.0000 mg | ORAL_TABLET | Freq: Every day | ORAL | 0 refills | Status: DC
  Filled 2024-08-04: qty 30, 30d supply, fill #0

## 2024-08-05 ENCOUNTER — Other Ambulatory Visit: Payer: Self-pay

## 2024-08-12 ENCOUNTER — Other Ambulatory Visit: Payer: Self-pay

## 2024-08-12 ENCOUNTER — Ambulatory Visit: Payer: Self-pay | Admitting: Gerontology

## 2024-08-12 VITALS — BP 149/89 | HR 94 | Ht 70.0 in | Wt 180.1 lb

## 2024-08-12 DIAGNOSIS — F321 Major depressive disorder, single episode, moderate: Secondary | ICD-10-CM

## 2024-08-12 DIAGNOSIS — E785 Hyperlipidemia, unspecified: Secondary | ICD-10-CM

## 2024-08-12 DIAGNOSIS — R6882 Decreased libido: Secondary | ICD-10-CM | POA: Insufficient documentation

## 2024-08-12 MED ORDER — SIMVASTATIN 20 MG PO TABS
20.0000 mg | ORAL_TABLET | Freq: Every day | ORAL | 0 refills | Status: DC
  Filled 2024-08-12: qty 90, 90d supply, fill #0

## 2024-08-12 MED ORDER — CITALOPRAM HYDROBROMIDE 10 MG PO TABS
10.0000 mg | ORAL_TABLET | Freq: Every day | ORAL | 0 refills | Status: DC
  Filled 2024-08-12: qty 10, 10d supply, fill #0

## 2024-08-12 NOTE — Progress Notes (Unsigned)
   Established Patient Office Visit  Subjective   Patient ID: HULET EHRMANN, male    DOB: 1976-08-12  Age: 48 y.o. MRN: [id number]  Chief Complaint  Patient presents with   Follow-up   Medication Refill    HPI Travis Leach is a 48 y.o. male who has history of hypertension, smoking, asthma, alcohol use, depression, presents for routine follow-up visit and medication refill . He c/o sexual side effects like erectile dysfunction,states that he noticed it since he started a new relationship, states he goes to bed at 10 wakes up 1am then falls back to sleep, difficulty staying asleep  PHQ-9 = 3 GAD -7 = 1   ROS    Objective:     BP (!) 149/89 (BP Location: Left Arm, Patient Position: Sitting, Cuff Size: Small)   Pulse 94   Ht 5' 10 (1.778 m)   Wt 180 lb 1.6 oz (81.7 kg)   SpO2 97%   BMI 25.84 kg/m  BP Readings from Last 3 Encounters:  08/12/24 (!) 149/89  05/13/24 (!) 133/92  04/14/24 (!) 154/95   Wt Readings from Last 3 Encounters:  08/12/24 180 lb 1.6 oz (81.7 kg)  05/13/24 184 lb (83.5 kg)  04/14/24 176 lb 3.2 oz (79.9 kg)      Physical Exam   No results found for any visits on 08/12/24.  Last CBC Lab Results  Component Value Date   WBC 11.0 (H) 03/11/2024   HGB 15.6 03/11/2024   HCT 49.1 03/11/2024   MCV 86 03/11/2024   MCH 27.4 03/11/2024   RDW 12.6 03/11/2024   PLT 231 03/11/2024   Last metabolic panel Lab Results  Component Value Date   GLUCOSE 97 12/16/2023   NA 140 12/16/2023   K 4.6 12/16/2023   CL 103 12/16/2023   CO2 22 12/16/2023   BUN 7 12/16/2023   CREATININE 0.85 12/16/2023   EGFR 109 12/16/2023   CALCIUM 10.1 12/16/2023   PHOS 5.4 (H) 01/03/2022   PROT 7.5 12/16/2023   ALBUMIN 4.5 12/16/2023   LABGLOB 3.0 12/16/2023   AGRATIO 1.8 01/03/2022   BILITOT 0.2 12/16/2023   ALKPHOS 112 12/16/2023   AST 29 12/16/2023   ALT 40 12/16/2023   ANIONGAP 16 (H) 05/24/2023   Last lipids Lab Results  Component Value Date   CHOL 165  03/11/2024   HDL 44 03/11/2024   LDLCALC 105 (H) 03/11/2024   TRIG 86 03/11/2024   CHOLHDL 3.8 03/11/2024   Last hemoglobin A1c Lab Results  Component Value Date   HGBA1C 5.7 (A) 04/01/2024   Last thyroid functions Lab Results  Component Value Date   TSH 2.740 12/16/2023   Last vitamin D  Lab Results  Component Value Date   VD25OH 72.8 03/11/2024   Last vitamin B12 and Folate Lab Results  Component Value Date   VITAMINB12 1,137 01/03/2022   FOLATE >20.0 01/03/2022      The 10-year ASCVD risk score (Arnett DK, et al., 2019) is: 16.4%    Assessment & Plan:   Problem List Items Addressed This Visit   None   No follow-ups on file.    Kellar Westberg E Kassidy Dockendorf, NP

## 2024-08-12 NOTE — Patient Instructions (Signed)

## 2024-08-17 ENCOUNTER — Other Ambulatory Visit: Payer: Self-pay

## 2024-08-23 ENCOUNTER — Other Ambulatory Visit: Payer: Self-pay

## 2024-08-24 ENCOUNTER — Ambulatory Visit: Payer: Self-pay | Admitting: Gerontology

## 2024-08-24 ENCOUNTER — Other Ambulatory Visit: Payer: Self-pay

## 2024-08-24 DIAGNOSIS — R6882 Decreased libido: Secondary | ICD-10-CM

## 2024-08-24 DIAGNOSIS — I10 Essential (primary) hypertension: Secondary | ICD-10-CM

## 2024-08-24 DIAGNOSIS — F321 Major depressive disorder, single episode, moderate: Secondary | ICD-10-CM

## 2024-08-24 MED ORDER — CITALOPRAM HYDROBROMIDE 10 MG PO TABS
10.0000 mg | ORAL_TABLET | Freq: Every day | ORAL | 0 refills | Status: DC
  Filled 2024-08-24: qty 30, 30d supply, fill #0

## 2024-08-24 NOTE — Progress Notes (Signed)
 Established Patient Office Visit  Subjective   Patient ID: IDA UPPAL, male    DOB: 1976/01/06  Age: 48 y.o. MRN: [id number]  No chief complaint on file. 2 patient identifiers was used to identify patient.  HPI Travis Leach is a 48 y.o. male who has history of hypertension, smoking, asthma, alcohol use, depression, presents for routine follow-up visit. His citalopram  was decreased to 10 mg daily, though he states that he experiences intermittent erectile dysfunction and not delayed  ejaculation and orgasm.  He continues to smoke 1/2 pack of cigarette daily and admits the desire to quit.  His blood pressure is not under control, states that his mood is good denies suicidal or homicidal ideation.  Overall, he states that he is doing well and offers no further complaint.   Review of Systems  Constitutional: Negative.   Eyes: Negative.   Respiratory: Negative.    Cardiovascular: Negative.   Neurological: Negative.   Psychiatric/Behavioral: Negative.        Objective:     There were no vitals taken for this visit. BP Readings from Last 3 Encounters:  08/12/24 (!) 149/89  05/13/24 (!) 133/92  04/14/24 (!) 154/95   Wt Readings from Last 3 Encounters:  08/12/24 180 lb 1.6 oz (81.7 kg)  05/13/24 184 lb (83.5 kg)  04/14/24 176 lb 3.2 oz (79.9 kg)      Physical Exam   No results found for any visits on 08/24/24.  Last CBC Lab Results  Component Value Date   WBC 11.0 (H) 03/11/2024   HGB 15.6 03/11/2024   HCT 49.1 03/11/2024   MCV 86 03/11/2024   MCH 27.4 03/11/2024   RDW 12.6 03/11/2024   PLT 231 03/11/2024   Last metabolic panel Lab Results  Component Value Date   GLUCOSE 97 12/16/2023   NA 140 12/16/2023   K 4.6 12/16/2023   CL 103 12/16/2023   CO2 22 12/16/2023   BUN 7 12/16/2023   CREATININE 0.85 12/16/2023   EGFR 109 12/16/2023   CALCIUM 10.1 12/16/2023   PHOS 5.4 (H) 01/03/2022   PROT 7.5 12/16/2023   ALBUMIN 4.5 12/16/2023   LABGLOB 3.0  12/16/2023   AGRATIO 1.8 01/03/2022   BILITOT 0.2 12/16/2023   ALKPHOS 112 12/16/2023   AST 29 12/16/2023   ALT 40 12/16/2023   ANIONGAP 16 (H) 05/24/2023   Last lipids Lab Results  Component Value Date   CHOL 165 03/11/2024   HDL 44 03/11/2024   LDLCALC 105 (H) 03/11/2024   TRIG 86 03/11/2024   CHOLHDL 3.8 03/11/2024   Last hemoglobin A1c Lab Results  Component Value Date   HGBA1C 5.7 (A) 04/01/2024   Last thyroid functions Lab Results  Component Value Date   TSH 2.740 12/16/2023   Last vitamin D  Lab Results  Component Value Date   VD25OH 72.8 03/11/2024   Last vitamin B12 and Folate Lab Results  Component Value Date   VITAMINB12 1,137 01/03/2022   FOLATE >20.0 01/03/2022      The 10-year ASCVD risk score (Arnett DK, et al., 2019) is: 16.4%    Assessment & Plan:   1. Decreased libido without sexual dysfunction - Due to the patient's complaint of intermittent erectile dysfunction, we will check testosterone at his next visit.  2. Depression, major, single episode, moderate (HCC) (Primary) -His depression is under control, he will continue current medication and advised to notify clinic, and call the crisis helpline for worsening symptoms. - citalopram  (CELEXA ) 10 MG  tablet; Take 1 tablet (10 mg total) by mouth daily.  Dispense: 30 tablet; Refill: 0  3. Hypertension, unspecified type -Blood pressure is not under control he was strongly encouraged on smoking cessation, adherent to medication, DASH diet and exercise as tolerated.   Return in about 2 weeks (around 09/07/2024), or if symptoms worsen or fail to improve.    Jasn Xia E Daveyon Kitchings, NP

## 2024-09-03 ENCOUNTER — Other Ambulatory Visit: Payer: Self-pay

## 2024-09-07 ENCOUNTER — Other Ambulatory Visit: Payer: Self-pay

## 2024-09-07 ENCOUNTER — Ambulatory Visit: Payer: Self-pay | Admitting: Gerontology

## 2024-09-07 DIAGNOSIS — F321 Major depressive disorder, single episode, moderate: Secondary | ICD-10-CM

## 2024-09-07 DIAGNOSIS — R6882 Decreased libido: Secondary | ICD-10-CM

## 2024-09-07 DIAGNOSIS — E785 Hyperlipidemia, unspecified: Secondary | ICD-10-CM

## 2024-09-07 MED ORDER — MIRTAZAPINE 15 MG PO TABS
15.0000 mg | ORAL_TABLET | Freq: Every day | ORAL | 0 refills | Status: AC
  Filled 2024-09-07 – 2024-11-01 (×2): qty 30, 30d supply, fill #0

## 2024-09-07 MED ORDER — CITALOPRAM HYDROBROMIDE 10 MG PO TABS
10.0000 mg | ORAL_TABLET | Freq: Every day | ORAL | 0 refills | Status: AC
  Filled 2024-09-07: qty 30, 30d supply, fill #0

## 2024-09-07 MED ORDER — SIMVASTATIN 20 MG PO TABS
20.0000 mg | ORAL_TABLET | Freq: Every day | ORAL | 0 refills | Status: AC
  Filled 2024-09-07: qty 30, 30d supply, fill #0

## 2024-09-07 NOTE — Progress Notes (Unsigned)
   Established Patient Office Visit  Subjective   Patient ID: AMAR SIPPEL, male    DOB: 09/15/1976  Age: 48 y.o. MRN: [id number]  No chief complaint on file. 2 patient identifiers was used to identify patient   HPI TAEDEN GELLER is a 48 y.o. male who has history of hypertension, smoking, asthma, alcohol use, depression, presents for routine follow-up visit His citalopram  was decreased to 10 mg daily, though he states that he experiences intermittent erectile dysfunction and not delayed  ejaculation and orgasm.  He continues to experience difficulty achieving or maintaing wrection, will continue on 10 mg citalopram . He states that his mood is good, denies suicidal nor homicidal ideation.   Review of Systems  Constitutional: Negative.   Respiratory: Negative.    Cardiovascular: Negative.   Genitourinary: Negative.   Neurological: Negative.   Psychiatric/Behavioral: Negative.        Objective:     There were no vitals taken for this visit. BP Readings from Last 3 Encounters:  08/12/24 (!) 149/89  05/13/24 (!) 133/92  04/14/24 (!) 154/95   Wt Readings from Last 3 Encounters:  08/12/24 180 lb 1.6 oz (81.7 kg)  05/13/24 184 lb (83.5 kg)  04/14/24 176 lb 3.2 oz (79.9 kg)      Physical Exam   No results found for any visits on 09/07/24.  Last CBC Lab Results  Component Value Date   WBC 11.0 (H) 03/11/2024   HGB 15.6 03/11/2024   HCT 49.1 03/11/2024   MCV 86 03/11/2024   MCH 27.4 03/11/2024   RDW 12.6 03/11/2024   PLT 231 03/11/2024   Last metabolic panel Lab Results  Component Value Date   GLUCOSE 97 12/16/2023   NA 140 12/16/2023   K 4.6 12/16/2023   CL 103 12/16/2023   CO2 22 12/16/2023   BUN 7 12/16/2023   CREATININE 0.85 12/16/2023   EGFR 109 12/16/2023   CALCIUM 10.1 12/16/2023   PHOS 5.4 (H) 01/03/2022   PROT 7.5 12/16/2023   ALBUMIN 4.5 12/16/2023   LABGLOB 3.0 12/16/2023   AGRATIO 1.8 01/03/2022   BILITOT 0.2 12/16/2023   ALKPHOS 112  12/16/2023   AST 29 12/16/2023   ALT 40 12/16/2023   ANIONGAP 16 (H) 05/24/2023   Last lipids Lab Results  Component Value Date   CHOL 165 03/11/2024   HDL 44 03/11/2024   LDLCALC 105 (H) 03/11/2024   TRIG 86 03/11/2024   CHOLHDL 3.8 03/11/2024   Last hemoglobin A1c Lab Results  Component Value Date   HGBA1C 5.7 (A) 04/01/2024   Last thyroid functions Lab Results  Component Value Date   TSH 2.740 12/16/2023   Last vitamin D  Lab Results  Component Value Date   VD25OH 72.8 03/11/2024   Last vitamin B12 and Folate Lab Results  Component Value Date   VITAMINB12 1,137 01/03/2022   FOLATE >20.0 01/03/2022      The 10-year ASCVD risk score (Arnett DK, et al., 2019) is: 16.4%    Assessment & Plan:   Problem List Items Addressed This Visit   None   No follow-ups on file.    Marjie Chea E Kenyata Guess, NP

## 2024-09-14 ENCOUNTER — Other Ambulatory Visit: Payer: Self-pay

## 2024-09-24 ENCOUNTER — Inpatient Hospital Stay
Admission: EM | Admit: 2024-09-24 | Discharge: 2024-09-29 | DRG: 871 | Disposition: A | Discharge status: Home / Self Care | Payer: MEDICAID | Attending: Internal Medicine | Admitting: Internal Medicine

## 2024-09-24 ENCOUNTER — Emergency Department: Payer: MEDICAID

## 2024-09-24 ENCOUNTER — Other Ambulatory Visit: Payer: Self-pay

## 2024-09-24 ENCOUNTER — Encounter: Payer: Self-pay | Admitting: Emergency Medicine

## 2024-09-24 DIAGNOSIS — R0602 Shortness of breath: Secondary | ICD-10-CM | POA: Diagnosis present

## 2024-09-24 DIAGNOSIS — I10 Essential (primary) hypertension: Secondary | ICD-10-CM | POA: Diagnosis present

## 2024-09-24 DIAGNOSIS — Z1152 Encounter for screening for COVID-19: Secondary | ICD-10-CM | POA: Diagnosis not present

## 2024-09-24 DIAGNOSIS — J9601 Acute respiratory failure with hypoxia: Secondary | ICD-10-CM | POA: Diagnosis present

## 2024-09-24 DIAGNOSIS — F1410 Cocaine abuse, uncomplicated: Secondary | ICD-10-CM | POA: Diagnosis present

## 2024-09-24 DIAGNOSIS — Z91030 Bee allergy status: Secondary | ICD-10-CM | POA: Diagnosis not present

## 2024-09-24 DIAGNOSIS — A481 Legionnaires' disease: Secondary | ICD-10-CM | POA: Diagnosis present

## 2024-09-24 DIAGNOSIS — F1210 Cannabis abuse, uncomplicated: Secondary | ICD-10-CM | POA: Diagnosis present

## 2024-09-24 DIAGNOSIS — F17210 Nicotine dependence, cigarettes, uncomplicated: Secondary | ICD-10-CM | POA: Diagnosis present

## 2024-09-24 DIAGNOSIS — F1010 Alcohol abuse, uncomplicated: Secondary | ICD-10-CM | POA: Diagnosis present

## 2024-09-24 DIAGNOSIS — E861 Hypovolemia: Secondary | ICD-10-CM | POA: Diagnosis present

## 2024-09-24 DIAGNOSIS — A419 Sepsis, unspecified organism: Principal | ICD-10-CM | POA: Diagnosis present

## 2024-09-24 DIAGNOSIS — Z79899 Other long term (current) drug therapy: Secondary | ICD-10-CM

## 2024-09-24 DIAGNOSIS — J45909 Unspecified asthma, uncomplicated: Secondary | ICD-10-CM | POA: Diagnosis present

## 2024-09-24 DIAGNOSIS — F32A Depression, unspecified: Secondary | ICD-10-CM | POA: Diagnosis present

## 2024-09-24 DIAGNOSIS — R0902 Hypoxemia: Secondary | ICD-10-CM

## 2024-09-24 DIAGNOSIS — E876 Hypokalemia: Secondary | ICD-10-CM | POA: Diagnosis present

## 2024-09-24 DIAGNOSIS — N179 Acute kidney failure, unspecified: Secondary | ICD-10-CM | POA: Diagnosis present

## 2024-09-24 DIAGNOSIS — R1912 Hyperactive bowel sounds: Secondary | ICD-10-CM | POA: Diagnosis present

## 2024-09-24 DIAGNOSIS — F17200 Nicotine dependence, unspecified, uncomplicated: Secondary | ICD-10-CM | POA: Diagnosis present

## 2024-09-24 DIAGNOSIS — Z833 Family history of diabetes mellitus: Secondary | ICD-10-CM

## 2024-09-24 DIAGNOSIS — E871 Hypo-osmolality and hyponatremia: Secondary | ICD-10-CM | POA: Diagnosis present

## 2024-09-24 DIAGNOSIS — J4520 Mild intermittent asthma, uncomplicated: Secondary | ICD-10-CM

## 2024-09-24 DIAGNOSIS — E785 Hyperlipidemia, unspecified: Secondary | ICD-10-CM | POA: Diagnosis present

## 2024-09-24 DIAGNOSIS — R197 Diarrhea, unspecified: Secondary | ICD-10-CM | POA: Diagnosis present

## 2024-09-24 DIAGNOSIS — F321 Major depressive disorder, single episode, moderate: Secondary | ICD-10-CM

## 2024-09-24 DIAGNOSIS — F329 Major depressive disorder, single episode, unspecified: Secondary | ICD-10-CM | POA: Diagnosis present

## 2024-09-24 DIAGNOSIS — E860 Dehydration: Secondary | ICD-10-CM | POA: Diagnosis present

## 2024-09-24 DIAGNOSIS — J189 Pneumonia, unspecified organism: Principal | ICD-10-CM | POA: Diagnosis present

## 2024-09-24 DIAGNOSIS — A0811 Acute gastroenteropathy due to Norwalk agent: Secondary | ICD-10-CM

## 2024-09-24 LAB — BASIC METABOLIC PANEL WITH GFR
Anion gap: 15 (ref 5–15)
Anion gap: 15 (ref 5–15)
BUN: 25 mg/dL — ABNORMAL HIGH (ref 6–20)
BUN: 24 mg/dL — ABNORMAL HIGH (ref 6–20)
CO2: 22 mmol/L (ref 22–32)
CO2: 23 mmol/L (ref 22–32)
Calcium: 8.9 mg/dL (ref 8.9–10.3)
Calcium: 8.3 mg/dL — ABNORMAL LOW (ref 8.9–10.3)
Chloride: 88 mmol/L — ABNORMAL LOW (ref 98–111)
Chloride: 90 mmol/L — ABNORMAL LOW (ref 98–111)
Creatinine, Ser: 1.55 mg/dL — ABNORMAL HIGH (ref 0.61–1.24)
Creatinine, Ser: 1.45 mg/dL — ABNORMAL HIGH (ref 0.61–1.24)
GFR, Estimated: 55 mL/min — ABNORMAL LOW
GFR, Estimated: 60 mL/min — ABNORMAL LOW
Glucose, Bld: 126 mg/dL — ABNORMAL HIGH (ref 70–99)
Glucose, Bld: 111 mg/dL — ABNORMAL HIGH (ref 70–99)
Potassium: 4.1 mmol/L (ref 3.5–5.1)
Potassium: 3.7 mmol/L (ref 3.5–5.1)
Sodium: 124 mmol/L — ABNORMAL LOW (ref 135–145)
Sodium: 127 mmol/L — ABNORMAL LOW (ref 135–145)

## 2024-09-24 LAB — STREP PNEUMONIAE URINARY ANTIGEN: Strep Pneumo Urinary Antigen: NEGATIVE

## 2024-09-24 LAB — RESP PANEL BY RT-PCR (RSV, FLU A&B, COVID)  RVPGX2
Influenza A by PCR: NEGATIVE
Influenza B by PCR: NEGATIVE
Resp Syncytial Virus by PCR: NEGATIVE
SARS Coronavirus 2 by RT PCR: NEGATIVE

## 2024-09-24 LAB — CBC
HCT: 40.3 % (ref 39.0–52.0)
Hemoglobin: 13.4 g/dL (ref 13.0–17.0)
MCH: 25.9 pg — ABNORMAL LOW (ref 26.0–34.0)
MCHC: 33.3 g/dL (ref 30.0–36.0)
MCV: 77.9 fL — ABNORMAL LOW (ref 80.0–100.0)
Platelets: 220 K/uL (ref 150–400)
RBC: 5.17 MIL/uL (ref 4.22–5.81)
RDW: 13.2 % (ref 11.5–15.5)
WBC: 9 K/uL (ref 4.0–10.5)
nRBC: 0 % (ref 0.0–0.2)

## 2024-09-24 LAB — PHOSPHORUS: Phosphorus: 2.4 mg/dL — ABNORMAL LOW (ref 2.5–4.6)

## 2024-09-24 LAB — MAGNESIUM: Magnesium: 2.2 mg/dL (ref 1.7–2.4)

## 2024-09-24 LAB — LACTIC ACID, PLASMA: Lactic Acid, Venous: 1.6 mmol/L (ref 0.5–1.9)

## 2024-09-24 LAB — SODIUM, URINE, RANDOM: Sodium, Ur: <30 mmol/L

## 2024-09-24 LAB — OSMOLALITY, URINE: Osmolality, Ur: 508 mosm/kg (ref 300–900)

## 2024-09-24 MED ORDER — POTASSIUM PHOSPHATES 15 MMOLE/5ML IV SOLN
15.0000 mmol | Freq: Once | INTRAVENOUS | Status: AC
  Administered 2024-09-25: 15 mmol via INTRAVENOUS
  Filled 2024-09-24: qty 5

## 2024-09-24 MED ORDER — CITALOPRAM HYDROBROMIDE 20 MG PO TABS
10.0000 mg | ORAL_TABLET | Freq: Every day | ORAL | Status: DC
  Administered 2024-09-25 – 2024-09-29 (×5): 10 mg via ORAL
  Filled 2024-09-24 (×3): qty 1

## 2024-09-24 MED ORDER — DM-GUAIFENESIN ER 30-600 MG PO TB12
1.0000 | ORAL_TABLET | Freq: Two times a day (BID) | ORAL | Status: DC | PRN
  Administered 2024-09-25 – 2024-09-29 (×3): 1 via ORAL
  Filled 2024-09-24 (×2): qty 1

## 2024-09-24 MED ORDER — FOLIC ACID 1 MG PO TABS
1.0000 mg | ORAL_TABLET | Freq: Every day | ORAL | Status: DC
  Administered 2024-09-24 – 2024-09-29 (×6): 1 mg via ORAL
  Filled 2024-09-24 (×4): qty 1

## 2024-09-24 MED ORDER — SODIUM CHLORIDE 0.9 % IV SOLN
1.0000 g | Freq: Once | INTRAVENOUS | Status: AC
  Administered 2024-09-24: 1 g via INTRAVENOUS
  Filled 2024-09-24: qty 10

## 2024-09-24 MED ORDER — ADULT MULTIVITAMIN W/MINERALS CH
1.0000 | ORAL_TABLET | Freq: Every day | ORAL | Status: DC
  Administered 2024-09-24 – 2024-09-29 (×6): 1 via ORAL
  Filled 2024-09-24 (×4): qty 1

## 2024-09-24 MED ORDER — ENOXAPARIN SODIUM 40 MG/0.4ML IJ SOSY
40.0000 mg | PREFILLED_SYRINGE | INTRAMUSCULAR | Status: DC
  Administered 2024-09-24 – 2024-09-27 (×4): 40 mg via SUBCUTANEOUS
  Filled 2024-09-24 (×4): qty 0.4

## 2024-09-24 MED ORDER — AMLODIPINE BESYLATE 5 MG PO TABS
10.0000 mg | ORAL_TABLET | Freq: Every day | ORAL | Status: DC
  Administered 2024-09-25: 10 mg via ORAL
  Filled 2024-09-24: qty 2

## 2024-09-24 MED ORDER — SIMVASTATIN 10 MG PO TABS: 20.0000 mg | ORAL_TABLET | Freq: Every day | ORAL | Status: DC

## 2024-09-24 MED ORDER — SODIUM CHLORIDE 0.9 % IV SOLN: 500.0000 mg | INTRAVENOUS | Status: DC

## 2024-09-24 MED ORDER — NICOTINE 21 MG/24HR TD PT24
21.0000 mg | MEDICATED_PATCH | Freq: Every day | TRANSDERMAL | Status: DC
  Administered 2024-09-24 – 2024-09-29 (×6): 21 mg via TRANSDERMAL
  Filled 2024-09-24 (×4): qty 1

## 2024-09-24 MED ORDER — SODIUM CHLORIDE 0.9 % IV BOLUS: 1000.0000 mL | Freq: Once | INTRAVENOUS | Status: DC

## 2024-09-24 MED ORDER — THIAMINE MONONITRATE 100 MG PO TABS
100.0000 mg | ORAL_TABLET | Freq: Every day | ORAL | Status: DC
  Administered 2024-09-24 – 2024-09-29 (×6): 100 mg via ORAL
  Filled 2024-09-24 (×4): qty 1

## 2024-09-24 MED ORDER — SODIUM CHLORIDE 0.9 % IV SOLN: INTRAVENOUS | Status: DC

## 2024-09-24 MED ORDER — LORAZEPAM 2 MG/ML IJ SOLN: 0.0000 mg | Freq: Two times a day (BID) | INTRAMUSCULAR | Status: DC

## 2024-09-24 MED ORDER — LACTATED RINGERS IV BOLUS
1000.0000 mL | Freq: Once | INTRAVENOUS | Status: AC
  Administered 2024-09-24: 1000 mL via INTRAVENOUS

## 2024-09-24 MED ORDER — SODIUM CHLORIDE 0.9 % IV SOLN
1.0000 g | INTRAVENOUS | Status: AC
  Administered 2024-09-25 – 2024-09-28 (×4): 1 g via INTRAVENOUS
  Filled 2024-09-24 (×3): qty 10

## 2024-09-24 MED ORDER — SODIUM CHLORIDE 1 G PO TABS
1.0000 g | ORAL_TABLET | Freq: Two times a day (BID) | ORAL | Status: DC
  Administered 2024-09-25 – 2024-09-29 (×10): 1 g via ORAL
  Filled 2024-09-24 (×8): qty 1

## 2024-09-24 MED ORDER — LORAZEPAM 2 MG/ML IJ SOLN: 0.0000 mg | Freq: Four times a day (QID) | INTRAMUSCULAR | Status: DC

## 2024-09-24 MED ORDER — SODIUM CHLORIDE 0.9 % IV SOLN
500.0000 mg | Freq: Once | INTRAVENOUS | Status: AC
  Administered 2024-09-24: 500 mg via INTRAVENOUS
  Filled 2024-09-24: qty 5

## 2024-09-24 MED ORDER — ACETAMINOPHEN 325 MG PO TABS
650.0000 mg | ORAL_TABLET | Freq: Four times a day (QID) | ORAL | Status: DC | PRN
  Administered 2024-09-24 – 2024-09-28 (×5): 650 mg via ORAL
  Filled 2024-09-24 (×3): qty 2

## 2024-09-24 MED ORDER — ALBUTEROL SULFATE HFA 108 (90 BASE) MCG/ACT IN AERS: 2.0000 | INHALATION_SPRAY | RESPIRATORY_TRACT | Status: DC | PRN

## 2024-09-24 MED ORDER — LORAZEPAM 1 MG PO TABS: 1.0000 mg | ORAL_TABLET | ORAL | Status: DC | PRN

## 2024-09-24 MED ORDER — HYDRALAZINE HCL 20 MG/ML IJ SOLN: 5.0000 mg | INTRAMUSCULAR | Status: DC | PRN

## 2024-09-24 MED ORDER — IPRATROPIUM-ALBUTEROL 0.5-2.5 (3) MG/3ML IN SOLN
3.0000 mL | RESPIRATORY_TRACT | Status: DC
  Administered 2024-09-24 (×2): 3 mL via RESPIRATORY_TRACT
  Filled 2024-09-24 (×2): qty 3

## 2024-09-24 MED ORDER — SODIUM CHLORIDE 0.9 % IV BOLUS
500.0000 mL | Freq: Once | INTRAVENOUS | Status: AC
  Administered 2024-09-24: 500 mL via INTRAVENOUS

## 2024-09-24 MED ORDER — ONDANSETRON HCL 4 MG/2ML IJ SOLN: 4.0000 mg | Freq: Three times a day (TID) | INTRAMUSCULAR | Status: DC | PRN

## 2024-09-24 MED ORDER — ALBUTEROL SULFATE (2.5 MG/3ML) 0.083% IN NEBU: 2.5000 mg | INHALATION_SOLUTION | RESPIRATORY_TRACT | Status: DC | PRN

## 2024-09-24 MED ORDER — THIAMINE HCL 100 MG/ML IJ SOLN: 100.0000 mg | Freq: Every day | INTRAMUSCULAR | Status: DC

## 2024-09-24 MED ORDER — MIRTAZAPINE 15 MG PO TABS
15.0000 mg | ORAL_TABLET | Freq: Every day | ORAL | Status: DC
  Administered 2024-09-25 – 2024-09-28 (×4): 15 mg via ORAL
  Filled 2024-09-24 (×3): qty 1

## 2024-09-24 MED ORDER — LORAZEPAM 2 MG/ML IJ SOLN: 1.0000 mg | INTRAMUSCULAR | Status: DC | PRN

## 2024-09-24 NOTE — ED Triage Notes (Signed)
 Pt reports SOB and cough all week. Right sided ear pain. RA sats 88%, no relief with home inahler.

## 2024-09-24 NOTE — ED Provider Notes (Signed)
 "  [facility] Provider Note    Event Date/Time   First MD Initiated Contact with Patient 09/24/24 1619     (approximate)   History   Cough and Shortness of Breath   HPI  Travis Leach is a 48 y.o. male who presents to the emergency department today because of concerns for cough and shortness of breath.  Symptoms started 5 days ago.  They have been gradually getting worse.  This has been accompanied by weakness and fatigue.  The patient denies any sick contacts or travel prior to the symptoms starting.  Does state he has a history of asthma.  He never had to be put on oxygen before.  He has had some fevers and chills.     Physical Exam   Triage Vital Signs: ED Triage Vitals  Encounter Vitals Group     BP 09/24/24 1610 (!) 148/89     Girls Systolic BP Percentile --      Girls Diastolic BP Percentile --      Boys Systolic BP Percentile --      Boys Diastolic BP Percentile --      Pulse Rate 09/24/24 1610 (!) 134     Resp 09/24/24 1610 (!) 24     Temp --      Temp src --      SpO2 09/24/24 1610 (!) 88 %     Weight 09/24/24 1610 185 lb (83.9 kg)     Height 09/24/24 1610 5' 10 (1.778 m)     Head Circumference --      Peak Flow --      Pain Score 09/24/24 1609 8     Pain Loc --      Pain Education --      Exclude from Growth Chart --     Most recent vital signs: Vitals:   09/24/24 1610  BP: (!) 148/89  Pulse: (!) 134  Resp: (!) 24  SpO2: (!) 88%   General: Awake, alert, oriented. CV:  Good peripheral perfusion. Tachycardia. Resp:  Increased work of breathing. Diminished breath sounds in right lower lung. No wheezing. Abd:  No distention.    ED Results / Procedures / Treatments   Labs (all labs ordered are listed, but only abnormal results are displayed) Labs Reviewed  BASIC METABOLIC PANEL WITH GFR - Abnormal; Notable for the following components:      Result Value   Sodium 124 (*)    Chloride 88 (*)    Glucose, Bld 126 (*)    BUN  25 (*)    Creatinine, Ser 1.55 (*)    GFR, Estimated 55 (*)    All other components within normal limits  CBC - Abnormal; Notable for the following components:   MCV 77.9 (*)    MCH 25.9 (*)    All other components within normal limits  RESP PANEL BY RT-PCR (RSV, FLU A&B, COVID)  [id number]  CULTURE, BLOOD (ROUTINE X 2)  CULTURE, BLOOD (ROUTINE X 2)  RESPIRATORY PANEL BY PCR  EXPECTORATED SPUTUM ASSESSMENT W GRAM STAIN, RFLX TO RESP C  C DIFFICILE QUICK SCREEN W PCR REFLEX    GASTROINTESTINAL PANEL BY PCR, STOOL (REPLACES STOOL CULTURE)  LACTIC ACID, PLASMA  MAGNESIUM   PHOSPHORUS  OSMOLALITY  OSMOLALITY, URINE  SODIUM, URINE, RANDOM  BASIC METABOLIC PANEL WITH GFR  BASIC METABOLIC PANEL WITH GFR  HIV ANTIBODY (ROUTINE TESTING W REFLEX)  STREP PNEUMONIAE URINARY ANTIGEN  LEGIONELLA PNEUMOPHILA SEROGP 1 UR AG  CBC     EKG  I, Guadalupe Eagles, attending physician, personally viewed and interpreted this EKG  EKG Time: 1615 Rate: 128 Rhythm: sinus tachycardia Axis: normal Intervals: qtc 420 QRS: narrow ST changes: no st elevation Impression: abnormal ekg  RADIOLOGY I independently interpreted and visualized the CXR. My interpretation: right lower lobe pneumonia Radiology interpretation:  IMPRESSION:  1. Hazy opacity at the right lung base, worrisome for pneumonia.  2. Possible small right pleural effusion.     PROCEDURES:  Critical Care performed: Yes  CRITICAL CARE Performed by: Guadalupe Eagles   Total critical care time: 30 minutes  Critical care time was exclusive of separately billable procedures and treating other patients.  Critical care was necessary to treat or prevent imminent or life-threatening deterioration.  Critical care was time spent personally by me on the following activities: development of treatment plan with patient and/or surrogate as well as nursing, discussions with consultants, evaluation of patient's response to treatment, examination  of patient, obtaining history from patient or surrogate, ordering and performing treatments and interventions, ordering and review of laboratory studies, ordering and review of radiographic studies, pulse oximetry and re-evaluation of patient's condition.   Procedures    MEDICATIONS ORDERED IN ED: Medications - No data to display   IMPRESSION / MDM / ASSESSMENT AND PLAN / ED COURSE  I reviewed the triage vital signs and the nursing notes.                              Differential diagnosis includes, but is not limited to, pneumonia, pneumothorax, PE, viral illness  Patient's presentation is most consistent with acute presentation with potential threat to life or bodily function.   The patient is on the cardiac monitor to evaluate for evidence of arrhythmia and/or significant heart rate changes.  Patient presented to the emergency department today because of concerns for shortness of breath and cough for the past 5 days.  Upon arrival to the emergency department patient was found to be hypoxic tachypneic and tachycardic.  Patient did have diminished breath sounds in the right lower lobe on auscultation.  No wheezing appreciated.  Chest x-ray is consistent with a pneumonia.  I do think this could explain the patient's symptoms and hypoxia.  Will start broad-spectrum IV antibiotics.  Blood work without leukocytosis or elevated lactic acid level.  Discussed findings with patient.  Discussed with Dr. Hilma with the hospitalist service who will evaluate for admission.      FINAL CLINICAL IMPRESSION(S) / ED DIAGNOSES   Final diagnoses:  Shortness of breath  Hypoxia  Community acquired pneumonia of right lower lobe of lung  Hyponatremia     Note:  This document was prepared using Dragon voice recognition software and may include unintentional dictation errors.    Eagles Guadalupe, MD 09/24/24 1920  "

## 2024-09-24 NOTE — ED Notes (Signed)
 Pt placed on 2L NC

## 2024-09-24 NOTE — H&P (Addendum)
 " History and Physical    Travis Leach FMW:[id number] DOB: [birth year] DOA: 09/24/2024  Referring MD/NP/PA:   PCP: Iloabachie, Chioma E, NP   Patient coming from:  The patient is coming from home.     Chief Complaint: SOB and diarrhea  HPI: Travis Leach is a 48 y.o. male with medical history significant of polysubstance abuse (cocaine, marijuana, tobacco and alcohol), HTN,  HLD, asthma, depression, delirium treatment, pancreatitis, hyponatremia, decreased libido, who presents with SOB and diarrhea.  Patient states he has been sick for more than 5 days.  Symptoms include productive cough with yellow-colored sputum production, mild subjective fever, chills, SOB which has been progressively worsening.  No chest pain.  He also reports generalized weakness and fatigue.  No sore throat.  He has nausea and diarrhea, no vomiting or abdominal pain.  No symptoms of UTI.  Patient states that he has many episodes of watery diarrhea each day in the past 5 days.  Data reviewed independently and ED Course: pt was found to have WBC 9.0, negative PCR for COVID, flu and RSV, AKI, sodium 124, lactic acid 1.6. Temperature 99.5, blood pressure 150/89, heart rate 110-130s, RR 24, oxygen saturation 88% room air, which improved to 95% on 2 L oxygen (patient is not using oxygen at hide baseline).  Chest x-ray showed right basilar infiltration.  Patient is admitted to telemetry bed as inpatient.  EKG: I have personally reviewed.  Sinus rhythm, QTc 420, LAE, nonspecific T wave change.   Review of Systems:   General: Has subjective fevers, chills, no body weight gain, has poor appetite, has fatigue HEENT: no blurry vision, hearing changes or sore throat Respiratory: has dyspnea, coughing, no wheezing CV: no chest pain, no palpitations GI: Has nausea and diarrhea, no vomiting, abdominal pain, constipation GU: no dysuria, burning on urination, increased urinary frequency, hematuria  Ext: no leg edema Neuro: no  unilateral weakness, numbness, or tingling, no vision change or hearing loss Skin: no rash, no skin tear. MSK: No muscle spasm, no deformity, no limitation of range of movement in spin Heme: No easy bruising.  Travel history: No recent long distant travel.   Allergy: Allergies[1]  Past Medical History:  Diagnosis Date   Asthma    Depression    Hypertension    Pancreatitis     Past Surgical History:  Procedure Laterality Date   NO PAST SURGERIES      Social History:  reports that he has been smoking cigarettes. He has a 7.5 pack-year smoking history. He has never used smokeless tobacco. He reports current alcohol use. He reports current drug use. Frequency: 2.00 times per week. Drugs: Marijuana and Codeine.  Family History:  Family History  Problem Relation Age of Onset   Diabetes Mother    Pancreatitis Mother    Healthy Father    Healthy Sister    Other Maternal Grandmother        Patient must keep OV with Powell and Elizabeth on 08/14/22. No more medication will be sent in for patient.   Other Maternal Grandfather        Patient must keep OV with Powell and Almarie on 08/14/22. No more medication will be sent in for patient.   Dementia Paternal Grandmother    Other Paternal Grandfather        Patient must keep OV with Powell and Almarie on 08/14/22. No more medication will be sent in for patient.     Prior to Admission medications  Medication Sig  Start Date End Date Taking? Authorizing Provider  albuterol  (VENTOLIN  HFA) 108 (90 Base) MCG/ACT inhaler Inhale 2 puffs into the lungs every 6 (six) hours as needed for wheezing or shortness of breath. 12/17/23   Iloabachie, Chioma E, NP  amLODipine  (NORVASC ) 10 MG tablet Take 1 tablet (10 mg total) by mouth once daily. 03/11/24   Iloabachie, Chioma E, NP  citalopram  (CELEXA ) 10 MG tablet Take 1 tablet (10 mg total) by mouth daily. 09/07/24   Iloabachie, Chioma E, NP  folic acid  (FOLVITE ) 1 MG tablet Take 1 tablet (1 mg  total) by mouth once daily. 12/17/23   Iloabachie, Chioma E, NP  mirtazapine  (REMERON ) 15 MG tablet Take 1 tablet (15 mg total) by mouth at bedtime. 09/07/24   Iloabachie, Chioma E, NP  simvastatin  (ZOCOR ) 20 MG tablet Take 1 tablet (20 mg total) by mouth daily. 09/07/24   Iloabachie, Chioma E, NP  thiamine  (VITAMIN B1) 100 MG tablet Take 1 tablet (100 mg total) by mouth daily. 12/17/23   Iloabachie, Chioma E, NP    Physical Exam: Vitals:   09/24/24 1912 09/24/24 2001 09/24/24 2029 09/24/24 2127  BP:  139/78  120/68  Pulse:  90  98  Resp:  16  16  Temp:  (!) 103.1 F (39.5 C)  (!) 100.4 F (38 C)  TempSrc: Oral Oral    SpO2:  93% 93% 94%  Weight:      Height:       General: Not in acute distress.  Dry mucous membrane. HEENT:       Eyes: PERRL, EOMI, no jaundice       ENT: No discharge from the ears and nose, no pharynx injection, no tonsillar enlargement.        Neck: No JVD, no bruit, no mass felt. Heme: No neck lymph node enlargement. Cardiac: S1/S2, RRR, No murmurs, No gallops or rubs. Respiratory: No rales, wheezing, rhonchi or rubs. GI: Soft, nondistended, nontender, no rebound pain, no organomegaly, BS present. GU: No hematuria Ext: No pitting leg edema bilaterally. 1+DP/PT pulse bilaterally. Musculoskeletal: No joint deformities, No joint redness or warmth, no limitation of ROM in spin. Skin: No rashes.  Neuro: Alert, oriented X3, cranial nerves II-XII grossly intact, moves all extremities normally. Psych: Patient is not psychotic, no suicidal or hemocidal ideation.  Labs on Admission: I have personally reviewed following labs and imaging studies  CBC: Recent Labs  Lab 09/24/24 1652  WBC 9.0  HGB 13.4  HCT 40.3  MCV 77.9*  PLT 220   Basic Metabolic Panel: Recent Labs  Lab 09/24/24 1652 09/24/24 2023  NA 124* 127*  K 4.1 3.7  CL 88* 90*  CO2 22 23  GLUCOSE 126* 111*  BUN 25* 24*  CREATININE 1.55* 1.45*  CALCIUM 8.9 8.3*  MG  --  2.2  PHOS  --  2.4*    GFR: Estimated Creatinine Clearance: 65 mL/min (A) (by C-G formula based on SCr of 1.45 mg/dL (H)). Liver Function Tests: No results for input(s): AST, ALT, ALKPHOS, BILITOT, PROT, ALBUMIN in the last 168 hours. No results for input(s): LIPASE, AMYLASE in the last 168 hours. No results for input(s): AMMONIA in the last 168 hours. Coagulation Profile: No results for input(s): INR, PROTIME in the last 168 hours. Cardiac Enzymes: No results for input(s): CKTOTAL, CKMB, CKMBINDEX, TROPONINI in the last 168 hours. BNP (last 3 results) No results for input(s): PROBNP in the last 8760 hours. HbA1C: No results for input(s): HGBA1C in the last 72  hours. CBG: No results for input(s): GLUCAP in the last 168 hours. Lipid Profile: No results for input(s): CHOL, HDL, LDLCALC, TRIG, CHOLHDL, LDLDIRECT in the last 72 hours. Thyroid Function Tests: No results for input(s): TSH, T4TOTAL, FREET4, T3FREE, THYROIDAB in the last 72 hours. Anemia Panel: No results for input(s): VITAMINB12, FOLATE, FERRITIN, TIBC, IRON, RETICCTPCT in the last 72 hours. Urine analysis:    Component Value Date/Time   COLORURINE YELLOW 05/24/2023 1258   APPEARANCEUR CLEAR 05/24/2023 1258   APPEARANCEUR Cloudy (A) 01/03/2022 1329   LABSPEC 1.032 (H) 05/24/2023 1258   PHURINE 5.0 05/24/2023 1258   GLUCOSEU NEGATIVE 05/24/2023 1258   HGBUR NEGATIVE 05/24/2023 1258   BILIRUBINUR NEGATIVE 05/24/2023 1258   BILIRUBINUR Negative 01/03/2022 1329   KETONESUR 5 (A) 05/24/2023 1258   PROTEINUR 100 (A) 05/24/2023 1258   NITRITE NEGATIVE 05/24/2023 1258   LEUKOCYTESUR NEGATIVE 05/24/2023 1258   Sepsis Labs: @LABRCNTIP (procalcitonin:4,lacticidven:4) ) Recent Results (from the past 240 hours)  Resp panel by RT-PCR (RSV, Flu A&B, Covid) Anterior Nasal Swab     Status: None   Collection Time: 09/24/24  4:52 PM   Specimen: Anterior Nasal Swab  Result Value  Ref Range Status   SARS Coronavirus 2 by RT PCR NEGATIVE NEGATIVE Final    Comment: (NOTE) SARS-CoV-2 target nucleic acids are NOT DETECTED.  The SARS-CoV-2 RNA is generally detectable in upper respiratory specimens during the acute phase of infection. The lowest concentration of SARS-CoV-2 viral copies this assay can detect is 138 copies/mL. A negative result does not preclude SARS-Cov-2 infection and should not be used as the sole basis for treatment or other patient management decisions. A negative result may occur with  improper specimen collection/handling, submission of specimen other than nasopharyngeal swab, presence of viral mutation(s) within the areas targeted by this assay, and inadequate number of viral copies(<138 copies/mL). A negative result must be combined with clinical observations, patient history, and epidemiological information. The expected result is Negative.  Fact Sheet for Patients:  bloggercourse.com  Fact Sheet for Healthcare Providers:  seriousbroker.it  This test is no t yet approved or cleared by the [place]  FDA and  has been authorized for detection and/or diagnosis of SARS-CoV-2 by FDA under an Emergency Use Authorization (EUA). This EUA will remain  in effect (meaning this test can be used) for the duration of the COVID-19 declaration under Section 564(b)(1) of the Act, 21 U.S.C.section 360bbb-3(b)(1), unless the authorization is terminated  or revoked sooner.       Influenza A by PCR NEGATIVE NEGATIVE Final   Influenza B by PCR NEGATIVE NEGATIVE Final    Comment: (NOTE) The Xpert Xpress SARS-CoV-2/FLU/RSV plus assay is intended as an aid in the diagnosis of influenza from Nasopharyngeal swab specimens and should not be used as a sole basis for treatment. Nasal washings and aspirates are unacceptable for Xpert Xpress SARS-CoV-2/FLU/RSV testing.  Fact Sheet for  Patients: bloggercourse.com  Fact Sheet for Healthcare Providers: seriousbroker.it  This test is not yet approved or cleared by the [place]  FDA and has been authorized for detection and/or diagnosis of SARS-CoV-2 by FDA under an Emergency Use Authorization (EUA). This EUA will remain in effect (meaning this test can be used) for the duration of the COVID-19 declaration under Section 564(b)(1) of the Act, 21 U.S.C. section 360bbb-3(b)(1), unless the authorization is terminated or revoked.     Resp Syncytial Virus by PCR NEGATIVE NEGATIVE Final    Comment: (NOTE) Fact Sheet for Patients: bloggercourse.com  Fact Sheet  for Healthcare Providers: seriousbroker.it  This test is not yet approved or cleared by the [place]  FDA and has been authorized for detection and/or diagnosis of SARS-CoV-2 by FDA under an Emergency Use Authorization (EUA). This EUA will remain in effect (meaning this test can be used) for the duration of the COVID-19 declaration under Section 564(b)(1) of the Act, 21 U.S.C. section 360bbb-3(b)(1), unless the authorization is terminated or revoked.  Performed at [facility], [address] [place], [place] [address]      Radiological Exams on Admission:   Assessment/Plan Principal Problem:   CAP (community acquired pneumonia) Active Problems:   Asthma   Hypertension   HLD (hyperlipidemia)   AKI (acute kidney injury)   Hyponatremia   Diarrhea   Alcohol abuse   Nicotine  dependence   Depression   Hypophosphatemia   Assessment and Plan:   CAP (community acquired pneumonia): Chest x-ray showed right basilar infiltration.  Patient has 2 L new oxygen requirement.  Patient does not have leukocytosis, suspecting viral infection.  Patient had negative PCR for COVID, flu and RSV. Will check RVP.  - Will admit to tele bed as inpt - IV  Rocephin  and azithromycin  - Incentive spirometry - Mucinex  for cough  - Bronchodilators - Urine legionella and S. pneumococcal antigen - Follow up blood culture x2, sputum culture - Check respiratory virus panel - IVF: 1L of LR and 500 cc of NS bolus in ED, followed by 75 mL of NS  Asthma: no wheezing on auscultation. -Bronchodilators.  Mucinex   Hypertension -IV hydralazine  as needed - Continue home amlodipine   HLD (hyperlipidemia) -Zocor   AKI (acute kidney injury): Baseline creatinine 0.85 on 12/16/2023.  His creatinine is 1.55, BUN 25, GFR 55.  Likely due to dehydration. -IV fluid as above - Avoid using renal toxic medications.  Hyponatremia: Sodium 124.  Mental status normal.  Likely due to dehydration and diarrhea. - Will check urine sodium, urine osmolality, serum osmolality. - Fluid restriction - IVF: as above - Sodium chloride  tablet 1 g twice daily - f/u by BMP q8h - avoid over correction too fast due to risk of central pontine myelinolysis - check Mg --> 2.2 - check phosphorus level --> 2.4 --> will give 15 millimoles of potassium phosphate  Diarrhea: - Check C. difficile and GI pathogen panel  Alcohol abuse and nicotine  dependence: Currently no signs of alcohol withdrawal - Did counseling about importance of quitting alcohol use and smoking - Nicotine  patch - CIWA protocol  Depression - Continue Celexa  and Remeron        DVT ppx: SQ Lovenox   Code Status: Full code     Family Communication:     not done, no family member is at bed side.       Disposition Plan:  Anticipate discharge back to previous environment  Consults called:  none  Admission status and Level of care: Telemetry:  as inpt        Dispo: The patient is from: Home              Anticipated d/c is to: Home              Anticipated d/c date is: 2 days              Patient currently is not medically stable to d/c.    Severity of Illness:  The appropriate patient status for this  patient is INPATIENT. Inpatient status is judged to be reasonable and necessary in order to provide the  required intensity of service to ensure the patient's safety. The patient's presenting symptoms, physical exam findings, and initial radiographic and laboratory data in the context of their chronic comorbidities is felt to place them at high risk for further clinical deterioration. Furthermore, it is not anticipated that the patient will be medically stable for discharge from the hospital within 2 midnights of admission.   * I certify that at the point of admission it is my clinical judgment that the patient will require inpatient hospital care spanning beyond 2 midnights from the point of admission due to high intensity of service, high risk for further deterioration and high frequency of surveillance required.*       Date of Service 09/24/2024    Caleb Exon [facility]   If 7PM-7AM, please contact night-coverage [email or web address] 09/24/2024, 11:19 PM     [1]  Allergies Allergen Reactions   Bee Venom Swelling   "

## 2024-09-25 DIAGNOSIS — A0811 Acute gastroenteropathy due to Norwalk agent: Secondary | ICD-10-CM

## 2024-09-25 LAB — GASTROINTESTINAL PANEL BY PCR, STOOL (REPLACES STOOL CULTURE)

## 2024-09-25 LAB — CBC
HCT: 36.6 % — ABNORMAL LOW (ref 39.0–52.0)
Hemoglobin: 12.4 g/dL — ABNORMAL LOW (ref 13.0–17.0)
MCH: 26.6 pg (ref 26.0–34.0)
MCHC: 33.9 g/dL (ref 30.0–36.0)
MCV: 78.4 fL — ABNORMAL LOW (ref 80.0–100.0)
Platelets: 230 K/uL (ref 150–400)
RBC: 4.67 MIL/uL (ref 4.22–5.81)
RDW: 13.2 % (ref 11.5–15.5)
WBC: 9.2 K/uL (ref 4.0–10.5)
nRBC: 0 % (ref 0.0–0.2)

## 2024-09-25 LAB — MAGNESIUM: Magnesium: 2.3 mg/dL (ref 1.7–2.4)

## 2024-09-25 LAB — C DIFFICILE QUICK SCREEN W PCR REFLEX
C Diff antigen: NEGATIVE
C Diff interpretation: NOT DETECTED
C Diff toxin: NEGATIVE

## 2024-09-25 LAB — BASIC METABOLIC PANEL WITH GFR
Anion gap: 14 (ref 5–15)
BUN: 20 mg/dL (ref 6–20)
CO2: 22 mmol/L (ref 22–32)
Calcium: 8 mg/dL — ABNORMAL LOW (ref 8.9–10.3)
Chloride: 91 mmol/L — ABNORMAL LOW (ref 98–111)
Creatinine, Ser: 1.26 mg/dL — ABNORMAL HIGH (ref 0.61–1.24)
GFR, Estimated: >60 mL/min
Glucose, Bld: 104 mg/dL — ABNORMAL HIGH (ref 70–99)
Potassium: 3.3 mmol/L — ABNORMAL LOW (ref 3.5–5.1)
Sodium: 128 mmol/L — ABNORMAL LOW (ref 135–145)

## 2024-09-25 LAB — HEPATIC FUNCTION PANEL
ALT: 36 U/L (ref 0–44)
AST: 95 U/L — ABNORMAL HIGH (ref 15–41)
Albumin: 2.9 g/dL — ABNORMAL LOW (ref 3.5–5.0)
Alkaline Phosphatase: 80 U/L (ref 38–126)
Bilirubin, Direct: 0.2 mg/dL (ref 0.0–0.2)
Indirect Bilirubin: 0.2 mg/dL — ABNORMAL LOW (ref 0.3–0.9)
Total Bilirubin: 0.4 mg/dL (ref 0.0–1.2)
Total Protein: 6.4 g/dL — ABNORMAL LOW (ref 6.5–8.1)

## 2024-09-25 LAB — OSMOLALITY: Osmolality: 270 mosm/kg — ABNORMAL LOW (ref 275–295)

## 2024-09-25 LAB — HIV ANTIBODY (ROUTINE TESTING W REFLEX): HIV Screen 4th Generation wRfx: NONREACTIVE

## 2024-09-25 MED ORDER — POTASSIUM CHLORIDE CRYS ER 20 MEQ PO TBCR: 60.0000 meq | EXTENDED_RELEASE_TABLET | Freq: Once | ORAL | Status: DC

## 2024-09-25 MED ORDER — IPRATROPIUM-ALBUTEROL 0.5-2.5 (3) MG/3ML IN SOLN
3.0000 mL | Freq: Two times a day (BID) | RESPIRATORY_TRACT | Status: DC
  Administered 2024-09-25 – 2024-09-27 (×4): 3 mL via RESPIRATORY_TRACT
  Filled 2024-09-25 (×4): qty 3

## 2024-09-25 MED ORDER — AZITHROMYCIN 250 MG PO TABS
500.0000 mg | ORAL_TABLET | Freq: Every day | ORAL | Status: AC
  Administered 2024-09-25 – 2024-09-28 (×4): 500 mg via ORAL
  Filled 2024-09-25 (×3): qty 2

## 2024-09-25 MED ORDER — IPRATROPIUM-ALBUTEROL 0.5-2.5 (3) MG/3ML IN SOLN
3.0000 mL | Freq: Three times a day (TID) | RESPIRATORY_TRACT | Status: DC
  Administered 2024-09-25: 3 mL via RESPIRATORY_TRACT
  Filled 2024-09-25: qty 3

## 2024-09-25 NOTE — Progress Notes (Signed)
 " PROGRESS NOTE    Travis Leach  FMW:[id number] DOB: [birth year] DOA: 09/24/2024 PCP: Iloabachie, Chioma E, NP     Brief Narrative:   Travis Leach is a 48 y.o. male with medical history significant of polysubstance abuse (cocaine, marijuana, tobacco and alcohol), HTN,  HLD, asthma, depression, delirium treatment, pancreatitis, hyponatremia, decreased libido, who presents with SOB and diarrhea.   Patient states he has been sick for more than 5 days.  Symptoms include productive cough with yellow-colored sputum production, mild subjective fever, chills, SOB which has been progressively worsening.  No chest pain.  He also reports generalized weakness and fatigue.  No sore throat.  He has nausea and diarrhea, no vomiting or abdominal pain.  No symptoms of UTI.  Patient states that he has many episodes of watery diarrhea each day in the past 5 days.   Assessment & Plan:   Principal Problem:   Norovirus Active Problems:   CAP (community acquired pneumonia)   Asthma   Hypertension   HLD (hyperlipidemia)   AKI (acute kidney injury)   Hyponatremia   Diarrhea   Alcohol abuse   Nicotine  dependence   Depression   Hypophosphatemia   Cocaine abuse (HCC)  # CAP RLL. Improving. Aspiration risk given hx etoh though he reports only intermittent drinking as of late. As he is improving will continue current therapeutics. Covid/flu/rsv neg. 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 124 on arrival, 128 today. Low urine sodium consistent with hypovolemia - continue fluids and trend  # Acute hypoxic respiratory failure O2 88% in ER and dyspneic, resolved w/ 2 liters - wean Turner O2 as able  #  Hypokalemia 2/2 diarrhea - replete - f/u Mg  # AKI Prerenal from dehydration. 1.55 on arrival, improved to 1.26 today - continue fluids  # Cocaine abuse Reports intermittent smoked crack cocaine - f/u hiv, hcv, rpr  # Alcohol abuse History of DTs. Reports has cut down significantly last couple of years, now only intermittent use, last drink 2 weeks ago. No s/s withdrawal - monitor - f/u LFTs - vitamins  # HTN Normotensive - home amlodipine  on hold  # MDD - home citalopram , mirtazapine    DVT prophylaxis: lovenox  Code Status: full Family Communication: none at bedside  Level of care: Telemetry Status is: Inpatient Remains inpatient appropriate because: severity of illness    Consultants:  none  Procedures: none  Antimicrobials:  See above    Subjective: Reports symptoms all improving - more energy, less cough  Objective: Vitals:   09/24/24 2321 09/25/24 0440 09/25/24 0546 09/25/24 0823  BP:  124/75  (!) 118/58  Pulse:  95  72  Resp:  19  20  Temp:  (!) 103.2 F (39.6 C) (!) 101.2 F (38.4 C) 99.2 F (37.3 C)  TempSrc:  Oral Oral Oral  SpO2: 94% 96%  96%  Weight:      Height:        Intake/Output Summary (Last 24 hours) at 09/25/2024 0932 Last data filed at 09/25/2024 0529 Gross per 24 hour  Intake 1227.21 ml  Output 970 ml  Net 257.21 ml   Filed Weights   09/24/24 1610  Weight: 83.9 kg    Examination:  General exam:  Appears calm and comfortable  Respiratory system: rales right base otherwise clear Cardiovascular system: S1 & S2 heard, RRR.   Gastrointestinal system: Abdomen is mildly distended, soft and nontender. No organomegaly or masses felt.   Central nervous system: Alert and oriented. No focal neurological deficits. Extremities: Symmetric 5 x 5 power. Skin: No rashes, lesions or ulcers Psychiatry: Judgement and insight appear normal. Mood & affect appropriate.     Data Reviewed: I have personally reviewed following labs  and imaging studies  CBC: Recent Labs  Lab 09/24/24 1652 09/25/24 0500  WBC 9.0 9.2  HGB 13.4 12.4*  HCT 40.3 36.6*  MCV 77.9* 78.4*  PLT 220 230   Basic Metabolic Panel: Recent Labs  Lab 09/24/24 1652 09/24/24 2023 09/25/24 0500  NA 124* 127* 128*  K 4.1 3.7 3.3*  CL 88* 90* 91*  CO2 22 23 22   GLUCOSE 126* 111* 104*  BUN 25* 24* 20  CREATININE 1.55* 1.45* 1.26*  CALCIUM 8.9 8.3* 8.0*  MG  --  2.2  --   PHOS  --  2.4*  --    GFR: Estimated Creatinine Clearance: 74.8 mL/min (A) (by C-G formula based on SCr of 1.26 mg/dL (H)). Liver Function Tests: No results for input(s): AST, ALT, ALKPHOS, BILITOT, PROT, ALBUMIN in the last 168 hours. No results for input(s): LIPASE, AMYLASE in the last 168 hours. No results for input(s): AMMONIA in the last 168 hours. Coagulation Profile: No results for input(s): INR, PROTIME in the last 168 hours. Cardiac Enzymes: No results for input(s): CKTOTAL, CKMB, CKMBINDEX, TROPONINI in the last 168 hours. BNP (last 3 results) No results for input(s): PROBNP in the last 8760 hours. HbA1C: No results for input(s): HGBA1C in the last 72 hours. CBG: No results for input(s): GLUCAP in the last 168 hours. Lipid Profile: No results for input(s): CHOL, HDL, LDLCALC, TRIG, CHOLHDL, LDLDIRECT in the last 72 hours. Thyroid Function Tests: No results for input(s): TSH, T4TOTAL, FREET4, T3FREE, THYROIDAB in the last 72 hours. Anemia Panel: No results for input(s): VITAMINB12, FOLATE, FERRITIN, TIBC, IRON, RETICCTPCT in the last 72 hours. Urine analysis:    Component Value Date/Time   COLORURINE YELLOW 05/24/2023 1258   APPEARANCEUR CLEAR 05/24/2023 1258   APPEARANCEUR Cloudy (A) 01/03/2022 1329   LABSPEC 1.032 (H) 05/24/2023 1258   PHURINE 5.0 05/24/2023 1258   GLUCOSEU NEGATIVE 05/24/2023 1258   HGBUR NEGATIVE 05/24/2023 1258   BILIRUBINUR NEGATIVE 05/24/2023 1258    BILIRUBINUR Negative 01/03/2022 1329   KETONESUR 5 (A) 05/24/2023 1258   PROTEINUR 100 (A) 05/24/2023 1258   NITRITE NEGATIVE 05/24/2023 1258   LEUKOCYTESUR NEGATIVE 05/24/2023 1258   Sepsis Labs: @LABRCNTIP (procalcitonin:4,lacticidven:4)  ) Recent Results (from the past 240 hours)  Resp panel by RT-PCR (RSV, Flu A&B, Covid) Anterior Nasal Swab     Status: None   Collection Time: 09/24/24  4:52 PM   Specimen: Anterior Nasal Swab  Result Value Ref Range Status   SARS Coronavirus 2 by RT PCR NEGATIVE NEGATIVE Final    Comment: (NOTE) SARS-CoV-2 target nucleic acids are NOT DETECTED.  The SARS-CoV-2 RNA is generally detectable in upper respiratory specimens during the acute phase of infection. The lowest concentration of SARS-CoV-2 viral copies this assay can detect is 138 copies/mL. A negative result does not preclude SARS-Cov-2 infection and should not be used as the sole basis for treatment or other patient management decisions. A negative result may occur with  improper specimen collection/handling, submission of specimen other than nasopharyngeal swab, presence  of viral mutation(s) within the areas targeted by this assay, and inadequate number of viral copies(<138 copies/mL). A negative result must be combined with clinical observations, patient history, and epidemiological information. The expected result is Negative.  Fact Sheet for Patients:  bloggercourse.com  Fact Sheet for Healthcare Providers:  seriousbroker.it  This test is no t yet approved or cleared by the [place]  FDA and  has been authorized for detection and/or diagnosis of SARS-CoV-2 by FDA under an Emergency Use Authorization (EUA). This EUA will remain  in effect (meaning this test can be used) for the duration of the COVID-19 declaration under Section 564(b)(1) of the Act, 21 U.S.C.section 360bbb-3(b)(1), unless the authorization is terminated  or  revoked sooner.       Influenza A by PCR NEGATIVE NEGATIVE Final   Influenza B by PCR NEGATIVE NEGATIVE Final    Comment: (NOTE) The Xpert Xpress SARS-CoV-2/FLU/RSV plus assay is intended as an aid in the diagnosis of influenza from Nasopharyngeal swab specimens and should not be used as a sole basis for treatment. Nasal washings and aspirates are unacceptable for Xpert Xpress SARS-CoV-2/FLU/RSV testing.  Fact Sheet for Patients: bloggercourse.com  Fact Sheet for Healthcare Providers: seriousbroker.it  This test is not yet approved or cleared by the [place]  FDA and has been authorized for detection and/or diagnosis of SARS-CoV-2 by FDA under an Emergency Use Authorization (EUA). This EUA will remain in effect (meaning this test can be used) for the duration of the COVID-19 declaration under Section 564(b)(1) of the Act, 21 U.S.C. section 360bbb-3(b)(1), unless the authorization is terminated or revoked.     Resp Syncytial Virus by PCR NEGATIVE NEGATIVE Final    Comment: (NOTE) Fact Sheet for Patients: bloggercourse.com  Fact Sheet for Healthcare Providers: seriousbroker.it  This test is not yet approved or cleared by the [place]  FDA and has been authorized for detection and/or diagnosis of SARS-CoV-2 by FDA under an Emergency Use Authorization (EUA). This EUA will remain in effect (meaning this test can be used) for the duration of the COVID-19 declaration under Section 564(b)(1) of the Act, 21 U.S.C. section 360bbb-3(b)(1), unless the authorization is terminated or revoked.  Performed at [facility], [address] [place], [place] [address]   Blood culture (routine x 2)     Status: None (Preliminary result)   Collection Time: 09/24/24  4:52 PM   Specimen: BLOOD  Result Value Ref Range Status   Specimen Description BLOOD BLOOD RIGHT HAND  Final    Special Requests   Final    BOTTLES DRAWN AEROBIC AND ANAEROBIC Blood Culture results may not be optimal due to an inadequate volume of blood received in culture bottles   Culture   Final    NO GROWTH < 24 HOURS Performed at [facility], [address], [place], [place] [address]    Report Status PENDING  Incomplete  Blood culture (routine x 2)     Status: None (Preliminary result)   Collection Time: 09/24/24  4:52 PM   Specimen: BLOOD  Result Value Ref Range Status   Specimen Description BLOOD BLOOD RIGHT FOREARM  Final   Special Requests   Final    BOTTLES DRAWN AEROBIC AND ANAEROBIC Blood Culture results may not be optimal due to an inadequate volume of blood received in culture bottles   Culture   Final    NO GROWTH < 24 HOURS Performed at [facility], [address], [place], [place] [address]    Report  Status PENDING  Incomplete  C Difficile Quick Screen w PCR reflex     Status: None   Collection Time: 09/25/24  2:04 AM   Specimen: STOOL  Result Value Ref Range Status   C Diff antigen NEGATIVE NEGATIVE Final   C Diff toxin NEGATIVE NEGATIVE Final   C Diff interpretation No C. difficile detected.  Final    Comment: Performed at [facility], [address] [place], [place] [address]  Gastrointestinal Panel by PCR , Stool     Status: Abnormal   Collection Time: 09/25/24  2:04 AM   Specimen: STOOL  Result Value Ref Range Status   Campylobacter species NOT DETECTED NOT DETECTED Final   Plesimonas shigelloides NOT DETECTED NOT DETECTED Final   Salmonella species NOT DETECTED NOT DETECTED Final   Yersinia enterocolitica NOT DETECTED NOT DETECTED Final   Vibrio species NOT DETECTED NOT DETECTED Final   Vibrio cholerae NOT DETECTED NOT DETECTED Final   Enteroaggregative E coli (EAEC) NOT DETECTED NOT DETECTED Final   Enteropathogenic E coli (EPEC) NOT DETECTED NOT DETECTED Final   Enterotoxigenic E coli (ETEC) NOT DETECTED NOT DETECTED Final    Shiga like toxin producing E coli (STEC) NOT DETECTED NOT DETECTED Final   Shigella/Enteroinvasive E coli (EIEC) NOT DETECTED NOT DETECTED Final   Cryptosporidium NOT DETECTED NOT DETECTED Final   Cyclospora cayetanensis NOT DETECTED NOT DETECTED Final   Entamoeba histolytica NOT DETECTED NOT DETECTED Final   Giardia lamblia NOT DETECTED NOT DETECTED Final   Adenovirus F40/41 NOT DETECTED NOT DETECTED Final   Astrovirus NOT DETECTED NOT DETECTED Final   Norovirus GI/GII DETECTED (A) NOT DETECTED Final    Comment: RESULT CALLED TO, READ BACK BY AND VERIFIED WITH: CLEOPATRA RUFANDIRORI, RN @0438  09/25/2024 COP    Rotavirus A NOT DETECTED NOT DETECTED Final   Sapovirus (I, II, IV, and V) NOT DETECTED NOT DETECTED Final    Comment: Performed at [facility], [address] [place], [place] [address]         Radiology Studies: DG Chest Port 1 View Result Date: 09/24/2024 EXAM: 1 VIEW(S) XRAY OF THE CHEST 09/24/2024 04:22:00 PM COMPARISON: None available. CLINICAL HISTORY: SOB (shortness of breath) FINDINGS: LUNGS AND PLEURA: Hazy opacity at right lung base involving right middle and/or lower lobes. Possible small right pleural effusion. No pneumothorax. HEART AND MEDIASTINUM: No acute abnormality of the cardiac and mediastinal silhouettes. BONES AND SOFT TISSUES: No acute osseous abnormality. IMPRESSION: 1. Hazy opacity at the right lung base, worrisome for pneumonia. 2. Possible small right pleural effusion. Electronically signed by: Greig Pique MD 09/24/2024 05:24 PM EST RP Workstation: [id number]        Scheduled Meds:  amLODipine   10 mg Oral Daily   citalopram   10 mg Oral Daily   enoxaparin  (LOVENOX ) injection  40 mg Subcutaneous Q24H   folic acid   1 mg Oral Daily   ipratropium-albuterol   3 mL Nebulization BID   LORazepam   0-4 mg Intravenous Q6H   Followed by   NOREEN ON 09/26/2024] LORazepam   0-4 mg Intravenous Q12H   mirtazapine   15 mg Oral QHS   multivitamin  with minerals  1 tablet Oral Daily   nicotine   21 mg Transdermal Daily   potassium chloride   60 mEq Oral Once   simvastatin   20 mg Oral q1800   sodium chloride   1 g Oral BID WC   thiamine   100 mg Oral Daily   Or   thiamine   100 mg Intravenous Daily  Continuous Infusions:  sodium chloride  Stopped (09/25/24 0022)   azithromycin  (ZITHROMAX ) 500 mg in sodium chloride  0.9 % 250 mL IVPB     cefTRIAXone  (ROCEPHIN )  IV       LOS: 1 day     Devaughn KATHEE Ban, MD [facility]   If 7PM-7AM, please contact night-coverage [email or web address] Password [email or web address] 09/25/2024, 9:32 AM     "

## 2024-09-25 NOTE — Progress Notes (Signed)
 Patient tested positive for norovirus. MD contacted and made aware. Patient continues on enteric precautions.

## 2024-09-25 NOTE — Plan of Care (Signed)
" °  Problem: Education: Goal: Knowledge of General Education information will improve Description: Including pain rating scale, medication(s)/side effects and non-pharmacologic comfort measures Outcome: Progressing   Problem: Activity: Goal: Risk for activity intolerance will decrease Outcome: Progressing   Problem: Coping: Goal: Level of anxiety will decrease Outcome: Progressing   Problem: Elimination: Goal: Will not experience complications related to bowel motility Outcome: Progressing Goal: Will not experience complications related to urinary retention Outcome: Progressing   Problem: Pain Managment: Goal: General experience of comfort will improve and/or be controlled Outcome: Progressing   Problem: Safety: Goal: Ability to remain free from injury will improve Outcome: Progressing   Problem: Skin Integrity: Goal: Risk for impaired skin integrity will decrease Outcome: Progressing   Problem: Clinical Measurements: Goal: Ability to maintain a body temperature in the normal range will improve Outcome: Progressing   Problem: Respiratory: Goal: Ability to maintain a clear airway will improve Outcome: Progressing   "

## 2024-09-26 LAB — BASIC METABOLIC PANEL WITH GFR
Anion gap: 13 (ref 5–15)
BUN: 13 mg/dL (ref 6–20)
CO2: 22 mmol/L (ref 22–32)
Calcium: 8 mg/dL — ABNORMAL LOW (ref 8.9–10.3)
Chloride: 96 mmol/L — ABNORMAL LOW (ref 98–111)
Creatinine, Ser: 1.08 mg/dL (ref 0.61–1.24)
GFR, Estimated: >60 mL/min
Glucose, Bld: 93 mg/dL (ref 70–99)
Potassium: 3.8 mmol/L (ref 3.5–5.1)
Sodium: 132 mmol/L — ABNORMAL LOW (ref 135–145)

## 2024-09-26 LAB — SYPHILIS: RPR W/REFLEX TO RPR TITER AND TREPONEMAL ANTIBODIES, TRADITIONAL SCREENING AND DIAGNOSIS ALGORITHM: RPR Ser Ql: NONREACTIVE

## 2024-09-26 MED ORDER — SODIUM CHLORIDE 0.9 % IV BOLUS
500.0000 mL | Freq: Once | INTRAVENOUS | Status: AC
  Administered 2024-09-26: 500 mL via INTRAVENOUS

## 2024-09-26 NOTE — Progress Notes (Signed)
 " PROGRESS NOTE    Travis Leach  FMW:[id number] DOB: July 27, 1976 DOA: 09/24/2024 PCP: Iloabachie, Chioma E, NP    Assessment & Plan:   Principal Problem:   Norovirus Active Problems:   CAP (community acquired pneumonia)   Asthma   Hypertension   HLD (hyperlipidemia)   AKI (acute kidney injury)   Hyponatremia   Diarrhea   Alcohol abuse   Nicotine  dependence   Depression   Hypophosphatemia   Cocaine abuse (HCC)  Assessment and Plan: CAP: of RLL. Continue on IV rocephin , azithromycin , bronchodilators & encourage incentive. COVID/influenza, RSV all neg. Resp viral panel ordered but not collected yet, messaged pt's nurse.   Norovirus infection: w/ fever, diarrhea. Continue w/ supportive care    Hyponatremia: trending up. Will continue to monitor    Acute hypoxic respiratory failure: O2 88% in ER and dyspneic. Continue on supplemental oxygen and wean as tolerated   Hypokalemia: WNL today    AKI: Cr is trending down from day prior. Avoid nephrotoxic meds    Cocaine abuse: reports intermittent use of crack cocaine  Alcohol abuse: w/ hx of DTs. No signs or symptoms of w/drawal. Will continue to monitor    HTN: WNL currently. Holding home amlodipine     Major depression: severity unknown. Continue on home dose of citalopram , mirtazapine        DVT prophylaxis: lovenox   Code Status: full  Family Communication:  Disposition Plan: likely d/c back home  Level of care: Telemetry  Status is: Inpatient Remains inpatient appropriate because: severity of illness    Consultants:    Procedures:   Antimicrobials:    Subjective: Pt c/o diarrhea  Objective: Vitals:   09/26/24 0430 09/26/24 0546 09/26/24 0641 09/26/24 0846  BP: 131/75 121/78 120/84   Pulse: (!) 117 100 90   Resp: 17 (!) 21 20   Temp: (!) 102.1 F (38.9 C) (!) 100.7 F (38.2 C) 98.8 F (37.1 C)   TempSrc: Oral Oral Oral   SpO2: 94% 96% 95% 93%  Weight:      Height:        Intake/Output  Summary (Last 24 hours) at 09/26/2024 1004 Last data filed at 09/26/2024 0445 Gross per 24 hour  Intake 240 ml  Output 500 ml  Net -260 ml   Filed Weights   09/24/24 1610  Weight: 83.9 kg    Examination:  General exam: Appears calm and comfortable  Respiratory system: decreased breath sounds b/l  Cardiovascular system: S1 & S2+. No rubs, gallops or clicks.  Gastrointestinal system: Abdomen is nondistended, soft and nontender. Normal bowel sounds heard. Central nervous system: Alert and oriented. Moves all extremities Psychiatry: Judgement and insight appear normal. Flat mood and affect     Data Reviewed: I have personally reviewed following labs and imaging studies  CBC: Recent Labs  Lab 09/24/24 1652 09/25/24 0500  WBC 9.0 9.2  HGB 13.4 12.4*  HCT 40.3 36.6*  MCV 77.9* 78.4*  PLT 220 230   Basic Metabolic Panel: Recent Labs  Lab 09/24/24 1652 09/24/24 2023 09/25/24 0500 09/25/24 0959 09/26/24 0423  NA 124* 127* 128*  --  132*  K 4.1 3.7 3.3*  --  3.8  CL 88* 90* 91*  --  96*  CO2 22 23 22   --  22  GLUCOSE 126* 111* 104*  --  93  BUN 25* 24* 20  --  13  CREATININE 1.55* 1.45* 1.26*  --  1.08  CALCIUM 8.9 8.3* 8.0*  --  8.0*  MG  --  2.2  --  2.3  --   PHOS  --  2.4*  --   --   --    GFR: Estimated Creatinine Clearance: 87.3 mL/min (by C-G formula based on SCr of 1.08 mg/dL). Liver Function Tests: Recent Labs  Lab 09/25/24 0959  AST 95*  ALT 36  ALKPHOS 80  BILITOT 0.4  PROT 6.4*  ALBUMIN 2.9*   No results for input(s): LIPASE, AMYLASE in the last 168 hours. No results for input(s): AMMONIA in the last 168 hours. Coagulation Profile: No results for input(s): INR, PROTIME in the last 168 hours. Cardiac Enzymes: No results for input(s): CKTOTAL, CKMB, CKMBINDEX, TROPONINI in the last 168 hours. BNP (last 3 results) No results for input(s): PROBNP in the last 8760 hours. HbA1C: No results for input(s): HGBA1C in the last  72 hours. CBG: No results for input(s): GLUCAP in the last 168 hours. Lipid Profile: No results for input(s): CHOL, HDL, LDLCALC, TRIG, CHOLHDL, LDLDIRECT in the last 72 hours. Thyroid Function Tests: No results for input(s): TSH, T4TOTAL, FREET4, T3FREE, THYROIDAB in the last 72 hours. Anemia Panel: No results for input(s): VITAMINB12, FOLATE, FERRITIN, TIBC, IRON, RETICCTPCT in the last 72 hours. Sepsis Labs: Recent Labs  Lab 09/24/24 1652  LATICACIDVEN 1.6    Recent Results (from the past 240 hours)  Resp panel by RT-PCR (RSV, Flu A&B, Covid) Anterior Nasal Swab     Status: None   Collection Time: 09/24/24  4:52 PM   Specimen: Anterior Nasal Swab  Result Value Ref Range Status   SARS Coronavirus 2 by RT PCR NEGATIVE NEGATIVE Final    Comment: (NOTE) SARS-CoV-2 target nucleic acids are NOT DETECTED.  The SARS-CoV-2 RNA is generally detectable in upper respiratory specimens during the acute phase of infection. The lowest concentration of SARS-CoV-2 viral copies this assay can detect is 138 copies/mL. A negative result does not preclude SARS-Cov-2 infection and should not be used as the sole basis for treatment or other patient management decisions. A negative result may occur with  improper specimen collection/handling, submission of specimen other than nasopharyngeal swab, presence of viral mutation(s) within the areas targeted by this assay, and inadequate number of viral copies(<138 copies/mL). A negative result must be combined with clinical observations, patient history, and epidemiological information. The expected result is Negative.  Fact Sheet for Patients:  bloggercourse.com  Fact Sheet for Healthcare Providers:  seriousbroker.it  This test is no t yet approved or cleared by the [place]  FDA and  has been authorized for detection and/or diagnosis of SARS-CoV-2 by FDA  under an Emergency Use Authorization (EUA). This EUA will remain  in effect (meaning this test can be used) for the duration of the COVID-19 declaration under Section 564(b)(1) of the Act, 21 U.S.C.section 360bbb-3(b)(1), unless the authorization is terminated  or revoked sooner.       Influenza A by PCR NEGATIVE NEGATIVE Final   Influenza B by PCR NEGATIVE NEGATIVE Final    Comment: (NOTE) The Xpert Xpress SARS-CoV-2/FLU/RSV plus assay is intended as an aid in the diagnosis of influenza from Nasopharyngeal swab specimens and should not be used as a sole basis for treatment. Nasal washings and aspirates are unacceptable for Xpert Xpress SARS-CoV-2/FLU/RSV testing.  Fact Sheet for Patients: bloggercourse.com  Fact Sheet for Healthcare Providers: seriousbroker.it  This test is not yet approved or cleared by the [place]  FDA and has been authorized for detection and/or diagnosis of SARS-CoV-2 by FDA under an Emergency  Use Authorization (EUA). This EUA will remain in effect (meaning this test can be used) for the duration of the COVID-19 declaration under Section 564(b)(1) of the Act, 21 U.S.C. section 360bbb-3(b)(1), unless the authorization is terminated or revoked.     Resp Syncytial Virus by PCR NEGATIVE NEGATIVE Final    Comment: (NOTE) Fact Sheet for Patients: bloggercourse.com  Fact Sheet for Healthcare Providers: seriousbroker.it  This test is not yet approved or cleared by the [place]  FDA and has been authorized for detection and/or diagnosis of SARS-CoV-2 by FDA under an Emergency Use Authorization (EUA). This EUA will remain in effect (meaning this test can be used) for the duration of the COVID-19 declaration under Section 564(b)(1) of the Act, 21 U.S.C. section 360bbb-3(b)(1), unless the authorization is terminated or revoked.  Performed at [facility], [address] [place], [place] [address]   Blood culture (routine x 2)     Status: None (Preliminary result)   Collection Time: 09/24/24  4:52 PM   Specimen: BLOOD  Result Value Ref Range Status   Specimen Description BLOOD BLOOD RIGHT HAND  Final   Special Requests   Final    BOTTLES DRAWN AEROBIC AND ANAEROBIC Blood Culture results may not be optimal due to an inadequate volume of blood received in culture bottles   Culture   Final    NO GROWTH 2 DAYS Performed at [facility], [address], [place], [place] [address]    Report Status PENDING  Incomplete  Blood culture (routine x 2)     Status: None (Preliminary result)   Collection Time: 09/24/24  4:52 PM   Specimen: BLOOD  Result Value Ref Range Status   Specimen Description BLOOD BLOOD RIGHT FOREARM  Final   Special Requests   Final    BOTTLES DRAWN AEROBIC AND ANAEROBIC Blood Culture results may not be optimal due to an inadequate volume of blood received in culture bottles   Culture   Final    NO GROWTH 2 DAYS Performed at [facility], [address] [place], [place] [address]    Report Status PENDING  Incomplete  C Difficile Quick Screen w PCR reflex     Status: None   Collection Time: 09/25/24  2:04 AM   Specimen: STOOL  Result Value Ref Range Status   C Diff antigen NEGATIVE NEGATIVE Final   C Diff toxin NEGATIVE NEGATIVE Final   C Diff interpretation No C. difficile detected.  Final    Comment: Performed at [facility], [address] [place], [place] [address]  Gastrointestinal Panel by PCR , Stool     Status: Abnormal   Collection Time: 09/25/24  2:04 AM   Specimen: STOOL  Result Value Ref Range Status   Campylobacter species NOT DETECTED NOT DETECTED Final   Plesimonas shigelloides NOT DETECTED NOT DETECTED Final   Salmonella species NOT DETECTED NOT DETECTED Final   Yersinia enterocolitica NOT DETECTED NOT DETECTED Final   Vibrio species NOT DETECTED NOT  DETECTED Final   Vibrio cholerae NOT DETECTED NOT DETECTED Final   Enteroaggregative E coli (EAEC) NOT DETECTED NOT DETECTED Final   Enteropathogenic E coli (EPEC) NOT DETECTED NOT DETECTED Final   Enterotoxigenic E coli (ETEC) NOT DETECTED NOT DETECTED Final   Shiga like toxin producing E coli (STEC) NOT DETECTED NOT DETECTED Final   Shigella/Enteroinvasive E coli (EIEC) NOT DETECTED NOT DETECTED Final   Cryptosporidium NOT DETECTED NOT DETECTED Final   Cyclospora cayetanensis NOT DETECTED  NOT DETECTED Final   Entamoeba histolytica NOT DETECTED NOT DETECTED Final   Giardia lamblia NOT DETECTED NOT DETECTED Final   Adenovirus F40/41 NOT DETECTED NOT DETECTED Final   Astrovirus NOT DETECTED NOT DETECTED Final   Norovirus GI/GII DETECTED (A) NOT DETECTED Final    Comment: RESULT CALLED TO, READ BACK BY AND VERIFIED WITH: CLEOPATRA RUFANDIRORI, RN @0438  09/25/2024 COP    Rotavirus A NOT DETECTED NOT DETECTED Final   Sapovirus (I, II, IV, and V) NOT DETECTED NOT DETECTED Final    Comment: Performed at [facility], [address], [place], [place] [address]         Radiology Studies: DG Chest Port 1 View Result Date: 09/24/2024 EXAM: 1 VIEW(S) XRAY OF THE CHEST 09/24/2024 04:22:00 PM COMPARISON: None available. CLINICAL HISTORY: SOB (shortness of breath) FINDINGS: LUNGS AND PLEURA: Hazy opacity at right lung base involving right middle and/or lower lobes. Possible small right pleural effusion. No pneumothorax. HEART AND MEDIASTINUM: No acute abnormality of the cardiac and mediastinal silhouettes. BONES AND SOFT TISSUES: No acute osseous abnormality. IMPRESSION: 1. Hazy opacity at the right lung base, worrisome for pneumonia. 2. Possible small right pleural effusion. Electronically signed by: Greig Pique MD 09/24/2024 05:24 PM EST RP Workstation: [id number]        Scheduled Meds:  azithromycin   500 mg Oral Daily   citalopram   10 mg Oral Daily   enoxaparin  (LOVENOX )  injection  40 mg Subcutaneous Q24H   folic acid   1 mg Oral Daily   ipratropium-albuterol   3 mL Nebulization BID   mirtazapine   15 mg Oral QHS   multivitamin with minerals  1 tablet Oral Daily   nicotine   21 mg Transdermal Daily   potassium chloride   60 mEq Oral Once   sodium chloride   1 g Oral BID WC   thiamine   100 mg Oral Daily   Or   thiamine   100 mg Intravenous Daily   Continuous Infusions:  sodium chloride  100 mL/hr at 09/26/24 0547   cefTRIAXone  (ROCEPHIN )  IV 1 g (09/25/24 1736)     LOS: 2 days       Anthony CHRISTELLA Pouch, MD [facility] Pager [phone]  If 7PM-7AM, please contact night-coverage [email or web address] 09/26/2024, 10:04 AM   "

## 2024-09-27 LAB — COMPREHENSIVE METABOLIC PANEL WITH GFR
ALT: 91 U/L — ABNORMAL HIGH (ref 0–44)
AST: 218 U/L — ABNORMAL HIGH (ref 15–41)
Albumin: 2.7 g/dL — ABNORMAL LOW (ref 3.5–5.0)
Alkaline Phosphatase: 94 U/L (ref 38–126)
Anion gap: 13 (ref 5–15)
BUN: 10 mg/dL (ref 6–20)
CO2: 21 mmol/L — ABNORMAL LOW (ref 22–32)
Calcium: 8.2 mg/dL — ABNORMAL LOW (ref 8.9–10.3)
Chloride: 101 mmol/L (ref 98–111)
Creatinine, Ser: 0.92 mg/dL (ref 0.61–1.24)
GFR, Estimated: >60 mL/min
Glucose, Bld: 90 mg/dL (ref 70–99)
Potassium: 3.4 mmol/L — ABNORMAL LOW (ref 3.5–5.1)
Sodium: 135 mmol/L (ref 135–145)
Total Bilirubin: 0.4 mg/dL (ref 0.0–1.2)
Total Protein: 6.2 g/dL — ABNORMAL LOW (ref 6.5–8.1)

## 2024-09-27 MED ORDER — POTASSIUM CHLORIDE CRYS ER 20 MEQ PO TBCR
20.0000 meq | EXTENDED_RELEASE_TABLET | Freq: Once | ORAL | Status: AC
  Administered 2024-09-27: 20 meq via ORAL
  Filled 2024-09-27: qty 1

## 2024-09-27 NOTE — TOC CM/SW Note (Signed)
 Transition of Care [facility]) - Inpatient Brief Assessment   Patient Details  Name: Travis Leach MRN: [id number] Date of Birth: 10-May-1976  [facility]) CM/SW Contact:    Corean ONEIDA Haddock, RN Phone Number: 09/27/2024, 12:14 PM   Clinical Narrative: Transition of Care Department [facility]) has reviewed patient and no TOC needs have been identified at this time.  If new patient transition needs arise, please place a TOC consult.  PCP listed as Iloabachie at [facility] .  [facility]  information added to AVS   Transition of Care Asessment: Insurance and Status: Selfpay Patient has primary care physician: Yes     Prior/Current Home Services: No current home services Social Drivers of Health Review: SDOH reviewed no interventions necessary Readmission risk has been reviewed: Yes Transition of care needs: no transition of care needs at this time

## 2024-09-27 NOTE — Discharge Instructions (Signed)

## 2024-09-27 NOTE — Progress Notes (Addendum)
 " PROGRESS NOTE    Travis Leach  FMW:[id number] DOB: [birth year] DOA: 09/24/2024 PCP: Iloabachie, Chioma E, NP    Assessment & Plan:   Principal Problem:   Norovirus Active Problems:   CAP (community acquired pneumonia)   Asthma   Hypertension   HLD (hyperlipidemia)   AKI (acute kidney injury)   Hyponatremia   Diarrhea   Alcohol abuse   Nicotine  dependence   Depression   Hypophosphatemia   Cocaine abuse (HCC)  Assessment and Plan: CAP: of RLL. Continue on IV ceftriaxone , azithromycin , bronchodilators & encourage incentive spirometry. COVID/influenza, RSV all neg. Resp viral panel ordered but not collected yet, messaged pt's nurse again today   Norovirus infection: w/ fever, diarrhea. Still spiking fevers. Continue w/ supportive care    Hyponatremia: WNL today    Acute hypoxic respiratory failure: O2 88% in ER and dyspneic. Weaned off of supplemental oxygen    Hypokalemia: potassium given    AKI: resolved    Cocaine abuse: reports intermittent use of crack cocaine  Alcohol abuse: w/ hx of DTs. No signs or symptoms of w/drawal. Will continue to monitor    HTN: WNL currently. Holding home dose of amlodipine     Major depression: severity unknown. Continue on home dose of citalopram , mirtazapine         DVT prophylaxis: lovenox   Code Status: full  Family Communication:  Disposition Plan: likely d/c back home  Level of care: Telemetry  Status is: Inpatient Remains inpatient appropriate because: severity of illness, still spiking fevers    Consultants:    Procedures:   Antimicrobials: azithromycin , rocephin    Subjective: Pt c/o diarrhea and cough   Objective: Vitals:   09/27/24 0409 09/27/24 0412 09/27/24 0735 09/27/24 0746  BP: (!) 124/92   133/76  Pulse: (!) 103 (!) 102  (!) 102  Resp: 16   18  Temp: 100.1 F (37.8 C)   98.9 F (37.2 C)  TempSrc: Oral   Oral  SpO2: (!) 89% 92% 91% 97%  Weight:      Height:        Intake/Output Summary  (Last 24 hours) at 09/27/2024 0831 Last data filed at 09/27/2024 0525 Gross per 24 hour  Intake 2007.86 ml  Output --  Net 2007.86 ml   Filed Weights   09/24/24 1610  Weight: 83.9 kg    Examination:  General exam: Appears comfortable  Respiratory system: decreased breath sounds b/l  Cardiovascular system: S1/S2+. No rubs or clicks  Gastrointestinal system: abd is soft, NT, ND, hyperactive bowel sounds Central nervous system: alert & oriented. Moves all extremities  Psychiatry: Judgement and insight appears at baseline. Flat mood and affect    Data Reviewed: I have personally reviewed following labs and imaging studies  CBC: Recent Labs  Lab 09/24/24 1652 09/25/24 0500  WBC 9.0 9.2  HGB 13.4 12.4*  HCT 40.3 36.6*  MCV 77.9* 78.4*  PLT 220 230   Basic Metabolic Panel: Recent Labs  Lab 09/24/24 1652 09/24/24 2023 09/25/24 0500 09/25/24 0959 09/26/24 0423 09/27/24 0549  NA 124* 127* 128*  --  132* 135  K 4.1 3.7 3.3*  --  3.8 3.4*  CL 88* 90* 91*  --  96* 101  CO2 22 23 22   --  22 21*  GLUCOSE 126* 111* 104*  --  93 90  BUN 25* 24* 20  --  13 10  CREATININE 1.55* 1.45* 1.26*  --  1.08 0.92  CALCIUM 8.9 8.3* 8.0*  --  8.0*  8.2*  MG  --  2.2  --  2.3  --   --   PHOS  --  2.4*  --   --   --   --    GFR: Estimated Creatinine Clearance: 102.5 mL/min (by C-G formula based on SCr of 0.92 mg/dL). Liver Function Tests: Recent Labs  Lab 09/25/24 0959 09/27/24 0549  AST 95* 218*  ALT 36 91*  ALKPHOS 80 94  BILITOT 0.4 0.4  PROT 6.4* 6.2*  ALBUMIN 2.9* 2.7*   No results for input(s): LIPASE, AMYLASE in the last 168 hours. No results for input(s): AMMONIA in the last 168 hours. Coagulation Profile: No results for input(s): INR, PROTIME in the last 168 hours. Cardiac Enzymes: No results for input(s): CKTOTAL, CKMB, CKMBINDEX, TROPONINI in the last 168 hours. BNP (last 3 results) No results for input(s): PROBNP in the last 8760  hours. HbA1C: No results for input(s): HGBA1C in the last 72 hours. CBG: No results for input(s): GLUCAP in the last 168 hours. Lipid Profile: No results for input(s): CHOL, HDL, LDLCALC, TRIG, CHOLHDL, LDLDIRECT in the last 72 hours. Thyroid Function Tests: No results for input(s): TSH, T4TOTAL, FREET4, T3FREE, THYROIDAB in the last 72 hours. Anemia Panel: No results for input(s): VITAMINB12, FOLATE, FERRITIN, TIBC, IRON, RETICCTPCT in the last 72 hours. Sepsis Labs: Recent Labs  Lab 09/24/24 1652  LATICACIDVEN 1.6    Recent Results (from the past 240 hours)  Resp panel by RT-PCR (RSV, Flu A&B, Covid) Anterior Nasal Swab     Status: None   Collection Time: 09/24/24  4:52 PM   Specimen: Anterior Nasal Swab  Result Value Ref Range Status   SARS Coronavirus 2 by RT PCR NEGATIVE NEGATIVE Final    Comment: (NOTE) SARS-CoV-2 target nucleic acids are NOT DETECTED.  The SARS-CoV-2 RNA is generally detectable in upper respiratory specimens during the acute phase of infection. The lowest concentration of SARS-CoV-2 viral copies this assay can detect is 138 copies/mL. A negative result does not preclude SARS-Cov-2 infection and should not be used as the sole basis for treatment or other patient management decisions. A negative result may occur with  improper specimen collection/handling, submission of specimen other than nasopharyngeal swab, presence of viral mutation(s) within the areas targeted by this assay, and inadequate number of viral copies(<138 copies/mL). A negative result must be combined with clinical observations, patient history, and epidemiological information. The expected result is Negative.  Fact Sheet for Patients:  bloggercourse.com  Fact Sheet for Healthcare Providers:  seriousbroker.it  This test is no t yet approved or cleared by the [place]  FDA and  has been  authorized for detection and/or diagnosis of SARS-CoV-2 by FDA under an Emergency Use Authorization (EUA). This EUA will remain  in effect (meaning this test can be used) for the duration of the COVID-19 declaration under Section 564(b)(1) of the Act, 21 U.S.C.section 360bbb-3(b)(1), unless the authorization is terminated  or revoked sooner.       Influenza A by PCR NEGATIVE NEGATIVE Final   Influenza B by PCR NEGATIVE NEGATIVE Final    Comment: (NOTE) The Xpert Xpress SARS-CoV-2/FLU/RSV plus assay is intended as an aid in the diagnosis of influenza from Nasopharyngeal swab specimens and should not be used as a sole basis for treatment. Nasal washings and aspirates are unacceptable for Xpert Xpress SARS-CoV-2/FLU/RSV testing.  Fact Sheet for Patients: bloggercourse.com  Fact Sheet for Healthcare Providers: seriousbroker.it  This test is not yet approved or cleared by the [place]   FDA and has been authorized for detection and/or diagnosis of SARS-CoV-2 by FDA under an Emergency Use Authorization (EUA). This EUA will remain in effect (meaning this test can be used) for the duration of the COVID-19 declaration under Section 564(b)(1) of the Act, 21 U.S.C. section 360bbb-3(b)(1), unless the authorization is terminated or revoked.     Resp Syncytial Virus by PCR NEGATIVE NEGATIVE Final    Comment: (NOTE) Fact Sheet for Patients: bloggercourse.com  Fact Sheet for Healthcare Providers: seriousbroker.it  This test is not yet approved or cleared by the [place]  FDA and has been authorized for detection and/or diagnosis of SARS-CoV-2 by FDA under an Emergency Use Authorization (EUA). This EUA will remain in effect (meaning this test can be used) for the duration of the COVID-19 declaration under Section 564(b)(1) of the Act, 21 U.S.C. section 360bbb-3(b)(1), unless the  authorization is terminated or revoked.  Performed at [facility], [address] [place], [place] [address]   Blood culture (routine x 2)     Status: None (Preliminary result)   Collection Time: 09/24/24  4:52 PM   Specimen: BLOOD  Result Value Ref Range Status   Specimen Description BLOOD BLOOD RIGHT HAND  Final   Special Requests   Final    BOTTLES DRAWN AEROBIC AND ANAEROBIC Blood Culture results may not be optimal due to an inadequate volume of blood received in culture bottles   Culture   Final    NO GROWTH 2 DAYS Performed at [facility], [address], [place], [place] [address]    Report Status PENDING  Incomplete  Blood culture (routine x 2)     Status: None (Preliminary result)   Collection Time: 09/24/24  4:52 PM   Specimen: BLOOD  Result Value Ref Range Status   Specimen Description BLOOD BLOOD RIGHT FOREARM  Final   Special Requests   Final    BOTTLES DRAWN AEROBIC AND ANAEROBIC Blood Culture results may not be optimal due to an inadequate volume of blood received in culture bottles   Culture   Final    NO GROWTH 2 DAYS Performed at [facility], [address] [place], [place] [address]    Report Status PENDING  Incomplete  C Difficile Quick Screen w PCR reflex     Status: None   Collection Time: 09/25/24  2:04 AM   Specimen: STOOL  Result Value Ref Range Status   C Diff antigen NEGATIVE NEGATIVE Final   C Diff toxin NEGATIVE NEGATIVE Final   C Diff interpretation No C. difficile detected.  Final    Comment: Performed at [facility], [address] [place], [place] [address]  Gastrointestinal Panel by PCR , Stool     Status: Abnormal   Collection Time: 09/25/24  2:04 AM   Specimen: STOOL  Result Value Ref Range Status   Campylobacter species NOT DETECTED NOT DETECTED Final   Plesimonas shigelloides NOT DETECTED NOT DETECTED Final   Salmonella species NOT DETECTED NOT DETECTED Final   Yersinia enterocolitica NOT  DETECTED NOT DETECTED Final   Vibrio species NOT DETECTED NOT DETECTED Final   Vibrio cholerae NOT DETECTED NOT DETECTED Final   Enteroaggregative E coli (EAEC) NOT DETECTED NOT DETECTED Final   Enteropathogenic E coli (EPEC) NOT DETECTED NOT DETECTED Final   Enterotoxigenic E coli (ETEC) NOT DETECTED NOT DETECTED Final   Shiga like toxin producing E coli (STEC) NOT DETECTED NOT DETECTED Final   Shigella/Enteroinvasive E coli (EIEC) NOT DETECTED NOT  DETECTED Final   Cryptosporidium NOT DETECTED NOT DETECTED Final   Cyclospora cayetanensis NOT DETECTED NOT DETECTED Final   Entamoeba histolytica NOT DETECTED NOT DETECTED Final   Giardia lamblia NOT DETECTED NOT DETECTED Final   Adenovirus F40/41 NOT DETECTED NOT DETECTED Final   Astrovirus NOT DETECTED NOT DETECTED Final   Norovirus GI/GII DETECTED (A) NOT DETECTED Final    Comment: RESULT CALLED TO, READ BACK BY AND VERIFIED WITH: CLEOPATRA RUFANDIRORI, RN @0438  09/25/2024 COP    Rotavirus A NOT DETECTED NOT DETECTED Final   Sapovirus (I, II, IV, and V) NOT DETECTED NOT DETECTED Final    Comment: Performed at [facility], [address], [place], [place] [address]         Radiology Studies: No results found.       Scheduled Meds:  azithromycin   500 mg Oral Daily   citalopram   10 mg Oral Daily   enoxaparin  (LOVENOX ) injection  40 mg Subcutaneous Q24H   folic acid   1 mg Oral Daily   ipratropium-albuterol   3 mL Nebulization BID   mirtazapine   15 mg Oral QHS   multivitamin with minerals  1 tablet Oral Daily   nicotine   21 mg Transdermal Daily   potassium chloride   60 mEq Oral Once   sodium chloride   1 g Oral BID WC   thiamine   100 mg Oral Daily   Or   thiamine   100 mg Intravenous Daily   Continuous Infusions:  sodium chloride  100 mL/hr at 09/27/24 0525   cefTRIAXone  (ROCEPHIN )  IV Stopped (09/26/24 2045)     LOS: 3 days       Anthony CHRISTELLA Pouch, MD [facility] Pager [phone]  If  7PM-7AM, please contact night-coverage [email or web address] 09/27/2024, 8:31 AM   "

## 2024-09-28 LAB — LEGIONELLA PNEUMOPHILA SEROGP 1 UR AG: L. pneumophila Serogp 1 Ur Ag: POSITIVE — AB

## 2024-09-28 NOTE — Progress Notes (Addendum)
 " PROGRESS NOTE   HPI was taken from Dr. Hilma: Travis Leach is a 48 y.o. male with medical history significant of polysubstance abuse (cocaine, marijuana, tobacco and alcohol), HTN,  HLD, asthma, depression, delirium treatment, pancreatitis, hyponatremia, decreased libido, who presents with SOB and diarrhea.   Patient states he has been sick for more than 5 days.  Symptoms include productive cough with yellow-colored sputum production, mild subjective fever, chills, SOB which has been progressively worsening.  No chest pain.  He also reports generalized weakness and fatigue.  No sore throat.  He has nausea and diarrhea, no vomiting or abdominal pain.  No symptoms of UTI.  Patient states that he has many episodes of watery diarrhea each day in the past 5 days.   Data reviewed independently and ED Course: pt was found to have WBC 9.0, negative PCR for COVID, flu and RSV, AKI, sodium 124, lactic acid 1.6. Temperature 99.5, blood pressure 150/89, heart rate 110-130s, RR 24, oxygen saturation 88% room air, which improved to 95% on 2 L oxygen (patient is not using oxygen at hide baseline).  Chest x-ray showed right basilar infiltration.  Patient is admitted to telemetry bed as inpatient.     Travis Leach  FMW:[id number] DOB: [birth year] DOA: 09/24/2024 PCP: Iloabachie, Chioma E, NP    Assessment & Plan:   Principal Problem:   Norovirus Active Problems:   CAP (community acquired pneumonia)   Asthma   Hypertension   HLD (hyperlipidemia)   AKI (acute kidney injury)   Hyponatremia   Diarrhea   Alcohol abuse   Nicotine  dependence   Depression   Hypophosphatemia   Cocaine abuse (HCC)  Assessment and Plan: CAP: of RLL.Continue on IV rocephin , azithromycin  x 5 days total, bronchodilators & encourage incentive spirometry. COVID/influenza, RSV all neg. Resp viral panel ordered but still not collected   Norovirus infection: w/ fever, diarrhea. No fevers w/in 24 hrs. Diarrhea episodes have been  decreasing   Legionella infection: came positive back this evening. Continue on azithromycin . ID needs to be consulted   Hyponatremia: resolved   Acute hypoxic respiratory failure: O2 88% in ER and dyspneic. Weaned off of supplemental oxygen    Hypokalemia: will re-check in AM     AKI: resolved    Cocaine abuse: reports intermittent use of crack cocaine  Alcohol abuse: w/ hx of DTs. No signs or symptoms of w/drawal. Will continue to monitor    HTN: WNL currently. Holding home dose of amlodipine     Major depression: severity unknown. Continue on home dose of citalopram , mirtazapine           DVT prophylaxis: lovenox   Code Status: full  Family Communication:  Disposition Plan: likely d/c back home  Level of care: Telemetry  Status is: Inpatient Remains inpatient appropriate because: d/c home tomorrow as pt requested to stay 1 day more for his elevated temps & diarrhea     Consultants:    Procedures:   Antimicrobials: azithromycin , rocephin    Subjective: Pt c/o sweating and diarrhea   Objective: Vitals:   09/27/24 0746 09/27/24 1625 09/27/24 2105 09/28/24 0356  BP: 133/76 130/82 133/80 124/84  Pulse: (!) 102 100  98  Resp: 18 17 18 16   Temp: 98.9 F (37.2 C) 99.5 F (37.5 C) 99.4 F (37.4 C) 99.6 F (37.6 C)  TempSrc: Oral Oral  Oral  SpO2: 97% 94% 95% 93%  Weight:      Height:        Intake/Output Summary (Last 24  hours) at 09/28/2024 0859 Last data filed at 09/28/2024 0358 Gross per 24 hour  Intake 440 ml  Output 1150 ml  Net -710 ml   Filed Weights   09/24/24 1610  Weight: 83.9 kg    Examination:  General exam: appears calm & comfortable  Respiratory system: diminished breath sounds b/l  Cardiovascular system: S1 & S2+. No rubs or clicks  Gastrointestinal system: abd is soft, NT, ND & hyperactive bowel sounds Central nervous system: alert & oriented. Moves all extremities   Psychiatry: Judgement and insight appears at baseline. Flat  mood and affect    Data Reviewed: I have personally reviewed following labs and imaging studies  CBC: Recent Labs  Lab 09/24/24 1652 09/25/24 0500  WBC 9.0 9.2  HGB 13.4 12.4*  HCT 40.3 36.6*  MCV 77.9* 78.4*  PLT 220 230   Basic Metabolic Panel: Recent Labs  Lab 09/24/24 1652 09/24/24 2023 09/25/24 0500 09/25/24 0959 09/26/24 0423 09/27/24 0549  NA 124* 127* 128*  --  132* 135  K 4.1 3.7 3.3*  --  3.8 3.4*  CL 88* 90* 91*  --  96* 101  CO2 22 23 22   --  22 21*  GLUCOSE 126* 111* 104*  --  93 90  BUN 25* 24* 20  --  13 10  CREATININE 1.55* 1.45* 1.26*  --  1.08 0.92  CALCIUM 8.9 8.3* 8.0*  --  8.0* 8.2*  MG  --  2.2  --  2.3  --   --   PHOS  --  2.4*  --   --   --   --    GFR: Estimated Creatinine Clearance: 102.5 mL/min (by C-G formula based on SCr of 0.92 mg/dL). Liver Function Tests: Recent Labs  Lab 09/25/24 0959 09/27/24 0549  AST 95* 218*  ALT 36 91*  ALKPHOS 80 94  BILITOT 0.4 0.4  PROT 6.4* 6.2*  ALBUMIN 2.9* 2.7*   No results for input(s): LIPASE, AMYLASE in the last 168 hours. No results for input(s): AMMONIA in the last 168 hours. Coagulation Profile: No results for input(s): INR, PROTIME in the last 168 hours. Cardiac Enzymes: No results for input(s): CKTOTAL, CKMB, CKMBINDEX, TROPONINI in the last 168 hours. BNP (last 3 results) No results for input(s): PROBNP in the last 8760 hours. HbA1C: No results for input(s): HGBA1C in the last 72 hours. CBG: No results for input(s): GLUCAP in the last 168 hours. Lipid Profile: No results for input(s): CHOL, HDL, LDLCALC, TRIG, CHOLHDL, LDLDIRECT in the last 72 hours. Thyroid Function Tests: No results for input(s): TSH, T4TOTAL, FREET4, T3FREE, THYROIDAB in the last 72 hours. Anemia Panel: No results for input(s): VITAMINB12, FOLATE, FERRITIN, TIBC, IRON, RETICCTPCT in the last 72 hours. Sepsis Labs: Recent Labs  Lab 09/24/24 1652   LATICACIDVEN 1.6    Recent Results (from the past 240 hours)  Resp panel by RT-PCR (RSV, Flu A&B, Covid) Anterior Nasal Swab     Status: None   Collection Time: 09/24/24  4:52 PM   Specimen: Anterior Nasal Swab  Result Value Ref Range Status   SARS Coronavirus 2 by RT PCR NEGATIVE NEGATIVE Final    Comment: (NOTE) SARS-CoV-2 target nucleic acids are NOT DETECTED.  The SARS-CoV-2 RNA is generally detectable in upper respiratory specimens during the acute phase of infection. The lowest concentration of SARS-CoV-2 viral copies this assay can detect is 138 copies/mL. A negative result does not preclude SARS-Cov-2 infection and should not be used as the sole  basis for treatment or other patient management decisions. A negative result may occur with  improper specimen collection/handling, submission of specimen other than nasopharyngeal swab, presence of viral mutation(s) within the areas targeted by this assay, and inadequate number of viral copies(<138 copies/mL). A negative result must be combined with clinical observations, patient history, and epidemiological information. The expected result is Negative.  Fact Sheet for Patients:  bloggercourse.com  Fact Sheet for Healthcare Providers:  seriousbroker.it  This test is no t yet approved or cleared by the [place]  FDA and  has been authorized for detection and/or diagnosis of SARS-CoV-2 by FDA under an Emergency Use Authorization (EUA). This EUA will remain  in effect (meaning this test can be used) for the duration of the COVID-19 declaration under Section 564(b)(1) of the Act, 21 U.S.C.section 360bbb-3(b)(1), unless the authorization is terminated  or revoked sooner.       Influenza A by PCR NEGATIVE NEGATIVE Final   Influenza B by PCR NEGATIVE NEGATIVE Final    Comment: (NOTE) The Xpert Xpress SARS-CoV-2/FLU/RSV plus assay is intended as an aid in the diagnosis of  influenza from Nasopharyngeal swab specimens and should not be used as a sole basis for treatment. Nasal washings and aspirates are unacceptable for Xpert Xpress SARS-CoV-2/FLU/RSV testing.  Fact Sheet for Patients: bloggercourse.com  Fact Sheet for Healthcare Providers: seriousbroker.it  This test is not yet approved or cleared by the [place]  FDA and has been authorized for detection and/or diagnosis of SARS-CoV-2 by FDA under an Emergency Use Authorization (EUA). This EUA will remain in effect (meaning this test can be used) for the duration of the COVID-19 declaration under Section 564(b)(1) of the Act, 21 U.S.C. section 360bbb-3(b)(1), unless the authorization is terminated or revoked.     Resp Syncytial Virus by PCR NEGATIVE NEGATIVE Final    Comment: (NOTE) Fact Sheet for Patients: bloggercourse.com  Fact Sheet for Healthcare Providers: seriousbroker.it  This test is not yet approved or cleared by the [place]  FDA and has been authorized for detection and/or diagnosis of SARS-CoV-2 by FDA under an Emergency Use Authorization (EUA). This EUA will remain in effect (meaning this test can be used) for the duration of the COVID-19 declaration under Section 564(b)(1) of the Act, 21 U.S.C. section 360bbb-3(b)(1), unless the authorization is terminated or revoked.  Performed at [facility], [address] [place], [place] [address]   Blood culture (routine x 2)     Status: None (Preliminary result)   Collection Time: 09/24/24  4:52 PM   Specimen: BLOOD  Result Value Ref Range Status   Specimen Description BLOOD BLOOD RIGHT HAND  Final   Special Requests   Final    BOTTLES DRAWN AEROBIC AND ANAEROBIC Blood Culture results may not be optimal due to an inadequate volume of blood received in culture bottles   Culture   Final    NO GROWTH 4 DAYS Performed  at [facility], [address], [place], [place] [address]    Report Status PENDING  Incomplete  Blood culture (routine x 2)     Status: None (Preliminary result)   Collection Time: 09/24/24  4:52 PM   Specimen: BLOOD  Result Value Ref Range Status   Specimen Description BLOOD BLOOD RIGHT FOREARM  Final   Special Requests   Final    BOTTLES DRAWN AEROBIC AND ANAEROBIC Blood Culture results may not be optimal due to an inadequate volume of blood received in culture bottles   Culture  Final    NO GROWTH 4 DAYS Performed at [facility], [address] [place], [place] [address]    Report Status PENDING  Incomplete  C Difficile Quick Screen w PCR reflex     Status: None   Collection Time: 09/25/24  2:04 AM   Specimen: STOOL  Result Value Ref Range Status   C Diff antigen NEGATIVE NEGATIVE Final   C Diff toxin NEGATIVE NEGATIVE Final   C Diff interpretation No C. difficile detected.  Final    Comment: Performed at [facility], [address] [place], [place] [address]  Gastrointestinal Panel by PCR , Stool     Status: Abnormal   Collection Time: 09/25/24  2:04 AM   Specimen: STOOL  Result Value Ref Range Status   Campylobacter species NOT DETECTED NOT DETECTED Final   Plesimonas shigelloides NOT DETECTED NOT DETECTED Final   Salmonella species NOT DETECTED NOT DETECTED Final   Yersinia enterocolitica NOT DETECTED NOT DETECTED Final   Vibrio species NOT DETECTED NOT DETECTED Final   Vibrio cholerae NOT DETECTED NOT DETECTED Final   Enteroaggregative E coli (EAEC) NOT DETECTED NOT DETECTED Final   Enteropathogenic E coli (EPEC) NOT DETECTED NOT DETECTED Final   Enterotoxigenic E coli (ETEC) NOT DETECTED NOT DETECTED Final   Shiga like toxin producing E coli (STEC) NOT DETECTED NOT DETECTED Final   Shigella/Enteroinvasive E coli (EIEC) NOT DETECTED NOT DETECTED Final   Cryptosporidium NOT DETECTED NOT DETECTED Final   Cyclospora cayetanensis NOT  DETECTED NOT DETECTED Final   Entamoeba histolytica NOT DETECTED NOT DETECTED Final   Giardia lamblia NOT DETECTED NOT DETECTED Final   Adenovirus F40/41 NOT DETECTED NOT DETECTED Final   Astrovirus NOT DETECTED NOT DETECTED Final   Norovirus GI/GII DETECTED (A) NOT DETECTED Final    Comment: RESULT CALLED TO, READ BACK BY AND VERIFIED WITH: CLEOPATRA RUFANDIRORI, RN @0438  09/25/2024 COP    Rotavirus A NOT DETECTED NOT DETECTED Final   Sapovirus (I, II, IV, and V) NOT DETECTED NOT DETECTED Final    Comment: Performed at [facility], [address] [place], [place] [address]         Radiology Studies: No results found.       Scheduled Meds:  azithromycin   500 mg Oral Daily   citalopram   10 mg Oral Daily   enoxaparin  (LOVENOX ) injection  40 mg Subcutaneous Q24H   folic acid   1 mg Oral Daily   mirtazapine   15 mg Oral QHS   multivitamin with minerals  1 tablet Oral Daily   nicotine   21 mg Transdermal Daily   sodium chloride   1 g Oral BID WC   thiamine   100 mg Oral Daily   Or   thiamine   100 mg Intravenous Daily   Continuous Infusions:  sodium chloride  100 mL/hr at 09/27/24 0525   cefTRIAXone  (ROCEPHIN )  IV 1 g (09/27/24 1737)     LOS: 4 days       Anthony CHRISTELLA Pouch, MD [facility] Pager [phone]  If 7PM-7AM, please contact night-coverage [email or web address] 09/28/2024, 8:59 AM   "

## 2024-09-29 ENCOUNTER — Other Ambulatory Visit: Payer: Self-pay

## 2024-09-29 LAB — HCV INTERPRETATION

## 2024-09-29 LAB — HCV AB W REFLEX TO QUANT PCR: HCV Ab: NONREACTIVE

## 2024-09-29 LAB — CULTURE, BLOOD (ROUTINE X 2)
Culture: NO GROWTH
Culture: NO GROWTH

## 2024-09-29 MED ORDER — DM-GUAIFENESIN ER 60-1200 MG PO TB12
1.0000 | ORAL_TABLET | Freq: Two times a day (BID) | ORAL | 0 refills | Status: AC
  Filled 2024-09-29 (×2): qty 10, 5d supply, fill #0

## 2024-09-29 MED ORDER — POTASSIUM CHLORIDE CRYS ER 20 MEQ PO TBCR
40.0000 meq | EXTENDED_RELEASE_TABLET | Freq: Once | ORAL | Status: AC
  Administered 2024-09-29: 40 meq via ORAL
  Filled 2024-09-29: qty 2

## 2024-09-29 MED ORDER — VITAMIN B-1 100 MG PO TABS
100.0000 mg | ORAL_TABLET | Freq: Every day | ORAL | 0 refills | Status: DC
  Filled 2024-09-29: qty 30, 30d supply, fill #0

## 2024-09-29 NOTE — Discharge Summary (Signed)
 " Physician Discharge Summary   Patient: Travis Leach MRN: [id number] DOB: [birth year]  Admit date:     09/24/2024  Discharge date: 09/29/2024  Discharge Physician: Travis Leach   PCP: [place], Chioma E, NP   Recommendations at discharge:  Follow-up with PCP  Discharge Diagnoses: Principal Problem:   Norovirus Active Problems:   CAP (community acquired pneumonia)   Asthma   Hypertension   HLD (hyperlipidemia)   AKI (acute kidney injury)   Hyponatremia   Diarrhea   Alcohol abuse   Nicotine  dependence   Depression   Hypophosphatemia   Cocaine abuse (HCC)  Resolved Problems:   * No resolved hospital problems. *  Hospital Course:  Travis Leach is a 48 y.o. male with medical history significant of polysubstance abuse (cocaine, marijuana, tobacco and alcohol), HTN,  HLD, asthma, depression, delirium treatment, pancreatitis, hyponatremia, decreased libido, who presents with SOB and diarrhea.  Data reviewed independently and ED Course: pt was found to have WBC 9.0, negative PCR for COVID, flu and RSV, AKI, sodium 124, lactic acid 1.6. Temperature 99.5, blood pressure 150/89, heart rate 110-130s, RR 24, oxygen saturation 88% room air, which improved to 95% on 2 L oxygen (patient is not using oxygen at hide baseline).  Chest x-ray showed right basilar infiltration.  Patient is admitted to telemetry bed as inpatient. Patient's respiratory function improved, and have now been weaned off oxygen and doing much better today.  He is therefore being discharged today to follow up with primary care physician.    Consultants: None Procedures performed: None Disposition: Home Diet recommendation:  Cardiac diet DISCHARGE MEDICATION: Allergies as of 09/29/2024       Reactions   Bee Venom Swelling        Medication List     TAKE these medications    albuterol  108 (90 Base) MCG/ACT inhaler Commonly known as: VENTOLIN  HFA Inhale 2 puffs into the lungs every 6 (six) hours as needed  for wheezing or shortness of breath.   amLODipine  10 MG tablet Commonly known as: NORVASC  Take 1 tablet (10 mg total) by mouth once daily.   citalopram  10 MG tablet Commonly known as: CELEXA  Take 1 tablet (10 mg total) by mouth daily.   folic acid  1 MG tablet Commonly known as: FOLVITE  Take 1 tablet (1 mg total) by mouth once daily.   FT Mucus Relief DM 1200-60 MG Tb12 Take 1 tablet by mouth 2 (two) times daily for 5 days.   mirtazapine  15 MG tablet Commonly known as: REMERON  Take 1 tablet (15 mg total) by mouth at bedtime.   simvastatin  20 MG tablet Commonly known as: ZOCOR  Take 1 tablet (20 mg total) by mouth daily.   thiamine  100 MG tablet Commonly known as: VITAMIN B1 Take 1 tablet (100 mg total) by mouth daily.        Discharge Exam: Filed Weights   09/24/24 1610  Weight: 83.9 kg   General exam: appears calm & comfortable  Respiratory system: Clear to auscultation bilaterally Cardiovascular system: S1 & S2+. No rubs or clicks  Gastrointestinal system: abd is soft, NT, ND & hyperactive bowel sounds Central nervous system: alert & oriented. Moves all extremities   Psychiatry: Judgement and insight appears at baseline. Flat mood and affect  Condition at discharge: good  The results of significant diagnostics from this hospitalization (including imaging, microbiology, ancillary and laboratory) are listed below for reference.   Imaging Studies: DG Chest Port 1 View Result Date: 09/24/2024 EXAM: 1 VIEW(S)  XRAY OF THE CHEST 09/24/2024 04:22:00 PM COMPARISON: None available. CLINICAL HISTORY: SOB (shortness of breath) FINDINGS: LUNGS AND PLEURA: Hazy opacity at right lung base involving right middle and/or lower lobes. Possible small right pleural effusion. No pneumothorax. HEART AND MEDIASTINUM: No acute abnormality of the cardiac and mediastinal silhouettes. BONES AND SOFT TISSUES: No acute osseous abnormality. IMPRESSION: 1. Hazy opacity at the right lung base,  worrisome for pneumonia. 2. Possible small right pleural effusion. Electronically signed by: Greig Pique MD 09/24/2024 05:24 PM EST RP Workstation: [id number]    Microbiology: Results for orders placed or performed during the hospital encounter of 09/24/24  Resp panel by RT-PCR (RSV, Flu A&B, Covid) Anterior Nasal Swab     Status: None   Collection Time: 09/24/24  4:52 PM   Specimen: Anterior Nasal Swab  Result Value Ref Range Status   SARS Coronavirus 2 by RT PCR NEGATIVE NEGATIVE Final    Comment: (NOTE) SARS-CoV-2 target nucleic acids are NOT DETECTED.  The SARS-CoV-2 RNA is generally detectable in upper respiratory specimens during the acute phase of infection. The lowest concentration of SARS-CoV-2 viral copies this assay can detect is 138 copies/mL. A negative result does not preclude SARS-Cov-2 infection and should not be used as the sole basis for treatment or other patient management decisions. A negative result may occur with  improper specimen collection/handling, submission of specimen other than nasopharyngeal swab, presence of viral mutation(s) within the areas targeted by this assay, and inadequate number of viral copies(<138 copies/mL). A negative result must be combined with clinical observations, patient history, and epidemiological information. The expected result is Negative.  Fact Sheet for Patients:  bloggercourse.com  Fact Sheet for Healthcare Providers:  seriousbroker.it  This test is no Travis yet approved or cleared by the [place]  FDA and  has been authorized for detection and/or diagnosis of SARS-CoV-2 by FDA under an Emergency Use Authorization (EUA). This EUA will remain  in effect (meaning this test can be used) for the duration of the COVID-19 declaration under Section 564(b)(1) of the Act, 21 U.S.C.section 360bbb-3(b)(1), unless the authorization is terminated  or revoked sooner.        Influenza A by PCR NEGATIVE NEGATIVE Final   Influenza B by PCR NEGATIVE NEGATIVE Final    Comment: (NOTE) The Xpert Xpress SARS-CoV-2/FLU/RSV plus assay is intended as an aid in the diagnosis of influenza from Nasopharyngeal swab specimens and should not be used as a sole basis for treatment. Nasal washings and aspirates are unacceptable for Xpert Xpress SARS-CoV-2/FLU/RSV testing.  Fact Sheet for Patients: bloggercourse.com  Fact Sheet for Healthcare Providers: seriousbroker.it  This test is not yet approved or cleared by the [place]  FDA and has been authorized for detection and/or diagnosis of SARS-CoV-2 by FDA under an Emergency Use Authorization (EUA). This EUA will remain in effect (meaning this test can be used) for the duration of the COVID-19 declaration under Section 564(b)(1) of the Act, 21 U.S.C. section 360bbb-3(b)(1), unless the authorization is terminated or revoked.     Resp Syncytial Virus by PCR NEGATIVE NEGATIVE Final    Comment: (NOTE) Fact Sheet for Patients: bloggercourse.com  Fact Sheet for Healthcare Providers: seriousbroker.it  This test is not yet approved or cleared by the [place]  FDA and has been authorized for detection and/or diagnosis of SARS-CoV-2 by FDA under an Emergency Use Authorization (EUA). This EUA will remain in effect (meaning this test can be used) for the duration of the COVID-19 declaration under Section 564(b)(1) of the  Act, 21 U.S.C. section 360bbb-3(b)(1), unless the authorization is terminated or revoked.  Performed at [facility], [address] [place], [place] [address]   Blood culture (routine x 2)     Status: None   Collection Time: 09/24/24  4:52 PM   Specimen: BLOOD  Result Value Ref Range Status   Specimen Description BLOOD BLOOD RIGHT HAND  Final   Special Requests   Final    BOTTLES DRAWN  AEROBIC AND ANAEROBIC Blood Culture results may not be optimal due to an inadequate volume of blood received in culture bottles   Culture   Final    NO GROWTH 5 DAYS Performed at [facility], [address] [place], [place] [address]    Report Status 09/29/2024 FINAL  Final  Blood culture (routine x 2)     Status: None   Collection Time: 09/24/24  4:52 PM   Specimen: BLOOD  Result Value Ref Range Status   Specimen Description BLOOD BLOOD RIGHT FOREARM  Final   Special Requests   Final    BOTTLES DRAWN AEROBIC AND ANAEROBIC Blood Culture results may not be optimal due to an inadequate volume of blood received in culture bottles   Culture   Final    NO GROWTH 5 DAYS Performed at [facility], [address], [place], [place] [address]    Report Status 09/29/2024 FINAL  Final  C Difficile Quick Screen w PCR reflex     Status: None   Collection Time: 09/25/24  2:04 AM   Specimen: STOOL  Result Value Ref Range Status   C Diff antigen NEGATIVE NEGATIVE Final   C Diff toxin NEGATIVE NEGATIVE Final   C Diff interpretation No C. difficile detected.  Final    Comment: Performed at [facility], [address] [place], [place] [address]  Gastrointestinal Panel by PCR , Stool     Status: Abnormal   Collection Time: 09/25/24  2:04 AM   Specimen: STOOL  Result Value Ref Range Status   Campylobacter species NOT DETECTED NOT DETECTED Final   Plesimonas shigelloides NOT DETECTED NOT DETECTED Final   Salmonella species NOT DETECTED NOT DETECTED Final   Yersinia enterocolitica NOT DETECTED NOT DETECTED Final   Vibrio species NOT DETECTED NOT DETECTED Final   Vibrio cholerae NOT DETECTED NOT DETECTED Final   Enteroaggregative Leach coli (EAEC) NOT DETECTED NOT DETECTED Final   Enteropathogenic Leach coli (EPEC) NOT DETECTED NOT DETECTED Final   Enterotoxigenic Leach coli (ETEC) NOT DETECTED NOT DETECTED Final   Shiga like toxin producing Leach coli (STEC) NOT DETECTED NOT DETECTED  Final   Shigella/Enteroinvasive Leach coli (EIEC) NOT DETECTED NOT DETECTED Final   Cryptosporidium NOT DETECTED NOT DETECTED Final   Cyclospora cayetanensis NOT DETECTED NOT DETECTED Final   Entamoeba histolytica NOT DETECTED NOT DETECTED Final   Giardia lamblia NOT DETECTED NOT DETECTED Final   Adenovirus F40/41 NOT DETECTED NOT DETECTED Final   Astrovirus NOT DETECTED NOT DETECTED Final   Norovirus GI/GII DETECTED (A) NOT DETECTED Final    Comment: RESULT CALLED TO, READ BACK BY AND VERIFIED WITH: CLEOPATRA RUFANDIRORI, RN @[phone]  09/25/2024 COP    Rotavirus A NOT DETECTED NOT DETECTED Final   Sapovirus (I, II, IV, and V) NOT DETECTED NOT DETECTED Final    Comment: Performed at [facility], [address] [place], [place] [address]    Labs: CBC: Recent Labs  Lab 09/24/24 1652 09/25/24 0500  WBC 9.0 9.2  HGB 13.4 12.4*  HCT 40.3 36.6*  MCV 77.9* 78.4*  PLT 220 230   Basic Metabolic Panel: Recent Labs  Lab 09/24/24 1652 09/24/24 2023 09/25/24 0500 09/25/24 0959 09/26/24 0423 09/27/24 0549  NA 124* 127* 128*  --  132* 135  K 4.1 3.7 3.3*  --  3.8 3.4*  CL 88* 90* 91*  --  96* 101  CO2 22 23 22   --  22 21*  GLUCOSE 126* 111* 104*  --  93 90  BUN 25* 24* 20  --  13 10  CREATININE 1.55* 1.45* 1.26*  --  1.08 0.92  CALCIUM 8.9 8.3* 8.0*  --  8.0* 8.2*  MG  --  2.2  --  2.3  --   --   PHOS  --  2.4*  --   --   --   --    Liver Function Tests: Recent Labs  Lab 09/25/24 0959 09/27/24 0549  AST 95* 218*  ALT 36 91*  ALKPHOS 80 94  BILITOT 0.4 0.4  PROT 6.4* 6.2*  ALBUMIN 2.9* 2.7*   CBG: No results for input(s): GLUCAP in the last 168 hours.  Discharge time spent:  38 minutes.  Signed: Drue ONEIDA Potter, MD [facility] 09/29/2024 "

## 2024-10-12 ENCOUNTER — Other Ambulatory Visit: Payer: Self-pay

## 2024-10-12 DIAGNOSIS — R6882 Decreased libido: Secondary | ICD-10-CM

## 2024-10-13 LAB — URINALYSIS
Bilirubin, UA: NEGATIVE
Glucose, UA: NEGATIVE
Leukocytes,UA: NEGATIVE
Nitrite, UA: NEGATIVE
RBC, UA: NEGATIVE
Specific Gravity, UA: 1.026 (ref 1.005–1.030)
Urobilinogen, Ur: 1 mg/dL (ref 0.2–1.0)
pH, UA: 5.5 (ref 5.0–7.5)

## 2024-10-13 LAB — PSA, TOTAL AND FREE
PSA, Free Pct: 8.2 %
PSA, Free: 0.09 ng/mL
Prostate Specific Ag, Serum: 1.1 ng/mL (ref 0.0–4.0)

## 2024-10-13 LAB — TESTOSTERONE: Testosterone: 845 ng/dL (ref 264–916)

## 2024-10-19 ENCOUNTER — Ambulatory Visit: Payer: Self-pay | Admitting: Gerontology

## 2024-10-19 ENCOUNTER — Telehealth: Payer: Self-pay

## 2024-10-19 NOTE — Telephone Encounter (Signed)
 No answer left Vm to confirm appt [facility] 10/21/24

## 2024-10-21 ENCOUNTER — Ambulatory Visit: Payer: Self-pay | Admitting: Gerontology

## 2024-10-28 ENCOUNTER — Ambulatory Visit: Payer: Self-pay | Admitting: Gerontology

## 2024-11-01 ENCOUNTER — Other Ambulatory Visit: Payer: Self-pay | Admitting: Gerontology

## 2024-11-01 ENCOUNTER — Other Ambulatory Visit: Payer: Self-pay

## 2024-11-01 DIAGNOSIS — F321 Major depressive disorder, single episode, moderate: Secondary | ICD-10-CM

## 2024-11-03 ENCOUNTER — Other Ambulatory Visit: Payer: Self-pay | Admitting: Gerontology

## 2024-11-03 ENCOUNTER — Other Ambulatory Visit: Payer: Self-pay

## 2024-11-03 DIAGNOSIS — F321 Major depressive disorder, single episode, moderate: Secondary | ICD-10-CM

## 2024-11-09 ENCOUNTER — Ambulatory Visit: Payer: Self-pay | Admitting: Gerontology
